# Patient Record
Sex: Female | Born: 1940
Health system: Southern US, Community
[De-identification: ages and names within clinical notes are randomized; demographics above are authoritative.]

## PROBLEM LIST (undated history)

## (undated) DIAGNOSIS — E785 Hyperlipidemia, unspecified: Secondary | ICD-10-CM

## (undated) DIAGNOSIS — G009 Bacterial meningitis, unspecified: Secondary | ICD-10-CM

## (undated) DIAGNOSIS — R0683 Snoring: Secondary | ICD-10-CM

## (undated) DIAGNOSIS — R0602 Shortness of breath: Secondary | ICD-10-CM

## (undated) DIAGNOSIS — I10 Essential (primary) hypertension: Secondary | ICD-10-CM

## (undated) DIAGNOSIS — M810 Age-related osteoporosis without current pathological fracture: Secondary | ICD-10-CM

## (undated) DIAGNOSIS — I4719 Other supraventricular tachycardia: Secondary | ICD-10-CM

## (undated) DIAGNOSIS — R200 Anesthesia of skin: Secondary | ICD-10-CM

## (undated) DIAGNOSIS — Z87898 Personal history of other specified conditions: Secondary | ICD-10-CM

## (undated) DIAGNOSIS — Z8719 Personal history of other diseases of the digestive system: Secondary | ICD-10-CM

## (undated) DIAGNOSIS — IMO0002 Reserved for concepts with insufficient information to code with codable children: Secondary | ICD-10-CM

## (undated) DIAGNOSIS — I6523 Occlusion and stenosis of bilateral carotid arteries: Secondary | ICD-10-CM

## (undated) DIAGNOSIS — I480 Paroxysmal atrial fibrillation: Secondary | ICD-10-CM

## (undated) DIAGNOSIS — N200 Calculus of kidney: Secondary | ICD-10-CM

## (undated) DIAGNOSIS — B360 Pityriasis versicolor: Secondary | ICD-10-CM

## (undated) DIAGNOSIS — D126 Benign neoplasm of colon, unspecified: Secondary | ICD-10-CM

## (undated) DIAGNOSIS — G43109 Migraine with aura, not intractable, without status migrainosus: Secondary | ICD-10-CM

## (undated) DIAGNOSIS — Z87448 Personal history of other diseases of urinary system: Secondary | ICD-10-CM

## (undated) DIAGNOSIS — M2550 Pain in unspecified joint: Secondary | ICD-10-CM

## (undated) DIAGNOSIS — G4733 Obstructive sleep apnea (adult) (pediatric): Secondary | ICD-10-CM

## (undated) DIAGNOSIS — R569 Unspecified convulsions: Secondary | ICD-10-CM

## (undated) DIAGNOSIS — I471 Supraventricular tachycardia: Secondary | ICD-10-CM

## (undated) HISTORY — PX: CHOLECYSTECTOMY: SHX55

## (undated) HISTORY — DX: Occlusion and stenosis of bilateral carotid arteries: I65.23

## (undated) HISTORY — DX: Calculus of kidney: N20.0

## (undated) HISTORY — DX: Reserved for concepts with insufficient information to code with codable children: IMO0002

## (undated) HISTORY — DX: Hyperlipidemia, unspecified: E78.5

## (undated) HISTORY — DX: Pityriasis versicolor: B36.0

## (undated) HISTORY — DX: Paroxysmal atrial fibrillation: I48.0

## (undated) HISTORY — DX: Supraventricular tachycardia: I47.1

## (undated) HISTORY — DX: Bacterial meningitis, unspecified: G00.9

## (undated) HISTORY — DX: Migraine with aura, not intractable, without status migrainosus: G43.109

## (undated) HISTORY — DX: Snoring: R06.83

## (undated) HISTORY — DX: Personal history of other diseases of urinary system: Z87.448

## (undated) HISTORY — DX: Pain in unspecified joint: M25.50

## (undated) HISTORY — DX: Personal history of other diseases of the digestive system: Z87.19

## (undated) HISTORY — DX: Unspecified convulsions: R56.9

## (undated) HISTORY — DX: Obstructive sleep apnea (adult) (pediatric): G47.33

## (undated) HISTORY — DX: Age-related osteoporosis without current pathological fracture: M81.0

## (undated) HISTORY — DX: Other supraventricular tachycardia: I47.19

## (undated) HISTORY — DX: Anesthesia of skin: R20.0

## (undated) HISTORY — PX: ABLATION: SHX5711

## (undated) HISTORY — DX: Shortness of breath: R06.02

## (undated) HISTORY — DX: Essential (primary) hypertension: I10

## (undated) HISTORY — PX: OTHER SURGICAL HISTORY: SHX169

## (undated) HISTORY — DX: Personal history of other specified conditions: Z87.898

## (undated) HISTORY — DX: Benign neoplasm of colon, unspecified: D12.6

---

## 1978-07-18 DIAGNOSIS — R569 Unspecified convulsions: Secondary | ICD-10-CM

## 1978-07-18 HISTORY — DX: Unspecified convulsions: R56.9

## 1998-05-14 ENCOUNTER — Ambulatory Visit (HOSPITAL_COMMUNITY): Admission: RE | Admit: 1998-05-14 | Discharge: 1998-05-14 | Payer: Self-pay | Admitting: Urology

## 1998-05-14 ENCOUNTER — Encounter: Payer: Self-pay | Admitting: Urology

## 2009-11-16 ENCOUNTER — Inpatient Hospital Stay (HOSPITAL_BASED_OUTPATIENT_CLINIC_OR_DEPARTMENT_OTHER): Admission: RE | Admit: 2009-11-16 | Discharge: 2009-11-16 | Payer: Self-pay | Admitting: Cardiology

## 2010-11-16 DIAGNOSIS — R200 Anesthesia of skin: Secondary | ICD-10-CM

## 2010-11-16 HISTORY — DX: Anesthesia of skin: R20.0

## 2010-12-10 ENCOUNTER — Ambulatory Visit (HOSPITAL_COMMUNITY): Payer: MEDICARE | Attending: Internal Medicine

## 2010-12-10 DIAGNOSIS — R209 Unspecified disturbances of skin sensation: Secondary | ICD-10-CM | POA: Insufficient documentation

## 2010-12-15 ENCOUNTER — Ambulatory Visit (HOSPITAL_BASED_OUTPATIENT_CLINIC_OR_DEPARTMENT_OTHER)
Admission: RE | Admit: 2010-12-15 | Discharge: 2010-12-15 | Disposition: A | Discharge status: Home / Self Care | Payer: MEDICARE | Source: Ambulatory Visit | Attending: Urology | Admitting: Urology

## 2010-12-15 ENCOUNTER — Ambulatory Visit (HOSPITAL_COMMUNITY): Payer: MEDICARE

## 2010-12-15 DIAGNOSIS — I4891 Unspecified atrial fibrillation: Secondary | ICD-10-CM | POA: Insufficient documentation

## 2010-12-15 DIAGNOSIS — I10 Essential (primary) hypertension: Secondary | ICD-10-CM | POA: Insufficient documentation

## 2010-12-15 DIAGNOSIS — E785 Hyperlipidemia, unspecified: Secondary | ICD-10-CM | POA: Insufficient documentation

## 2010-12-15 DIAGNOSIS — I771 Stricture of artery: Secondary | ICD-10-CM | POA: Insufficient documentation

## 2010-12-15 DIAGNOSIS — N201 Calculus of ureter: Secondary | ICD-10-CM | POA: Insufficient documentation

## 2010-12-15 DIAGNOSIS — R209 Unspecified disturbances of skin sensation: Secondary | ICD-10-CM | POA: Insufficient documentation

## 2010-12-15 DIAGNOSIS — Z7982 Long term (current) use of aspirin: Secondary | ICD-10-CM | POA: Insufficient documentation

## 2010-12-15 DIAGNOSIS — Z79899 Other long term (current) drug therapy: Secondary | ICD-10-CM | POA: Insufficient documentation

## 2010-12-15 LAB — POCT I-STAT 4, (NA,K, GLUC, HGB,HCT)
Glucose, Bld: 87 mg/dL (ref 70–99)
HCT: 43 % (ref 36.0–46.0)
Hemoglobin: 14.6 g/dL (ref 12.0–15.0)
Potassium: 4.5 mEq/L (ref 3.5–5.1)
Sodium: 142 mEq/L (ref 135–145)

## 2010-12-17 NOTE — Op Note (Signed)
  NAMEBEATRIS, Rhonda [name]               ACCOUNT NO.:  [id number]  MEDICAL RECORD NO.:  [id number]            PATIENT TYPE:  LOCATION:                                 FACILITY:  PHYSICIAN:  Brinden Kincheloe I. Patsi Sears, M.D.DATE OF BIRTH:  1941-07-03  DATE OF PROCEDURE:  12/15/2010 DATE OF DISCHARGE:                              OPERATIVE REPORT   PREOPERATIVE DIAGNOSIS:  Left ureteropelvic junction stone.  POSTOPERATIVE DIAGNOSIS:  Left mid-caliceal stone.  OPERATION:  Cystourethroscopy, left retrograde pyelogram with interpretation, left ureteral dilation and flexible ureteroscopy and basket extraction of mid-caliceal stone and left double-J stent (6- French x 24 cm).  HISTORY:  [name] is a 70 year old female, with microhematuria, and left UPJ stone which has not passed.  She is now for extraction.  PROCEDURE:  Cystourethroscopy was accomplished and left retrograde pyelogram was accomplished, which showed the stone at the left UPJ.  The stone was manipulated back into the mid pole calix.  Ureteral dilation was accomplished, and through the dilator and access sheath, the flexible digital scope was passed, and stone was identified in the mid calix and extracted.  Evaluation of all of the calices was accomplished, no stone was identified.  There was a lower pole calix with bleeding, but no stone was identified.  A double-J stent was then placed over guidewire, measuring 6-French x 24 cm.  The patient tolerated procedure well.  She received a B and O suppository at the beginning of procedure, and IV Toradol at the end of the procedure.  She was awakened, taken to recovery room in excellent condition.     [name] I. Patsi Sears, M.D.     SIT/MEDQ  D:  12/15/2010  T:  12/16/2010  Job:  [id number]  Electronically Signed by Jethro Bolus M.D. on 12/17/2010 01:24:06 PM

## 2013-01-16 ENCOUNTER — Other Ambulatory Visit: Payer: Self-pay | Admitting: Internal Medicine

## 2013-01-16 DIAGNOSIS — H532 Diplopia: Secondary | ICD-10-CM

## 2013-01-22 ENCOUNTER — Ambulatory Visit
Admission: RE | Admit: 2013-01-22 | Discharge: 2013-01-22 | Disposition: A | Discharge status: Home / Self Care | Payer: Self-pay | Source: Ambulatory Visit | Attending: Internal Medicine | Admitting: Internal Medicine

## 2013-01-22 DIAGNOSIS — H532 Diplopia: Secondary | ICD-10-CM

## 2014-01-01 ENCOUNTER — Encounter: Payer: Self-pay | Admitting: General Surgery

## 2014-01-01 ENCOUNTER — Ambulatory Visit: Payer: MEDICARE | Admitting: Cardiology

## 2014-01-01 DIAGNOSIS — I10 Essential (primary) hypertension: Secondary | ICD-10-CM | POA: Insufficient documentation

## 2014-01-01 DIAGNOSIS — I119 Hypertensive heart disease without heart failure: Secondary | ICD-10-CM

## 2014-01-01 DIAGNOSIS — R0602 Shortness of breath: Secondary | ICD-10-CM

## 2014-01-01 DIAGNOSIS — I4891 Unspecified atrial fibrillation: Secondary | ICD-10-CM

## 2014-01-01 DIAGNOSIS — I4819 Other persistent atrial fibrillation: Secondary | ICD-10-CM | POA: Insufficient documentation

## 2014-01-15 ENCOUNTER — Ambulatory Visit (INDEPENDENT_AMBULATORY_CARE_PROVIDER_SITE_OTHER): Payer: Medicare Other | Admitting: Cardiology

## 2014-01-15 ENCOUNTER — Encounter: Payer: Self-pay | Admitting: Cardiology

## 2014-01-15 VITALS — BP 130/82 | HR 62 | Ht 66.0 in | Wt 186.0 lb

## 2014-01-15 DIAGNOSIS — I10 Essential (primary) hypertension: Secondary | ICD-10-CM

## 2014-01-15 DIAGNOSIS — I480 Paroxysmal atrial fibrillation: Secondary | ICD-10-CM

## 2014-01-15 DIAGNOSIS — I471 Supraventricular tachycardia: Secondary | ICD-10-CM | POA: Insufficient documentation

## 2014-01-15 DIAGNOSIS — R0602 Shortness of breath: Secondary | ICD-10-CM

## 2014-01-15 DIAGNOSIS — R0789 Other chest pain: Secondary | ICD-10-CM

## 2014-01-15 DIAGNOSIS — I4891 Unspecified atrial fibrillation: Secondary | ICD-10-CM

## 2014-01-15 NOTE — Patient Instructions (Signed)
Your physician has recommended you make the following change in your medication: 1. D/C Lisinopril  Your physician has requested that you have an exercise tolerance test. For further information please visit [email or web address]. Please also follow instruction sheet, as given.  Your physician recommends that you schedule a follow-up appointment in: 6 Weeks with Dr Radford Pax

## 2014-01-15 NOTE — Progress Notes (Signed)
[place], [place], [place]  [address] Phone: [phone] Fax:  [phone]  Date:  01/15/2014   ID:  MORAYO LEVEN, DOB 1941-07-08, MRN [id number]  PCP:  Irven Shelling, MD  Cardiologist:  Fransico Him, MD     History of Present Illness: Rhonda [name] is a 73 y.o. female with a history of PAF s/p afib ablation, HTN, PVC's, PAC's and nonsustained atrial tachycardia who presents toady for followup.  She is doing well.  She denies any LE edema, palpitations or syncope.  She says that her BP has been up and down over the past few weeks.  She is concerned that she has been having some intermittent left arm pain.  It typically occurs at rest but is not exacerbated by movement.  She has had several episodes of SOB that are usually exacerbated when her BP gets too low.  She has had some problems with lightheadedness recently.  She brought in BP readings today which range from 92-150/60-7mmHg.     Wt Readings from Last 3 Encounters:  01/15/14 186 lb (84.369 kg)     Past Medical History  Diagnosis Date  . Hypertension   . PAF (paroxysmal atrial fibrillation)     radiofrequency ablation of afib bowman gray  . Hyperlipidemia   . Basilar migraine   . Atrial tachycardia     non sustatined with PVCs and PACs  . Osteoporosis     actonel since 2006  . Hx of chest pain     Atypical  . SOB (shortness of breath)     Chronic mild, negative cardiolite  . Seizures 1980    7 years on Dilantin  . History of hemorrhoids   . Snoring   . Tinea versicolor   . Nephrolithiasis   . Hx of hematuria     IVP 1990 left kidney stone, cystoscopy negative  . Cystocele     MIld  . Bacterial meningitis     as a child  . Adenomatous colon polyp     cecum, last colonoscopy 2006  . Right facial numbness 5/12    question TIA versus migraine,cardiac monitoring neg for AFIB    Current Outpatient Prescriptions  Medication Sig Dispense Refill  . acetaminophen (TYLENOL) 325 MG tablet  Take by mouth.      . ALPRAZolam (XANAX) 0.5 MG tablet Take 0.5 mg by mouth at bedtime as needed for anxiety.      Marland Kitchen aspirin 81 MG tablet Take 81 mg by mouth daily.      . calcium citrate-vitamin D (CITRACAL+D) 315-200 MG-UNIT per tablet Take by mouth.      . cycloSPORINE (RESTASIS) 0.05 % ophthalmic emulsion 1 drop Two (2) times a day.      . lisinopril (PRINIVIL,ZESTRIL) 2.5 MG tablet Take 2.5 mg by mouth daily.      Marland Kitchen omeprazole (PRILOSEC OTC) 20 MG tablet Take 20 mg by mouth as needed.      . ranitidine (ZANTAC) 150 MG tablet Take 150 mg by mouth 2 (two) times daily.       No current facility-administered medications for this visit.    Allergies:    Allergies  Allergen Reactions  . Erythromycin     Yeast infection    Social History:  The patient  reports that she has never smoked. She does not have any smokeless tobacco history on file. She reports that she drinks alcohol. She reports that she does not use illicit drugs.  Family History:  The patient's family history is not on file.   ROS:  Please see the history of present illness.      All other systems reviewed and negative.   PHYSICAL EXAM: VS:  BP 130/82  Pulse 62  Ht 5\' 6"  (1.676 m)  Wt 186 lb (84.369 kg)  BMI 30.04 kg/m2 Well nourished, well developed, in no acute distress HEENT: normal Neck: no JVD Cardiac:  normal S1, S2; RRR; no murmur Lungs:  clear to auscultation bilaterally, no wheezing, rhonchi or rales Abd: soft, nontender, no hepatomegaly Ext: no edema Skin: warm and dry Neuro:  CNs 2-12 intact, no focal abnormalities noted  EKG:     NSR  ASSESSMENT AND PLAN:  1. PAF s/p afib ablation with no reoccurence - continue ASA 2. PVC's 3. PAC's 4. Nonsustained atrial tacycardia 5. HTN well controlled and now with some low BP readings with dizziness - I will stop her Lisinopril 6.  Atypical symptoms of chest pain and SOB that only occurs at rest - unlikely to be related to CAD but since she is having  left arm pain intermittently I will get an ETT.  She had a nuclear stress test a year ago that was normal and her EKG is normal.     Followup with me in 6 weeks  Signed, Fransico Him, MD 01/15/2014 11:49 AM

## 2014-01-22 ENCOUNTER — Encounter: Payer: Self-pay | Admitting: Cardiology

## 2014-01-23 ENCOUNTER — Other Ambulatory Visit: Payer: Self-pay | Admitting: Internal Medicine

## 2014-01-23 DIAGNOSIS — H532 Diplopia: Secondary | ICD-10-CM

## 2014-01-23 DIAGNOSIS — I6350 Cerebral infarction due to unspecified occlusion or stenosis of unspecified cerebral artery: Secondary | ICD-10-CM

## 2014-01-30 ENCOUNTER — Ambulatory Visit
Admission: RE | Admit: 2014-01-30 | Discharge: 2014-01-30 | Disposition: A | Discharge status: Home / Self Care | Payer: Medicare Other | Source: Ambulatory Visit | Attending: Internal Medicine | Admitting: Internal Medicine

## 2014-01-30 DIAGNOSIS — I6350 Cerebral infarction due to unspecified occlusion or stenosis of unspecified cerebral artery: Secondary | ICD-10-CM

## 2014-01-30 DIAGNOSIS — H532 Diplopia: Secondary | ICD-10-CM

## 2014-02-19 ENCOUNTER — Ambulatory Visit (INDEPENDENT_AMBULATORY_CARE_PROVIDER_SITE_OTHER): Payer: Medicare Other | Admitting: Nurse Practitioner

## 2014-02-19 VITALS — BP 125/72 | HR 74

## 2014-02-19 DIAGNOSIS — R0789 Other chest pain: Secondary | ICD-10-CM

## 2014-02-19 NOTE — Progress Notes (Signed)
Exercise Treadmill Test  Pre-Exercise Testing Evaluation Rhythm: normal sinus  Rate: 67 bpm     Test  Exercise Tolerance Test Ordering MD: Fransico Him, MD  Interpreting MD: Truitt Merle, NP  Unique Test No: 1  Treadmill:  1  Indication for ETT: chest pain - rule out ischemia  Contraindication to ETT: No   Stress Modality: exercise - treadmill  Cardiac Imaging Performed: non   Protocol: standard Bruce - maximal  Max BP:  162/93  Max MPHR (bpm):  149 85% MPR (bpm):  127  MPHR obtained (bpm):  100 % MPHR obtained:  78%  Reached 85% MPHR (min:sec):  NA Total Exercise Time (min-sec):  4:51  Workload in METS:  6.5 Borg Scale: 15  Reason ETT Terminated:  patient's desire to stop    ST Segment Analysis At Rest: normal ST segments - no evidence of significant ST depression With Exercise: no evidence of significant ST depression  Other Information Arrhythmia:  No Angina during ETT:  absent (0) Quality of ETT:  non-diagnostic  ETT Interpretation:  Non-diagnostic.  Comments: Patient presents today for routine GXT. Has had some atypical chest/left arm pain. Has HTN with labile readings, HLD and positive FH. Negative Myoview in June 2014. Does not exercise routinely but has tried to start walking more recently without any symptoms. Currently with URI.  Today the patient exercised on a modified Bruce protocol for a total of 5:41 minutes.  Poor exercise tolerance.  Adequate blood pressure response.  Clinically negative for chest pain. Test was stopped due to fatigue.  EKG negative for ischemia but the study is non-diagnostic due to target HR not being achieved. No significant arrhythmia noted.   Recommendations: Walking at home daily.  See back later this month as planned.  Will hold on Myoview until seen back by Dr. Radford Pax or if she has return of symptoms.   Patient is agreeable to this plan and will call if any problems develop in the interim.   Burtis Junes, RN, [facility] [address] [place], [place]  [address] [phone]

## 2014-03-13 ENCOUNTER — Encounter: Payer: Self-pay | Admitting: Cardiology

## 2014-03-13 ENCOUNTER — Ambulatory Visit (INDEPENDENT_AMBULATORY_CARE_PROVIDER_SITE_OTHER): Payer: Medicare Other | Admitting: Cardiology

## 2014-03-13 VITALS — BP 114/76 | HR 73 | Ht 66.0 in | Wt 176.8 lb

## 2014-03-13 DIAGNOSIS — R0789 Other chest pain: Secondary | ICD-10-CM

## 2014-03-13 DIAGNOSIS — I10 Essential (primary) hypertension: Secondary | ICD-10-CM

## 2014-03-13 DIAGNOSIS — I4891 Unspecified atrial fibrillation: Secondary | ICD-10-CM

## 2014-03-13 DIAGNOSIS — I480 Paroxysmal atrial fibrillation: Secondary | ICD-10-CM

## 2014-03-13 NOTE — Patient Instructions (Signed)
Your physician recommends that you continue on your current medications as directed. Please refer to the Current Medication list given to you today.  Your physician wants you to follow-up in: 6 months with Dr Turner You will receive a reminder letter in the mail two months in advance. If you don't receive a letter, please call our office to schedule the follow-up appointment.  

## 2014-03-13 NOTE — Progress Notes (Signed)
[place], [place], [place]  [address] Phone: [phone] Fax:  [phone]  Date:  03/13/2014   ID:  Rhonda [name], DOB 1940-10-31, MRN [id number]  PCP:  Irven Shelling, MD  Cardiologist:  Fransico Him, MD     History of Present Illness:  Rhonda [name] is a 73 y.o. female with a history of PAF s/p afib ablation, HTN, PVC's, PAC's and nonsustained atrial tachycardia who presents toady for followup. She is doing well. She denies any LE edema, palpitations or syncope. Since I saw her last she has lost 10 pounds.  She denies any chest pain, SOB or DOE.  She walks on the treadmill for 10 minutes and bike for 10 minutes due to knee pain.   Wt Readings from Last 3 Encounters:  03/13/14 176 lb 12.8 oz (80.196 kg)  01/15/14 186 lb (84.369 kg)     Past Medical History  Diagnosis Date  . Hypertension   . PAF (paroxysmal atrial fibrillation)     radiofrequency ablation of afib bowman gray  . Hyperlipidemia   . Basilar migraine   . Atrial tachycardia     non sustatined with PVCs and PACs  . Osteoporosis     actonel since 2006  . Hx of chest pain     Atypical  . SOB (shortness of breath)     Chronic mild, negative cardiolite  . Seizures 1980    7 years on Dilantin  . History of hemorrhoids   . Snoring   . Tinea versicolor   . Nephrolithiasis   . Hx of hematuria     IVP 1990 left kidney stone, cystoscopy negative  . Cystocele     MIld  . Bacterial meningitis     as a child  . Adenomatous colon polyp     cecum, last colonoscopy 2006  . Right facial numbness 5/12    question TIA versus migraine,cardiac monitoring neg for AFIB    Current Outpatient Prescriptions  Medication Sig Dispense Refill  . acetaminophen (TYLENOL) 325 MG tablet Take by mouth.      . ALPRAZolam (XANAX) 0.5 MG tablet Take 0.5 mg by mouth at bedtime as needed for anxiety. USES FOR SLEEP      . aspirin 81 MG tablet Take 81 mg by mouth daily.      . calcium citrate-vitamin D  (CITRACAL+D) 315-200 MG-UNIT per tablet Take by mouth.      . cycloSPORINE (RESTASIS) 0.05 % ophthalmic emulsion 1 drop Two (2) times a day.      . DiphenhydrAMINE HCl (BENADRYL PO) Take by mouth. FOR SLEEP      . Multiple Vitamin (MULTIVITAMIN) tablet Take 1 tablet by mouth daily. With vitamin D      . omeprazole (PRILOSEC OTC) 20 MG tablet Take 20 mg by mouth as needed.      . pravastatin (PRAVACHOL) 40 MG tablet Take 40 mg by mouth daily.      . Pseudoephedrine HCl (SUDAFED PO) Take by mouth as needed. For Allergies      . ranitidine (ZANTAC) 150 MG tablet Take 150 mg by mouth 2 (two) times daily.       No current facility-administered medications for this visit.    Allergies:    Allergies  Allergen Reactions  . Erythromycin     Yeast infection    Social History:  The patient  reports that she has never smoked. She does not have any smokeless tobacco history on file. She  reports that she drinks alcohol. She reports that she does not use illicit drugs.   Family History:  The patient's family history is not on file.   ROS:  Please see the history of present illness.      All other systems reviewed and negative.   PHYSICAL EXAM: VS:  BP 114/76  Pulse 73  Ht 5\' 6"  (1.676 m)  Wt 176 lb 12.8 oz (80.196 kg)  BMI 28.55 kg/m2  SpO2 98% Well nourished, well developed, in no acute distress HEENT: normal Neck: no JVD Cardiac:  normal S1, S2; RRR; no murmur Lungs:  clear to auscultation bilaterally, no wheezing, rhonchi or rales Abd: soft, nontender, no hepatomegaly Ext: no edema Skin: warm and dry Neuro:  CNs 2-12 intact, no focal abnormalities noted  ASSESSMENT AND PLAN:  1. PAF s/p afib ablation with no reoccurence - continue ASA  2. PVC's 3. PAC's 4. Nonsustained atrial tachycardia  5. HTN well controlled off Lisinopril       6.   Atypical symptoms of chest pain and SOB with no ischemia on  ETT but patient did not achieve her maximum 85% pred HR target.  Her symptoms of  chest pain and SOB have resolved with weight loss.  No further w/u at this time unless she starts to have CP or SOB.  Followup with me in 6 months       Signed, Fransico Him, MD 03/13/2014 2:55 PM

## 2014-03-14 ENCOUNTER — Ambulatory Visit: Payer: Medicare Other | Admitting: Cardiology

## 2014-07-08 ENCOUNTER — Encounter: Payer: Self-pay | Admitting: Cardiology

## 2014-09-09 ENCOUNTER — Ambulatory Visit (INDEPENDENT_AMBULATORY_CARE_PROVIDER_SITE_OTHER): Payer: Medicare Other | Admitting: Cardiology

## 2014-09-09 ENCOUNTER — Encounter: Payer: Self-pay | Admitting: Cardiology

## 2014-09-09 VITALS — BP 110/68 | HR 74 | Ht 66.0 in | Wt 175.4 lb

## 2014-09-09 DIAGNOSIS — I10 Essential (primary) hypertension: Secondary | ICD-10-CM

## 2014-09-09 DIAGNOSIS — I471 Supraventricular tachycardia: Secondary | ICD-10-CM

## 2014-09-09 DIAGNOSIS — I480 Paroxysmal atrial fibrillation: Secondary | ICD-10-CM

## 2014-09-09 NOTE — Patient Instructions (Signed)
Your physician recommends that you continue on your current medications as directed. Please refer to the Current Medication list given to you today.  Your physician wants you to follow-up in: 6 months with Dr Turner You will receive a reminder letter in the mail two months in advance. If you don't receive a letter, please call our office to schedule the follow-up appointment.  

## 2014-09-09 NOTE — Progress Notes (Signed)
Cardiology Office Note   Date:  09/09/2014   ID:  Rhonda [name], DOB [birth year], MRN [id number]  PCP:  Irven Shelling, MD  Cardiologist:   Sueanne Margarita, MD   No chief complaint on file.     History of Present Illness: Rhonda [name] is a 74 y.o. female with a history of PAF s/p afib ablation, HTN, PVC's, PAC's and nonsustained atrial tachycardia who presents toady for followup. She is doing well. She denies any LE edema, dizziness or syncope.  She denies any chest pain, SOB or DOE. She occasionally has a skipped heart beat.  She used to walks on the treadmill for 10 minutes and bike for 10 minutes but got busy over the holidays and stopped and is planning on getting back into exercise.  Past Medical History  Diagnosis Date  . Hypertension   . PAF (paroxysmal atrial fibrillation)     radiofrequency ablation of afib bowman gray  . Hyperlipidemia   . Basilar migraine   . Atrial tachycardia     non sustatined with PVCs and PACs  . Osteoporosis     actonel since 2006  . Hx of chest pain     Atypical  . SOB (shortness of breath)     Chronic mild, negative cardiolite  . Seizures 1980    7 years on Dilantin  . History of hemorrhoids   . Snoring   . Tinea versicolor   . Nephrolithiasis   . Hx of hematuria     IVP 1990 left kidney stone, cystoscopy negative  . Cystocele     MIld  . Bacterial meningitis     as a child  . Adenomatous colon polyp     cecum, last colonoscopy 2006  . Right facial numbness 5/12    question TIA versus migraine,cardiac monitoring neg for AFIB    Past Surgical History  Procedure Laterality Date  . Multiple ankle surgeries       Current Outpatient Prescriptions  Medication Sig Dispense Refill  . acetaminophen (TYLENOL) 325 MG tablet Take by mouth.    . ALPRAZolam (XANAX) 0.5 MG tablet Take 0.5 mg by mouth at bedtime as needed for anxiety. USES FOR SLEEP    . aspirin 81 MG tablet Take 81 mg by mouth daily.    . calcium  citrate-vitamin D (CITRACAL+D) 315-200 MG-UNIT per tablet Take by mouth.    . cycloSPORINE (RESTASIS) 0.05 % ophthalmic emulsion 1 drop Two (2) times a day.    . DiphenhydrAMINE HCl (BENADRYL PO) Take by mouth. FOR SLEEP    . Multiple Vitamin (MULTIVITAMIN) tablet Take 1 tablet by mouth daily. With vitamin D    . omeprazole (PRILOSEC OTC) 20 MG tablet Take 20 mg by mouth as needed.    . pravastatin (PRAVACHOL) 40 MG tablet Take 40 mg by mouth daily.    . Pseudoephedrine HCl (SUDAFED PO) Take by mouth as needed. For Allergies    . ranitidine (ZANTAC) 150 MG tablet Take 150 mg by mouth 2 (two) times daily.     No current facility-administered medications for this visit.    Allergies:   Erythromycin    Social History:  The patient  reports that she has never smoked. She does not have any smokeless tobacco history on file. She reports that she drinks alcohol. She reports that she does not use illicit drugs.   Family History:  The patient's family history includes Heart attack in her father; Parkinson's disease in her mother.  ROS:  Please see the history of present illness.   Otherwise, review of systems are positive for none.   All other systems are reviewed and negative.    PHYSICAL EXAM: VS:  BP 110/68 mmHg  Pulse 74  Ht 5\' 6"  (1.676 m)  Wt 175 lb 6.4 oz (79.561 kg)  BMI 28.32 kg/m2  SpO2 99% , BMI Body mass index is 28.32 kg/(m^2). GEN: Well nourished, well developed, in no acute distress HEENT: normal Neck: no JVD, carotid bruits, or masses Cardiac: RRR; no murmurs, rubs, or gallops,no edema  Respiratory:  clear to auscultation bilaterally, normal work of breathing GI: soft, nontender, nondistended, + BS MS: no deformity or atrophy Skin: warm and dry, no rash Neuro:  Strength and sensation are intact Psych: euthymic mood, full affect   EKG:  EKG is not ordered today.    Recent Labs: No results found for requested labs within last 365 days.    Lipid Panel No  results found for: CHOL, TRIG, HDL, CHOLHDL, VLDL, LDLCALC, LDLDIRECT    Wt Readings from Last 3 Encounters:  09/09/14 175 lb 6.4 oz (79.561 kg)  03/13/14 176 lb 12.8 oz (80.196 kg)  01/15/14 186 lb (84.369 kg)    ASSESSMENT AND PLAN:  1. PAF s/p afib ablation with no reoccurence - continue ASA  2. PVC's 3. PAC's 4. Nonsustained atrial tachycardia 5. HTN well controlled    Current medicines are reviewed at length with the patient today.  The patient does not have concerns regarding medicines.  The following changes have been made:  no change  Labs/ tests ordered today include: None  No orders of the defined types were placed in this encounter.     Disposition:   FU with me in 6 months   Signed, Sueanne Margarita, MD  09/09/2014 11:48 AM    [facility] [place], [place], [place]  [address] Phone: [phone]; Fax: [phone]

## 2014-12-18 ENCOUNTER — Encounter: Payer: Self-pay | Admitting: Cardiology

## 2015-03-23 NOTE — Progress Notes (Signed)
Cardiology Office Note   Date:  03/24/2015   ID:  Rhonda [name], DOB 1941-03-20, MRN [id number]  PCP:  Irven Shelling, MD    Chief Complaint  Patient presents with  . PAF      History of Present Illness: Rhonda [name] is a 74 y.o. female with a history of PAF s/p afib ablation, HTN, PVC's, PAC's and nonsustained atrial tachycardia who presents today for followup. She is doing well. She denies any LE edema, or syncope. She denies any SOB or DOE except when she is having sinus problems.   She occasionally has a skipped heart beat. About 4 months ago she awakened with some hard chest pressure that lasted about 15 minutes and resolved and she developed afib that lasted for about 12 hours. She had some anxiety associated with it and thinks she got sweaty.  She has not had any since then.  She gets dizzy when she stands up some and thinks it has gotten worse since then. SHe has had this for years but thinks it has gotten worse.  She says that this is not vertigo.    Past Medical History  Diagnosis Date  . Hypertension   . PAF (paroxysmal atrial fibrillation)     radiofrequency ablation of afib bowman gray  . Hyperlipidemia   . Basilar migraine   . Atrial tachycardia     non sustatined with PVCs and PACs  . Osteoporosis     actonel since 2006  . Hx of chest pain     Atypical  . SOB (shortness of breath)     Chronic mild, negative cardiolite  . Seizures 1980    7 years on Dilantin  . History of hemorrhoids   . Snoring   . Tinea versicolor   . Nephrolithiasis   . Hx of hematuria     IVP 1990 left kidney stone, cystoscopy negative  . Cystocele     MIld  . Bacterial meningitis     as a child  . Adenomatous colon polyp     cecum, last colonoscopy 2006  . Right facial numbness 5/12    question TIA versus migraine,cardiac monitoring neg for AFIB    Past Surgical History  Procedure Laterality Date  . Multiple ankle surgeries       Current  Outpatient Prescriptions  Medication Sig Dispense Refill  . acetaminophen (TYLENOL) 325 MG tablet Take 650 mg by mouth every 6 (six) hours as needed for mild pain.    Marland Kitchen ALPRAZolam (XANAX) 0.5 MG tablet Take 0.5 mg by mouth at bedtime as needed for anxiety. USES FOR SLEEP    . aspirin 81 MG tablet Take 81 mg by mouth daily.    . calcium citrate-vitamin D (CITRACAL+D) 315-200 MG-UNIT per tablet Take 1 tablet by mouth daily.    . cycloSPORINE (RESTASIS) 0.05 % ophthalmic emulsion Place 1 drop into both eyes 2 (two) times daily.    . DiphenhydrAMINE HCl (BENADRYL PO) Take by mouth. FOR SLEEP    . doxycycline (VIBRAMYCIN) 100 MG capsule Take 100 mg by mouth 2 (two) times daily.    Marland Kitchen ibuprofen (ADVIL,MOTRIN) 200 MG tablet Take 400 mg by mouth at bedtime.    . Multiple Vitamin (MULTIVITAMIN) tablet Take 1 tablet by mouth daily. With vitamin D    . omeprazole (PRILOSEC OTC) 20 MG tablet Take 20 mg by mouth as needed.    Marland Kitchen  pravastatin (PRAVACHOL) 40 MG tablet Take 40 mg by mouth daily.    . Pseudoephedrine HCl (SUDAFED PO) Take by mouth as needed. For Allergies    . ranitidine (ZANTAC) 150 MG tablet Take 150 mg by mouth 2 (two) times daily.     No current facility-administered medications for this visit.    Allergies:   Erythromycin    Social History:  The patient  reports that she has never smoked. She does not have any smokeless tobacco history on file. She reports that she drinks alcohol. She reports that she does not use illicit drugs.   Family History:  The patient's family history includes Heart attack in her father; Parkinson's disease in her mother.    ROS:  Please see the history of present illness.   Otherwise, review of systems are positive for none.   All other systems are reviewed and negative.    PHYSICAL EXAM: VS:  BP 110/72 mmHg  Pulse 67  Ht 5\' 6"  (1.676 m)  Wt 177 lb 6.4 oz (80.468 kg)  BMI 28.65 kg/m2 , BMI Body mass index is 28.65 kg/(m^2). GEN: Well nourished, well  developed, in no acute distress HEENT: normal Neck: no JVD, carotid bruits, or masses Cardiac: RRR; no murmurs, rubs, or gallops,no edema  Respiratory:  clear to auscultation bilaterally, normal work of breathing GI: soft, nontender, nondistended, + BS MS: no deformity or atrophy Skin: warm and dry, no rash Neuro:  Strength and sensation are intact Psych: euthymic mood, full affect   EKG:  EKG was ordered today and showed NSR with no ST changes    Recent Labs: No results found for requested labs within last 365 days.    Lipid Panel No results found for: CHOL, TRIG, HDL, CHOLHDL, VLDL, LDLCALC, LDLDIRECT    Wt Readings from Last 3 Encounters:  03/24/15 177 lb 6.4 oz (80.468 kg)  09/09/14 175 lb 6.4 oz (79.561 kg)  03/13/14 176 lb 12.8 oz (80.196 kg)     ASSESSMENT AND PLAN:  1. PAF s/p afib ablation with only one episode of palpitations since I saw her last.  I have asked her to let me know if she has palpitations that last longer than a few minutes so we can get an EKG to see if she is having PAF reoccurrence. She does not want to wear a heart monitor at this time since they are very infrequent..  - continue ASA  2. PVC's 3. PAC's 4. Nonsustained atrial tachycardia 5. HTN well controlled    Current medicines are reviewed at length with the patient today.  The patient does not have concerns regarding medicines.  The following changes have been made:  no change  Labs/ tests ordered today: See above Assessment and Plan No orders of the defined types were placed in this encounter.     Disposition:   FU with me in 6 months  Signed, Sueanne Margarita, MD  03/24/2015 10:20 AM    [facility] [place], [place], [place]  [address] Phone: [phone]; Fax: [phone]

## 2015-03-24 ENCOUNTER — Ambulatory Visit (INDEPENDENT_AMBULATORY_CARE_PROVIDER_SITE_OTHER): Payer: Medicare Other | Admitting: Cardiology

## 2015-03-24 ENCOUNTER — Encounter: Payer: Self-pay | Admitting: Cardiology

## 2015-03-24 VITALS — BP 110/72 | HR 67 | Ht 66.0 in | Wt 177.4 lb

## 2015-03-24 DIAGNOSIS — R079 Chest pain, unspecified: Secondary | ICD-10-CM

## 2015-03-24 DIAGNOSIS — I10 Essential (primary) hypertension: Secondary | ICD-10-CM | POA: Diagnosis not present

## 2015-03-24 DIAGNOSIS — I471 Supraventricular tachycardia: Secondary | ICD-10-CM | POA: Diagnosis not present

## 2015-03-24 DIAGNOSIS — I4719 Other supraventricular tachycardia: Secondary | ICD-10-CM

## 2015-03-24 DIAGNOSIS — I480 Paroxysmal atrial fibrillation: Secondary | ICD-10-CM | POA: Diagnosis not present

## 2015-03-24 NOTE — Patient Instructions (Signed)
Medication Instructions:  Your physician recommends that you continue on your current medications as directed. Please refer to the Current Medication list given to you today.   Labwork: None  Testing/Procedures: Your physician has requested that you have a lexiscan myoview. For further information please visit [email or web address]. Please follow instruction sheet, as given.  Follow-Up: Your physician wants you to follow-up in: 6 months with Dr. Turner. You will receive a reminder letter in the mail two months in advance. If you don't receive a letter, please call our office to schedule the follow-up appointment.   Any Other Special Instructions Will Be Listed Below (If Applicable).   

## 2015-04-02 ENCOUNTER — Encounter (HOSPITAL_COMMUNITY): Payer: Medicare Other

## 2015-04-02 ENCOUNTER — Telehealth (HOSPITAL_COMMUNITY): Payer: Self-pay

## 2015-04-02 NOTE — Telephone Encounter (Signed)
Unable to Leave a message on voicemail due to no recorder in reference to upcoming appointment scheduled for 04-07-2015. Phone number given for a call back so details instructions can be given. Oletta Lamas, Anny Sayler A

## 2015-04-06 ENCOUNTER — Telehealth (HOSPITAL_COMMUNITY): Payer: Self-pay | Admitting: *Deleted

## 2015-04-06 NOTE — Telephone Encounter (Signed)
Patient given detailed instructions per Myocardial Perfusion Study Information Sheet for test on 04/07/15 at 0900. Patient notified to arrive 15 minutes early and that it is imperative to arrive on time for appointment to keep from having the test rescheduled.  If you need to cancel or reschedule your appointment, please call the office within 24 hours of your appointment. Failure to do so may result in a cancellation of your appointment, and a $50 no show fee. Patient verbalized understanding. Hasspacher, Ranae Palms

## 2015-04-07 ENCOUNTER — Ambulatory Visit (HOSPITAL_COMMUNITY): Payer: Medicare Other | Attending: Cardiology

## 2015-04-07 DIAGNOSIS — I10 Essential (primary) hypertension: Secondary | ICD-10-CM | POA: Diagnosis not present

## 2015-04-07 DIAGNOSIS — R9439 Abnormal result of other cardiovascular function study: Secondary | ICD-10-CM | POA: Insufficient documentation

## 2015-04-07 DIAGNOSIS — R079 Chest pain, unspecified: Secondary | ICD-10-CM | POA: Diagnosis not present

## 2015-04-07 DIAGNOSIS — R002 Palpitations: Secondary | ICD-10-CM | POA: Diagnosis not present

## 2015-04-07 LAB — MYOCARDIAL PERFUSION IMAGING
CHL CUP NUCLEAR SDS: 2
CHL CUP NUCLEAR SRS: 0
CHL CUP RESTING HR STRESS: 67 {beats}/min
LV dias vol: 90 mL
LV sys vol: 28 mL
NUC STRESS TID: 0.99
Peak HR: 83 {beats}/min
RATE: 0.32
SSS: 2

## 2015-04-07 MED ORDER — TECHNETIUM TC 99M SESTAMIBI GENERIC - CARDIOLITE
32.5000 | Freq: Once | INTRAVENOUS | Status: AC | PRN
  Administered 2015-04-07: 33 via INTRAVENOUS

## 2015-04-07 MED ORDER — REGADENOSON 0.4 MG/5ML IV SOLN
0.4000 mg | Freq: Once | INTRAVENOUS | Status: AC
  Administered 2015-04-07: 0.4 mg via INTRAVENOUS

## 2015-04-07 MED ORDER — TECHNETIUM TC 99M SESTAMIBI GENERIC - CARDIOLITE
10.6000 | Freq: Once | INTRAVENOUS | Status: AC | PRN
  Administered 2015-04-07: 11 via INTRAVENOUS

## 2015-04-09 ENCOUNTER — Encounter: Payer: Self-pay | Admitting: Cardiology

## 2015-04-09 ENCOUNTER — Telehealth: Payer: Self-pay | Admitting: Cardiology

## 2015-04-09 NOTE — Telephone Encounter (Signed)
Informed patient of results and verbal understanding expressed.  

## 2015-04-09 NOTE — Telephone Encounter (Signed)
Pt rtn call re results-pls call [phone]

## 2015-04-09 NOTE — Telephone Encounter (Signed)
-----   Message from Rhonda Margarita, MD sent at 04/07/2015 10:23 PM EDT ----- Please let patient know that stress test was fine

## 2015-04-09 NOTE — Telephone Encounter (Signed)
This encounter was created in error - please disregard.

## 2015-04-09 NOTE — Telephone Encounter (Signed)
New problem    Pt is returning your call from yesterday.

## 2015-10-13 NOTE — Progress Notes (Signed)
Cardiology Office Note   Date:  10/14/2015   ID:  Rhonda [name], DOB 1940/11/01, MRN [id number]  PCP:  Irven Shelling, MD    Chief Complaint  Patient presents with  . Atrial Fibrillation  . Hypertension      History of Present Illness: Rhonda [name] is a 75 y.o. female with a history of PAF s/p afib ablation, HTN, PVC's, PAC's and nonsustained atrial tachycardia who presents today for followup. She is doing well. She denies any LE edema, or syncope. She denies any SOB or DOE. She occasionally has a skipped heart beat or a fluttering. Occasionally she will have some dizziness.    Past Medical History  Diagnosis Date  . Hypertension   . PAF (paroxysmal atrial fibrillation) ([place])     radiofrequency ablation of afib bowman gray  . Hyperlipidemia   . Basilar migraine   . Atrial tachycardia ([place])     non sustatined with PVCs and PACs  . Osteoporosis     actonel since 2006  . Hx of chest pain     Atypical  . SOB (shortness of breath)     Chronic mild, negative cardiolite  . Seizures ([place]) 1980    7 years on Dilantin  . History of hemorrhoids   . Snoring   . Tinea versicolor   . Nephrolithiasis   . Hx of hematuria     IVP 1990 left kidney stone, cystoscopy negative  . Cystocele     MIld  . Bacterial meningitis     as a child  . Adenomatous colon polyp     cecum, last colonoscopy 2006  . Right facial numbness 5/12    question TIA versus migraine,cardiac monitoring neg for AFIB    Past Surgical History  Procedure Laterality Date  . Multiple ankle surgeries       Current Outpatient Prescriptions  Medication Sig Dispense Refill  . acetaminophen (TYLENOL) 325 MG tablet Take 650 mg by mouth every 6 (six) hours as needed for mild pain.    Marland Kitchen ALPRAZolam (XANAX) 0.5 MG tablet Take 0.5 mg by mouth at bedtime as needed for anxiety. USES FOR SLEEP    . aspirin 81 MG tablet Take 81 mg by mouth daily.    . calcium citrate-vitamin D  (CITRACAL+D) 315-200 MG-UNIT per tablet Take 1 tablet by mouth daily.    . cycloSPORINE (RESTASIS) 0.05 % ophthalmic emulsion Place 1 drop into both eyes 2 (two) times daily.    . DiphenhydrAMINE HCl (BENADRYL PO) Take by mouth. FOR SLEEP    . ibuprofen (ADVIL,MOTRIN) 200 MG tablet Take 400 mg by mouth at bedtime.    . Multiple Vitamin (MULTIVITAMIN) tablet Take 1 tablet by mouth daily. With vitamin D    . Naproxen Sodium (ALEVE) 220 MG CAPS Take 220 mg by mouth as needed.    Marland Kitchen omeprazole (PRILOSEC OTC) 20 MG tablet Take 20 mg by mouth as needed.    . pravastatin (PRAVACHOL) 40 MG tablet Take 40 mg by mouth daily.    . Pseudoephedrine HCl (SUDAFED PO) Take by mouth as needed. For Allergies    . ranitidine (ZANTAC) 150 MG tablet Take 150 mg by mouth 2 (two) times daily.     No current facility-administered medications for this visit.    Allergies:   Erythromycin    Social History:  The patient  reports that she has never smoked. She  does not have any smokeless tobacco history on file. She reports that she drinks alcohol. She reports that she does not use illicit drugs.   Family History:  The patient's family history includes Heart attack in her father; Parkinson's disease in her mother.    ROS:  Please see the history of present illness.   Otherwise, review of systems are positive for none.   All other systems are reviewed and negative.    PHYSICAL EXAM: VS:  BP 124/76 mmHg  Pulse 68  Ht 5\' 6"  (1.676 m)  Wt 184 lb (83.462 kg)  BMI 29.71 kg/m2 , BMI Body mass index is 29.71 kg/(m^2). GEN: Well nourished, well developed, in no acute distress HEENT: normal Neck: no JVD, carotid bruits, or masses Cardiac: RRR; no murmurs, rubs, or gallops,no edema  Respiratory:  clear to auscultation bilaterally, normal work of breathing GI: soft, nontender, nondistended, + BS MS: no deformity or atrophy Skin: warm and dry, no rash Neuro:  Strength and sensation are intact Psych: euthymic mood,  full affect   EKG:  EKG is not ordered today.    Recent Labs: No results found for requested labs within last 365 days.    Lipid Panel No results found for: CHOL, TRIG, HDL, CHOLHDL, VLDL, LDLCALC, LDLDIRECT    Wt Readings from Last 3 Encounters:  10/14/15 184 lb (83.462 kg)  03/24/15 177 lb 6.4 oz (80.468 kg)  09/09/14 175 lb 6.4 oz (79.561 kg)      ASSESSMENT AND PLAN:        1.   PAF s/p afib ablation maintaining NSR but having some fluttering in her chest.  She says that there have been a few times where her BP cuff would not register the reading of her HR.  I have recommended that we get a 30 day heart monitor to rule out PAF.   - continue ASA  2. PVC's 3. PAC's 4. Nonsustained atrial tachycardia 5. HTN well controlled   Current medicines are reviewed at length with the patient today.  The patient does not have concerns regarding medicines.  The following changes have been made:  no change  Labs/ tests ordered today: See above Assessment and Plan No orders of the defined types were placed in this encounter.     Disposition:   FU with me in 1 year  Signed, Sueanne Margarita, MD  10/14/2015 11:10 AM    [facility] [place], [place], [place]  [address] Phone: [phone]; Fax: [phone]

## 2015-10-14 ENCOUNTER — Encounter: Payer: Self-pay | Admitting: Cardiology

## 2015-10-14 ENCOUNTER — Ambulatory Visit (INDEPENDENT_AMBULATORY_CARE_PROVIDER_SITE_OTHER): Payer: Medicare Other | Admitting: Cardiology

## 2015-10-14 ENCOUNTER — Encounter (INDEPENDENT_AMBULATORY_CARE_PROVIDER_SITE_OTHER): Payer: Self-pay

## 2015-10-14 VITALS — BP 124/76 | HR 68 | Ht 66.0 in | Wt 184.0 lb

## 2015-10-14 DIAGNOSIS — I471 Supraventricular tachycardia: Secondary | ICD-10-CM | POA: Diagnosis not present

## 2015-10-14 DIAGNOSIS — R002 Palpitations: Secondary | ICD-10-CM | POA: Diagnosis not present

## 2015-10-14 DIAGNOSIS — I4719 Other supraventricular tachycardia: Secondary | ICD-10-CM

## 2015-10-14 DIAGNOSIS — I10 Essential (primary) hypertension: Secondary | ICD-10-CM | POA: Diagnosis not present

## 2015-10-14 DIAGNOSIS — I480 Paroxysmal atrial fibrillation: Secondary | ICD-10-CM | POA: Diagnosis not present

## 2015-10-14 NOTE — Patient Instructions (Signed)
Medication Instructions:  Your physician recommends that you continue on your current medications as directed. Please refer to the Current Medication list given to you today.   Labwork: None  Testing/Procedures: Your physician has recommended that you wear an event monitor. Event monitors are medical devices that record the heart's electrical activity. Doctors most often us these monitors to diagnose arrhythmias. Arrhythmias are problems with the speed or rhythm of the heartbeat. The monitor is a small, portable device. You can wear one while you do your normal daily activities. This is usually used to diagnose what is causing palpitations/syncope (passing out).  Follow-Up: Your physician wants you to follow-up in: 1 year with Dr. Turner. You will receive a reminder letter in the mail two months in advance. If you don't receive a letter, please call our office to schedule the follow-up appointment.   Any Other Special Instructions Will Be Listed Below (If Applicable).     If you need a refill on your cardiac medications before your next appointment, please call your pharmacy.   

## 2015-10-15 ENCOUNTER — Ambulatory Visit (INDEPENDENT_AMBULATORY_CARE_PROVIDER_SITE_OTHER): Payer: Medicare Other

## 2015-10-15 DIAGNOSIS — R002 Palpitations: Secondary | ICD-10-CM | POA: Diagnosis not present

## 2015-11-24 ENCOUNTER — Telehealth: Payer: Self-pay

## 2015-11-24 DIAGNOSIS — I471 Supraventricular tachycardia: Secondary | ICD-10-CM

## 2015-11-24 MED ORDER — METOPROLOL SUCCINATE ER 25 MG PO TB24: 25.0000 mg | ORAL_TABLET | Freq: Every day | ORAL | Status: DC

## 2015-11-24 NOTE — Telephone Encounter (Signed)
Informed patient of results and verbal understanding expressed.  Instructed patient to START TOPROL 25 mg daily and to avoid pseudoephedrine. ECHO ordered for scheduling. Patient agrees with treatment plan.

## 2015-11-24 NOTE — Telephone Encounter (Signed)
-----   Message from Sueanne Margarita, MD sent at 11/22/2015  4:08 PM EDT ----- Heart monitor showed normal rhythm with PACs, PVC's and wide complex tachycardia that I suspect is atrial tachycardia with aberration but cannot rule out VT.  Stress test showed no ischemia 04/2015.  Please get echo to assess for LVF.  Add Toprol XL 25mg  daily.  Tell her to avoid pseudoephedrine for her allergies.

## 2015-12-29 ENCOUNTER — Other Ambulatory Visit: Payer: Self-pay

## 2015-12-29 ENCOUNTER — Ambulatory Visit (HOSPITAL_COMMUNITY): Payer: Medicare Other | Attending: Internal Medicine

## 2015-12-29 DIAGNOSIS — I5189 Other ill-defined heart diseases: Secondary | ICD-10-CM | POA: Diagnosis not present

## 2015-12-29 DIAGNOSIS — I351 Nonrheumatic aortic (valve) insufficiency: Secondary | ICD-10-CM | POA: Insufficient documentation

## 2015-12-29 DIAGNOSIS — R9431 Abnormal electrocardiogram [ECG] [EKG]: Secondary | ICD-10-CM | POA: Diagnosis present

## 2015-12-29 DIAGNOSIS — I071 Rheumatic tricuspid insufficiency: Secondary | ICD-10-CM | POA: Insufficient documentation

## 2015-12-29 DIAGNOSIS — I471 Supraventricular tachycardia: Secondary | ICD-10-CM | POA: Diagnosis not present

## 2015-12-29 DIAGNOSIS — I340 Nonrheumatic mitral (valve) insufficiency: Secondary | ICD-10-CM | POA: Diagnosis not present

## 2015-12-29 DIAGNOSIS — I358 Other nonrheumatic aortic valve disorders: Secondary | ICD-10-CM | POA: Insufficient documentation

## 2015-12-29 LAB — ECHOCARDIOGRAM COMPLETE
AVLVOTPG: 5 mmHg
AVPHT: 645 ms
CHL CUP DOP CALC LVOT VTI: 29.1 cm
CHL CUP TV REG PEAK VELOCITY: 262 cm/s
E decel time: 225 msec
EERAT: 8.03
FS: 29 % (ref 28–44)
IV/PV OW: 1.3
LA diam end sys: 43 mm
LA diam index: 2.23 cm/m2
LA vol index: 32.6 mL/m2
LA vol: 63 mL
LASIZE: 43 mm
LAVOLA4C: 59 mL
LDCA: 3.46 cm2
LV E/e' medial: 8.03
LV PW d: 7.62 mm — AB (ref 0.6–1.1)
LVEEAVG: 8.03
LVELAT: 9.1 cm/s
LVOT SV: 101 mL
LVOT diameter: 21 mm
LVOT peak vel: 117 cm/s
MV Dec: 225
MVPG: 2 mmHg
MVPKAVEL: 63.2 m/s
MVPKEVEL: 73.1 m/s
TDI e' lateral: 9.1
TDI e' medial: 6.47
TRMAXVEL: 262 cm/s

## 2016-09-20 ENCOUNTER — Other Ambulatory Visit: Payer: Self-pay | Admitting: Cardiology

## 2016-10-04 ENCOUNTER — Encounter: Payer: Self-pay | Admitting: Cardiology

## 2016-10-13 ENCOUNTER — Ambulatory Visit (INDEPENDENT_AMBULATORY_CARE_PROVIDER_SITE_OTHER): Payer: Medicare Other | Admitting: Cardiology

## 2016-10-13 ENCOUNTER — Encounter (INDEPENDENT_AMBULATORY_CARE_PROVIDER_SITE_OTHER): Payer: Self-pay

## 2016-10-13 ENCOUNTER — Encounter: Payer: Self-pay | Admitting: Cardiology

## 2016-10-13 VITALS — BP 138/90 | HR 57 | Ht 66.0 in | Wt 189.0 lb

## 2016-10-13 DIAGNOSIS — I4819 Other persistent atrial fibrillation: Secondary | ICD-10-CM

## 2016-10-13 DIAGNOSIS — I10 Essential (primary) hypertension: Secondary | ICD-10-CM | POA: Diagnosis not present

## 2016-10-13 DIAGNOSIS — I471 Supraventricular tachycardia: Secondary | ICD-10-CM | POA: Diagnosis not present

## 2016-10-13 DIAGNOSIS — I6523 Occlusion and stenosis of bilateral carotid arteries: Secondary | ICD-10-CM

## 2016-10-13 DIAGNOSIS — I481 Persistent atrial fibrillation: Secondary | ICD-10-CM

## 2016-10-13 HISTORY — DX: Occlusion and stenosis of bilateral carotid arteries: I65.23

## 2016-10-13 MED ORDER — APIXABAN 5 MG PO TABS: 5.0000 mg | ORAL_TABLET | Freq: Two times a day (BID) | ORAL | 0 refills | Status: DC

## 2016-10-13 MED ORDER — METOPROLOL SUCCINATE ER 25 MG PO TB24: 25.0000 mg | ORAL_TABLET | Freq: Every day | ORAL | 3 refills | Status: DC

## 2016-10-13 NOTE — Patient Instructions (Signed)
Medication Instructions:  1) STOP ASPIRIN  2) You have been given a prescription to START ELIQUIS 5 mg TWICE DAILY. You will start this AFTER your colonoscopy when GI says it is OK.  Labwork: TODAY: BMET, CBC  Testing/Procedures: Your physician has requested that you have a carotid duplex. This test is an ultrasound of the carotid arteries in your neck. It looks at blood flow through these arteries that supply the brain with blood. Allow one hour for this exam. There are no restrictions or special instructions.  Follow-Up: Your physician wants you to follow-up in: 6 months with Dr. Radford Pax. You will receive a reminder letter in the mail two months in advance. If you don't receive a letter, please call our office to schedule the follow-up appointment.   Any Other Special Instructions Will Be Listed Below (If Applicable).     If you need a refill on your cardiac medications before your next appointment, please call your pharmacy.

## 2016-10-13 NOTE — Progress Notes (Signed)
Cardiology Office Note    Date:  10/13/2016   ID:  Rhonda [name], DOB 01/27/1941, MRN [id number]  PCP:  Irven Shelling, MD  Cardiologist:  Fransico Him, MD   Chief Complaint  Patient presents with  . Atrial Fibrillation  . Hypertension    History of Present Illness:  Rhonda [name] is a 76 y.o. female with a history of PAF s/p afib ablation, HTN, bilateral carotid stenosis < 50%, PVC's, PAC's and nonsustained atrial tachycardia who presents today for followup. She is doing well. She denies any LE edema, PND, orthopnea or syncope. She has some mild DOE when working in the yard that has increased some but she attributes it to being sedentary. She complains that occasionally she will have CP in her midsternal area.  This is similar to her CP back in 2016 which was normal and felt to be due to GERD.  It occurs at night and but none during the day when she exerts herself.  This has not changed at all.   She occasionally has a skipped heart beat or a fluttering.    Past Medical History:  Diagnosis Date  . Adenomatous colon polyp    cecum, last colonoscopy 2006  . Atrial tachycardia ([place])    non sustatined with PVCs and PACs  . Bacterial meningitis    as a child  . Basilar migraine   . Carotid stenosis, bilateral 10/13/2016   < 50% by dopplers 2015  . Cystocele    MIld  . History of hemorrhoids   . Hx of chest pain    Atypical  . Hx of hematuria    IVP 1990 left kidney stone, cystoscopy negative  . Hyperlipidemia   . Hypertension   . Nephrolithiasis   . Osteoporosis    actonel since 2006  . PAF (paroxysmal atrial fibrillation) ([place])    radiofrequency ablation of afib bowman gray  . Right facial numbness 5/12   question TIA versus migraine,cardiac monitoring neg for AFIB  . Seizures ([place]) 1980   7 years on Dilantin  . Snoring   . SOB (shortness of breath)    Chronic mild, negative cardiolite  . Tinea versicolor     Past Surgical History:  Procedure  Laterality Date  . multiple ankle surgeries      Current Medications: Current Meds  Medication Sig  . acetaminophen (TYLENOL) 325 MG tablet Take 650 mg by mouth every 6 (six) hours as needed for mild pain.  Marland Kitchen ALPRAZolam (XANAX) 0.5 MG tablet Take 0.5 mg by mouth at bedtime as needed for anxiety. USES FOR SLEEP  . aspirin 81 MG tablet Take 81 mg by mouth daily.  . cycloSPORINE (RESTASIS) 0.05 % ophthalmic emulsion Place 1 drop into both eyes 2 (two) times daily.  . DiphenhydrAMINE HCl (BENADRYL PO) Take by mouth. FOR SLEEP  . ibuprofen (ADVIL,MOTRIN) 200 MG tablet Take 400 mg by mouth at bedtime.  . metoprolol succinate (TOPROL-XL) 25 MG 24 hr tablet TAKE 1 TABLET(25 MG) BY MOUTH DAILY  . Multiple Vitamin (MULTIVITAMIN) tablet Take 1 tablet by mouth daily. With vitamin D  . Naproxen Sodium (ALEVE) 220 MG CAPS Take 220 mg by mouth as needed.  Marland Kitchen omeprazole (PRILOSEC OTC) 20 MG tablet Take 20 mg by mouth as needed.  . pravastatin (PRAVACHOL) 40 MG tablet Take 40 mg by mouth daily.  . Pseudoephedrine HCl (SUDAFED PO) Take by mouth as needed. For Allergies  . ranitidine (ZANTAC) 150 MG tablet Take 150 mg by  mouth 2 (two) times daily.    Allergies:   Erythromycin   Social History   Social History  . Marital status: Married    Spouse name: N/A  . Number of children: N/A  . Years of education: N/A   Social History Main Topics  . Smoking status: Never Smoker  . Smokeless tobacco: Never Used  . Alcohol use Yes     Comment: 1-2 glasses of wine weekly  . Drug use: No  . Sexual activity: Not Asked   Other Topics Concern  . None   Social History Narrative  . None     Family History:  The patient's family history includes Heart attack in her father; Parkinson's disease in her mother.   ROS:   Please see the history of present illness.    ROS All other systems reviewed and are negative.  No flowsheet data found.     PHYSICAL EXAM:   VS:  BP 138/90   Pulse (!) 57   Ht 5\' 6"   (1.676 m)   Wt 189 lb (85.7 kg)   SpO2 97%   BMI 30.51 kg/m    GEN: Well nourished, well developed, in no acute distress  HEENT: normal  Neck: no JVD, carotid bruits, or masses Cardiac: RRR; no murmurs, rubs, or gallops,no edema.  Intact distal pulses bilaterally.  Respiratory:  clear to auscultation bilaterally, normal work of breathing GI: soft, nontender, nondistended, + BS MS: no deformity or atrophy  Skin: warm and dry, no rash Neuro:  Alert and Oriented x 3, Strength and sensation are intact Psych: euthymic mood, full affect  Wt Readings from Last 3 Encounters:  10/13/16 189 lb (85.7 kg)  10/14/15 184 lb (83.5 kg)  03/24/15 177 lb 6.4 oz (80.5 kg)      Studies/Labs Reviewed:   EKG:  EKG is ordered today.  The ekg ordered today demonstrates sinus bradycardia at 55bpm with no ST changes  Recent Labs: No results found for requested labs within last 8760 hours.   Lipid Panel No results found for: CHOL, TRIG, HDL, CHOLHDL, VLDL, LDLCALC, LDLDIRECT  Additional studies/ records that were reviewed today include:  none    ASSESSMENT:    1. Persistent atrial fibrillation ([place])   2. Atrial tachycardia, paroxysmal ([place])   3. Benign essential HTN   4. Carotid stenosis, bilateral      PLAN:  In order of problems listed above:  1. Persistent atrial fibrillation s/p ablation.  She is maintaining NSR. Her CHADS2VASC score is 4 and will be 5 when she turns 76 in the fall and is currently  not on anticoagulation.  She will continue on Metoprolol.  I have recommended that we start her on Eliquis at 5mg  BID and stop ASA.  She has no history of bleeding problems. She is having a colonoscopy in a few weeks and will wait until she has that done to start her Eliquis.  2. Paroxysmal atrial tachycardia - suppressed on BB.   3. HTN - BP appears controlled today on exam.  She will continue on BB. 4. Bilateral carotid artery stenosis < 50% by dopplers 2015.  I will repeat dopplers to  make sure this has not progressed.  She will continue on statin therapy which is followed by her PCP.    Medication Adjustments/Labs and Tests Ordered: Current medicines are reviewed at length with the patient today.  Concerns regarding medicines are outlined above.  Medication changes, Labs and Tests ordered today are listed in the  Patient Instructions below.  There are no Patient Instructions on file for this visit.   Signed, Fransico Him, MD  10/13/2016 1:40 PM    [facility] [place], [place], [place]  [address] Phone: [phone]; Fax: [phone]

## 2016-10-14 LAB — BASIC METABOLIC PANEL
BUN / CREAT RATIO: 20 (ref 12–28)
BUN: 15 mg/dL (ref 8–27)
CALCIUM: 9.4 mg/dL (ref 8.7–10.3)
CHLORIDE: 102 mmol/L (ref 96–106)
CO2: 21 mmol/L (ref 18–29)
Creatinine, Ser: 0.75 mg/dL (ref 0.57–1.00)
GFR calc non Af Amer: 79 mL/min/{1.73_m2} (ref >59)
GFR, EST AFRICAN AMERICAN: 91 mL/min/{1.73_m2} (ref >59)
GLUCOSE: 84 mg/dL (ref 65–99)
POTASSIUM: 4.7 mmol/L (ref 3.5–5.2)
Sodium: 142 mmol/L (ref 134–144)

## 2016-10-14 LAB — CBC WITH DIFFERENTIAL/PLATELET
BASOS ABS: 0 10*3/uL (ref 0.0–0.2)
Basos: 0 %
EOS (ABSOLUTE): 0.1 10*3/uL (ref 0.0–0.4)
Eos: 2 %
Hematocrit: 40.1 % (ref 34.0–46.6)
Hemoglobin: 13 g/dL (ref 11.1–15.9)
Immature Grans (Abs): 0 10*3/uL (ref 0.0–0.1)
Immature Granulocytes: 0 %
LYMPHS ABS: 3.3 10*3/uL — AB (ref 0.7–3.1)
Lymphs: 49 %
MCH: 29.9 pg (ref 26.6–33.0)
MCHC: 32.4 g/dL (ref 31.5–35.7)
MCV: 92 fL (ref 79–97)
Monocytes Absolute: 0.4 10*3/uL (ref 0.1–0.9)
Monocytes: 6 %
NEUTROS ABS: 3 10*3/uL (ref 1.4–7.0)
Neutrophils: 43 %
PLATELETS: 223 10*3/uL (ref 150–379)
RBC: 4.35 x10E6/uL (ref 3.77–5.28)
RDW: 13.8 % (ref 12.3–15.4)
WBC: 6.8 10*3/uL (ref 3.4–10.8)

## 2016-10-27 ENCOUNTER — Telehealth: Payer: Self-pay

## 2016-10-27 ENCOUNTER — Encounter: Payer: Self-pay | Admitting: Cardiology

## 2016-10-27 ENCOUNTER — Ambulatory Visit (HOSPITAL_COMMUNITY)
Admission: RE | Admit: 2016-10-27 | Discharge: 2016-10-27 | Disposition: A | Discharge status: Home / Self Care | Payer: Medicare Other | Source: Ambulatory Visit | Attending: Cardiovascular Disease | Admitting: Cardiovascular Disease

## 2016-10-27 ENCOUNTER — Other Ambulatory Visit: Payer: Self-pay | Admitting: Cardiology

## 2016-10-27 DIAGNOSIS — I6523 Occlusion and stenosis of bilateral carotid arteries: Secondary | ICD-10-CM

## 2016-10-27 DIAGNOSIS — I4819 Other persistent atrial fibrillation: Secondary | ICD-10-CM

## 2016-10-27 DIAGNOSIS — I481 Persistent atrial fibrillation: Secondary | ICD-10-CM | POA: Diagnosis not present

## 2016-10-27 NOTE — Telephone Encounter (Signed)
Informed patient of results and verbal understanding expressed.  Repeat study ordered to be scheduled in 2 years. Patient agrees with treatment plan.

## 2016-10-27 NOTE — Telephone Encounter (Signed)
-----   Message from Sueanne Margarita, MD sent at 10/27/2016  2:15 PM EDT ----- 1-39% bilateral stenosis - repeat scan in 2 years

## 2016-11-09 ENCOUNTER — Telehealth: Payer: Self-pay

## 2016-11-09 MED ORDER — APIXABAN 5 MG PO TABS: 5.0000 mg | ORAL_TABLET | Freq: Two times a day (BID) | ORAL | 3 refills | Status: DC

## 2016-11-09 NOTE — Telephone Encounter (Signed)
Received letter from [facility] stating: "Rhonda [name] was seen for a colonoscopy on 11/07/2016. She may start her Eliquis at any time. If you have any questions or concerns, please don't hesitate to contact us. Sincerely, Hewitt Blade, MD"  Contacted patient. Confirmed with her she still have free 30 day card for Eliquis given to her at [place] in March. Instructed her to start medication as soon as she gets it. Refills sent to [facility] per patient request. Patient agrees with treatment plan.

## 2017-02-10 ENCOUNTER — Telehealth: Payer: Self-pay | Admitting: Cardiology

## 2017-02-10 ENCOUNTER — Encounter (HOSPITAL_COMMUNITY): Payer: Self-pay | Admitting: Nurse Practitioner

## 2017-02-10 ENCOUNTER — Ambulatory Visit (HOSPITAL_COMMUNITY)
Admission: RE | Admit: 2017-02-10 | Discharge: 2017-02-10 | Disposition: A | Discharge status: Home / Self Care | Payer: Medicare Other | Source: Ambulatory Visit | Attending: Nurse Practitioner | Admitting: Nurse Practitioner

## 2017-02-10 VITALS — BP 118/76 | HR 62 | Ht 66.0 in | Wt 188.8 lb

## 2017-02-10 DIAGNOSIS — Z9889 Other specified postprocedural states: Secondary | ICD-10-CM | POA: Insufficient documentation

## 2017-02-10 DIAGNOSIS — I480 Paroxysmal atrial fibrillation: Secondary | ICD-10-CM | POA: Insufficient documentation

## 2017-02-10 DIAGNOSIS — Z79899 Other long term (current) drug therapy: Secondary | ICD-10-CM | POA: Diagnosis not present

## 2017-02-10 DIAGNOSIS — I10 Essential (primary) hypertension: Secondary | ICD-10-CM | POA: Insufficient documentation

## 2017-02-10 DIAGNOSIS — Z7901 Long term (current) use of anticoagulants: Secondary | ICD-10-CM | POA: Diagnosis not present

## 2017-02-10 DIAGNOSIS — E785 Hyperlipidemia, unspecified: Secondary | ICD-10-CM | POA: Diagnosis not present

## 2017-02-10 DIAGNOSIS — I4891 Unspecified atrial fibrillation: Secondary | ICD-10-CM | POA: Diagnosis present

## 2017-02-10 MED ORDER — DILTIAZEM HCL 30 MG PO TABS: ORAL_TABLET | ORAL | 1 refills | Status: DC

## 2017-02-10 NOTE — Telephone Encounter (Signed)
Pt c/o of Chest Pain: STAT if CP now or developed within 24 hours  1. Are you having CP right now? Some slight pain, pressure and burning  2. Are you experiencing any other symptoms (ex. SOB, nausea, vomiting, sweating)? No   3. How long have you been experiencing CP? Since before 6 am this morning   4. Is your CP continuous or coming and going? continuous  5. Have you taken Nitroglycerin? No   Patient also states that she had an AFIB episode. ?

## 2017-02-10 NOTE — Progress Notes (Signed)
Primary Care Physician: Lavone Orn, MD Referring Physician: [facility] triage office Cardiologist: Dr. Tilda Franco Rhonda [name] is a 76 y.o. female with a h/o afib and remote afib ablation 12 years ago with Dr. Ola Spurr that is in the afib clinic for evaluation. She called [facility] office this am for afib that was present upon pt awakening around 6 am, for advice how to manage and was referred here. In the clinic now, she is in [place]. She spontaneously converted around 9:30 this am. She takes her BB at hs.  She has had very little afib since ablation. She feels that she may snore, with waking up to go to the bathroom every 2 hours, known snoring and easy to fall asleep during the day when quiet. No significant caffeine, alcohol. No tobacco.No known trigger.  Today, she denies symptoms of palpitations, chest pain, shortness of breath, orthopnea, PND, lower extremity edema, dizziness, presyncope, syncope, or neurologic sequela. The patient is tolerating medications without difficulties and is otherwise without complaint today.   Past Medical History:  Diagnosis Date  . Adenomatous colon polyp    cecum, last colonoscopy 2006  . Atrial tachycardia ([place])    non sustatined with PVCs and PACs  . Bacterial meningitis    as a child  . Basilar migraine   . Carotid stenosis, bilateral 10/13/2016   1-39% bilateral by dopplers 10/2016  . Cystocele    MIld  . History of hemorrhoids   . Hx of chest pain    Atypical  . Hx of hematuria    IVP 1990 left kidney stone, cystoscopy negative  . Hyperlipidemia   . Hypertension   . Nephrolithiasis   . Osteoporosis    actonel since 2006  . PAF (paroxysmal atrial fibrillation) ([place])    radiofrequency ablation of afib bowman gray  . Right facial numbness 5/12   question TIA versus migraine,cardiac monitoring neg for AFIB  . Seizures ([place]) 1980   7 years on Dilantin  . Snoring   . SOB (shortness of breath)    Chronic mild, negative  cardiolite  . Tinea versicolor    Past Surgical History:  Procedure Laterality Date  . multiple ankle surgeries      Current Outpatient Prescriptions  Medication Sig Dispense Refill  . acetaminophen (TYLENOL) 325 MG tablet Take 650 mg by mouth every 6 (six) hours as needed for mild pain.    Marland Kitchen ALPRAZolam (XANAX) 0.5 MG tablet Take 0.5 mg by mouth at bedtime as needed for anxiety. USES FOR SLEEP    . apixaban (ELIQUIS) 5 MG TABS tablet Take 1 tablet (5 mg total) by mouth 2 (two) times daily. 180 tablet 3  . calcium citrate-vitamin D (CITRACAL+D) 315-200 MG-UNIT per tablet Take 1 tablet by mouth daily.    . cycloSPORINE (RESTASIS) 0.05 % ophthalmic emulsion Place 1 drop into both eyes 2 (two) times daily.    . metoprolol succinate (TOPROL-XL) 25 MG 24 hr tablet Take 1 tablet (25 mg total) by mouth daily. 90 tablet 3  . Multiple Vitamin (MULTIVITAMIN) tablet Take 1 tablet by mouth daily. With vitamin D    . Naproxen Sodium (ALEVE) 220 MG CAPS Take 220 mg by mouth as needed.    . pravastatin (PRAVACHOL) 40 MG tablet Take 40 mg by mouth daily.    Marland Kitchen diltiazem (CARDIZEM) 30 MG tablet Take 1 tablet every 4 hours AS NEEDED for AFIB heart rate >100 45 tablet 1  . DiphenhydrAMINE HCl (BENADRYL PO) Take by  mouth. FOR SLEEP    . omeprazole (PRILOSEC OTC) 20 MG tablet Take 20 mg by mouth as needed.    . ranitidine (ZANTAC) 150 MG tablet Take 150 mg by mouth 2 (two) times daily.     No current facility-administered medications for this encounter.     Allergies  Allergen Reactions  . Erythromycin     Yeast infection    Social History   Social History  . Marital status: Married    Spouse name: N/A  . Number of children: N/A  . Years of education: N/A   Occupational History  . Not on file.   Social History Main Topics  . Smoking status: Never Smoker  . Smokeless tobacco: Never Used  . Alcohol use Yes     Comment: 1-2 glasses of wine weekly  . Drug use: No  . Sexual activity: Not on  file   Other Topics Concern  . Not on file   Social History Narrative  . No narrative on file    Family History  Problem Relation Age of Onset  . Parkinson's disease Mother   . Heart attack Father     ROS- All systems are reviewed and negative except as per the HPI above  Physical Exam: Vitals:   02/10/17 1038  BP: 118/76  Pulse: 62  Weight: 188 lb 12.8 oz (85.6 kg)  Height: 5\' 6"  (1.676 m)   Wt Readings from Last 3 Encounters:  02/10/17 188 lb 12.8 oz (85.6 kg)  10/13/16 189 lb (85.7 kg)  10/14/15 184 lb (83.5 kg)    Labs: Lab Results  Component Value Date   NA 142 10/13/2016   K 4.7 10/13/2016   CL 102 10/13/2016   CO2 21 10/13/2016   GLUCOSE 84 10/13/2016   BUN 15 10/13/2016   CREATININE 0.75 10/13/2016   CALCIUM 9.4 10/13/2016   No results found for: INR No results found for: CHOL, HDL, LDLCALC, TRIG   GEN- The patient is well appearing, alert and oriented x 3 today.   Head- normocephalic, atraumatic Eyes-  Sclera clear, conjunctiva pink Ears- hearing intact Oropharynx- clear Neck- supple, no JVP Lymph- no cervical lymphadenopathy Lungs- Clear to ausculation bilaterally, normal work of breathing Heart- Regular rate and rhythm, no murmurs, rubs or gallops, PMI not laterally displaced GI- soft, NT, ND, + BS Extremities- no clubbing, cyanosis, or edema MS- no significant deformity or atrophy Skin- no rash or lesion Psych- euthymic mood, full affect Neuro- strength and sensation are intact  EKG-NSR at 62 bpm, Pr int 156 ms, qrs int 80 ms, qtc 416 ms Epic records reviewed    Assessment and Plan: 1. Paroxysmal afib Episode from this am now resolved Triggers discussed She snores, may possibly have sleep apnea, she will discuss with Dr. Radford Pax on f/u in September Continue metoprolol Will add Cardizem 30 mg every 4 hours if needed for break through afib episodes Continue eliquis for chadsvasc score of at least 4  F/u with Dr. Radford Pax 9/25 afib  clinic as needed  [place]. Zairah Arista, [facility] [address] [place], [place] [address] [phone]

## 2017-02-10 NOTE — Telephone Encounter (Signed)
Patient calling complaining of burning in her chest, and stated she feels like her heart is pounding. Patient stated all this started around 6:00 am and has been going on for about 3 hours. Patient thinks she is back in A. FIB. Patient stated her heart was pounding harder earlier, but has eased off some. Patient stated she usually only has short episodes of A. FIB, but this has lasted longer than usual. A. FIB clinic has an opening this morning, scheduled patient for appointment to be evaluated. Given directions to A. FIB clinic, code for garage, and the number to their office. Patient verbalized understanding and will call with any other questions or concerns.

## 2017-02-10 NOTE — Patient Instructions (Signed)
Cardizem 30mg  -- take 1 tablet every 4 hours AS NEEDED for AFIB heart rate >100 check your blood pressure prior to taking to make sure top number of blood pressure is above 100.

## 2017-03-31 ENCOUNTER — Encounter: Payer: Self-pay | Admitting: Cardiology

## 2017-04-11 ENCOUNTER — Encounter: Payer: Self-pay | Admitting: Cardiology

## 2017-04-11 ENCOUNTER — Ambulatory Visit (INDEPENDENT_AMBULATORY_CARE_PROVIDER_SITE_OTHER): Payer: Medicare Other | Admitting: Cardiology

## 2017-04-11 VITALS — BP 118/76 | HR 59 | Ht 66.0 in | Wt 192.2 lb

## 2017-04-11 DIAGNOSIS — I10 Essential (primary) hypertension: Secondary | ICD-10-CM

## 2017-04-11 DIAGNOSIS — R079 Chest pain, unspecified: Secondary | ICD-10-CM

## 2017-04-11 DIAGNOSIS — I4719 Other supraventricular tachycardia: Secondary | ICD-10-CM

## 2017-04-11 DIAGNOSIS — I471 Supraventricular tachycardia: Secondary | ICD-10-CM | POA: Diagnosis not present

## 2017-04-11 DIAGNOSIS — I481 Persistent atrial fibrillation: Secondary | ICD-10-CM

## 2017-04-11 DIAGNOSIS — I4819 Other persistent atrial fibrillation: Secondary | ICD-10-CM

## 2017-04-11 DIAGNOSIS — I6523 Occlusion and stenosis of bilateral carotid arteries: Secondary | ICD-10-CM | POA: Diagnosis not present

## 2017-04-11 LAB — CBC WITH DIFFERENTIAL/PLATELET
BASOS ABS: 0 10*3/uL (ref 0.0–0.2)
Basos: 1 %
EOS (ABSOLUTE): 0.1 10*3/uL (ref 0.0–0.4)
Eos: 1 %
Hematocrit: 37.5 % (ref 34.0–46.6)
Hemoglobin: 12.7 g/dL (ref 11.1–15.9)
Immature Grans (Abs): 0 10*3/uL (ref 0.0–0.1)
Immature Granulocytes: 0 %
LYMPHS ABS: 2.4 10*3/uL (ref 0.7–3.1)
Lymphs: 42 %
MCH: 30 pg (ref 26.6–33.0)
MCHC: 33.9 g/dL (ref 31.5–35.7)
MCV: 89 fL (ref 79–97)
Monocytes Absolute: 0.5 10*3/uL (ref 0.1–0.9)
Monocytes: 8 %
NEUTROS ABS: 2.8 10*3/uL (ref 1.4–7.0)
Neutrophils: 48 %
Platelets: 227 10*3/uL (ref 150–379)
RBC: 4.23 x10E6/uL (ref 3.77–5.28)
RDW: 13.9 % (ref 12.3–15.4)
WBC: 5.7 10*3/uL (ref 3.4–10.8)

## 2017-04-11 LAB — BASIC METABOLIC PANEL
BUN / CREAT RATIO: 16 (ref 12–28)
BUN: 11 mg/dL (ref 8–27)
CHLORIDE: 104 mmol/L (ref 96–106)
CO2: 22 mmol/L (ref 20–29)
Calcium: 9.3 mg/dL (ref 8.7–10.3)
Creatinine, Ser: 0.7 mg/dL (ref 0.57–1.00)
GFR calc non Af Amer: 85 mL/min/{1.73_m2} (ref >59)
GFR, EST AFRICAN AMERICAN: 98 mL/min/{1.73_m2} (ref >59)
Glucose: 82 mg/dL (ref 65–99)
POTASSIUM: 4.1 mmol/L (ref 3.5–5.2)
Sodium: 141 mmol/L (ref 134–144)

## 2017-04-11 NOTE — Patient Instructions (Signed)
Medication Instructions:  1) STOP TOPROL  Labwork: TODAY: BMET, CBC  Testing/Procedures: Your physician has recommended that you wear an event monitor. Event monitors are medical devices that record the heart's electrical activity. Doctors most often [place] these monitors to diagnose arrhythmias. Arrhythmias are problems with the speed or rhythm of the heartbeat. The monitor is a small, portable device. You can wear one while you do your normal daily activities. This is usually used to diagnose what is causing palpitations/syncope (passing out).   Your provider has requested that you have a lexiscan myoview. For further information please visit [email or web address]. Please follow instruction sheet, as given.  Dr. Radford Pax recommends you have a HOME SLEEP TEST.  Follow-Up: Your provider wants you to follow-up in: 6 months with Dr. Radford Pax. You will receive a reminder letter in the mail two months in advance. If you don't receive a letter, please call our office to schedule the follow-up appointment.    Any Other Special Instructions Will Be Listed Below (If Applicable).     If you need a refill on your cardiac medications before your next appointment, please call your pharmacy.

## 2017-04-11 NOTE — Progress Notes (Signed)
Cardiology Office Note:    Date:  04/11/2017   ID:  Rhonda [name], DOB 02-11-1941, MRN [id number]  PCP:  Lavone Orn, MD  Cardiologist:  Fransico Him, MD   Referring MD: Lavone Orn, MD   Chief Complaint  Patient presents with  . Atrial Fibrillation  . Hypertension    History of Present Illness:    Rhonda [name] is a 76 y.o. female with a hx of persistent AF s/p afib ablation,  bilateral carotid stenosis < 50%, PVC's, PAC's and nonsustained atrial tachycardia.  She is here today for followup and is doing well.  She denies any SOB, DOE, PND, orthopnea, LE edema, dizziness or syncope.  She recently had some problems with palpitations that awakened her early in the am. The palpitations lasted about 4 hours and was associated with chest tightness.  The CP resolved as soon as she went out of afib.  She was seen in afib clinic and a sleep study was recommended but she wanted to hold off. She had another episode the next day for about 1.5 hours and resovled on its own. She also is having episodes were her BP is dropping down to 94/29mmHg but also had some SBPs at low as 34mmHg.  She has not had any more episodes of CP since that first episode.   Past Medical History:  Diagnosis Date  . Adenomatous colon polyp    cecum, last colonoscopy 2006  . Atrial tachycardia ([place])    non sustatined with PVCs and PACs  . Bacterial meningitis    as a child  . Basilar migraine   . Carotid stenosis, bilateral 10/13/2016   1-39% bilateral by dopplers 10/2016  . Cystocele    MIld  . History of hemorrhoids   . Hx of chest pain    Atypical  . Hx of hematuria    IVP 1990 left kidney stone, cystoscopy negative  . Hyperlipidemia   . Hypertension   . Nephrolithiasis   . Osteoporosis    actonel since 2006  . PAF (paroxysmal atrial fibrillation) ([place])    radiofrequency ablation of afib [facility]  . Right facial numbness 5/12   question TIA versus migraine,cardiac monitoring neg for AFIB  .  Seizures ([place]) 1980   7 years on Dilantin  . Snoring   . SOB (shortness of breath)    Chronic mild, negative cardiolite  . Tinea versicolor     Past Surgical History:  Procedure Laterality Date  . multiple ankle surgeries      Current Medications: Current Meds  Medication Sig  . acetaminophen (TYLENOL) 325 MG tablet Take 650 mg by mouth every 6 (six) hours as needed for mild pain.  Marland Kitchen ALPRAZolam (XANAX) 0.5 MG tablet Take 0.5 mg by mouth at bedtime as needed for anxiety. USES FOR SLEEP  . apixaban (ELIQUIS) 5 MG TABS tablet Take 1 tablet (5 mg total) by mouth 2 (two) times daily.  . calcium citrate-vitamin D (CITRACAL+D) 315-200 MG-UNIT per tablet Take 1 tablet by mouth daily.  . cycloSPORINE (RESTASIS) 0.05 % ophthalmic emulsion Place 1 drop into both eyes 2 (two) times daily.  Marland Kitchen diltiazem (CARDIZEM) 30 MG tablet Take 1 tablet every 4 hours AS NEEDED for AFIB heart rate >100  . DiphenhydrAMINE HCl (BENADRYL PO) Take by mouth. FOR SLEEP  . metoprolol succinate (TOPROL-XL) 25 MG 24 hr tablet Take 1 tablet (25 mg total) by mouth daily.  . Multiple Vitamin (MULTIVITAMIN) tablet Take 1 tablet by mouth daily. With  vitamin D  . Naproxen Sodium (ALEVE) 220 MG CAPS Take 220 mg by mouth as needed.  Marland Kitchen omeprazole (PRILOSEC OTC) 20 MG tablet Take 20 mg by mouth as needed.  . pravastatin (PRAVACHOL) 40 MG tablet Take 40 mg by mouth daily.  . ranitidine (ZANTAC) 150 MG tablet Take 150 mg by mouth 2 (two) times daily.     Allergies:   Erythromycin   Social History   Social History  . Marital status: Married    Spouse name: N/A  . Number of children: N/A  . Years of education: N/A   Social History Main Topics  . Smoking status: Never Smoker  . Smokeless tobacco: Never Used  . Alcohol use Yes     Comment: 1-2 glasses of wine weekly  . Drug use: No  . Sexual activity: Not Asked   Other Topics Concern  . None   Social History Narrative  . None     Family History: The patient's  family history includes Heart attack in her father; Parkinson's disease in her mother.  ROS:   Please see the history of present illness.    ROS  All other systems reviewed and negative.   EKGs/Labs/Other Studies Reviewed:    The following studies were reviewed today: none  EKG:  EKG is  ordered today.    Recent Labs: 10/13/2016: BUN 15; Creatinine, Ser 0.75; Hemoglobin 13.0; Platelets 223; Potassium 4.7; Sodium 142   Recent Lipid Panel No results found for: CHOL, TRIG, HDL, CHOLHDL, VLDL, LDLCALC, LDLDIRECT  Physical Exam:    VS:  BP 118/76   Pulse (!) 59   Ht 5\' 6"  (1.676 m)   Wt 192 lb 3.2 oz (87.2 kg)   SpO2 99%   BMI 31.02 kg/m     Wt Readings from Last 3 Encounters:  04/11/17 192 lb 3.2 oz (87.2 kg)  02/10/17 188 lb 12.8 oz (85.6 kg)  10/13/16 189 lb (85.7 kg)     GEN:  Well nourished, well developed in no acute distress HEENT: Normal NECK: No JVD; No carotid bruits LYMPHATICS: No lymphadenopathy CARDIAC: RRR, no murmurs, rubs, gallops RESPIRATORY:  Clear to auscultation without rales, wheezing or rhonchi  ABDOMEN: Soft, non-tender, non-distended MUSCULOSKELETAL:  No edema; No deformity  SKIN: Warm and dry NEUROLOGIC:  Alert and oriented x 3 PSYCHIATRIC:  Normal affect   ASSESSMENT:    1. Persistent atrial fibrillation ([place])   2. Atrial tachycardia, paroxysmal ([place])   3. Benign essential HTN   4. Carotid stenosis, bilateral   5. Chest pain, unspecified type    PLAN:    In order of problems listed above:  1.  Persistent atrial fibrillation - she is s/p afib ablation and has not had any further afib. She will continue on Apixaban 5mg  BID.  She is having a lot of palpitations recently and now we have to stop BB due to hypotension.  I will check a CBC and BMET today since she is on a DOAC.  I am going to get a 30 day event monitor to assess what her palpitations are.  I have also recommended a home sleep study to rule out OSA since some of her palpitations  have occurred at night.   2.  Paroxysmal atrial tachycardia - stable but having more palpitations - event monitor pending.   3.  HTN - her BP is well controlled on exam today but she is having problems with SBP dropping into the mid 16'X systolic. I will stop her  Toprol XL and she will let me know if he continues to have low BP. She will check her BP daily and call in a week with her readings.  I have also encouraged her to wear her compression hose when she is standing for long periods of time or out working I her garden.   4.  Bilateral carotid artery stenosis (1-39% by dopplers 10/2016) - continue statin.  No ASA due to DOAC.  5.  Chest pain - this occurred during prolonged palpitations but has had several other episodes. I will get a lexiscan myoview to rule out ischemia.    Medication Adjustments/Labs and Tests Ordered: Current medicines are reviewed at length with the patient today.  Concerns regarding medicines are outlined above.  No orders of the defined types were placed in this encounter.  No orders of the defined types were placed in this encounter.   Signed, Fransico Him, MD  04/11/2017 10:56 AM    [place]

## 2017-04-14 ENCOUNTER — Telehealth: Payer: Self-pay | Admitting: *Deleted

## 2017-04-14 NOTE — Telephone Encounter (Signed)
HST ordered today 04/14/17. Informed patient of upcoming home sleep study and patient understanding was verbalized. Patient understands her sleep study will be done at [place]. Patient understands she will receive a call in a week or so. Patient understands to call if she does not receive the call in a timely manner. Patient agrees with treatment and thanked me for call.  [facility] has been faxed all paperwork.

## 2017-04-14 NOTE — Telephone Encounter (Signed)
-----   Message from Theodoro Parma, RN sent at 04/11/2017  3:10 PM EDT ----- Regarding: HST Please order/arrange HST per Dr. Radford Pax  Thanks!

## 2017-04-18 ENCOUNTER — Telehealth (HOSPITAL_COMMUNITY): Payer: Self-pay | Admitting: *Deleted

## 2017-04-18 NOTE — Telephone Encounter (Signed)
Patient given detailed instructions per Myocardial Perfusion Study Information Sheet for the test on 04/21/17. Patient notified to arrive 15 minutes early and that it is imperative to arrive on time for appointment to keep from having the test rescheduled.  If you need to cancel or reschedule your appointment, please call the office within 24 hours of your appointment. . Patient verbalized understanding. Rhonda [name]

## 2017-04-21 ENCOUNTER — Telehealth: Payer: Self-pay

## 2017-04-21 ENCOUNTER — Ambulatory Visit (INDEPENDENT_AMBULATORY_CARE_PROVIDER_SITE_OTHER): Payer: Medicare Other

## 2017-04-21 ENCOUNTER — Ambulatory Visit (HOSPITAL_COMMUNITY): Payer: Medicare Other | Attending: Cardiovascular Disease

## 2017-04-21 DIAGNOSIS — I471 Supraventricular tachycardia: Secondary | ICD-10-CM

## 2017-04-21 DIAGNOSIS — R079 Chest pain, unspecified: Secondary | ICD-10-CM | POA: Insufficient documentation

## 2017-04-21 DIAGNOSIS — I4819 Other persistent atrial fibrillation: Secondary | ICD-10-CM

## 2017-04-21 DIAGNOSIS — I481 Persistent atrial fibrillation: Secondary | ICD-10-CM | POA: Diagnosis not present

## 2017-04-21 LAB — MYOCARDIAL PERFUSION IMAGING
CHL CUP NUCLEAR SSS: 4
LV sys vol: 24 mL
LVDIAVOL: 73 mL (ref 46–106)
Peak HR: 85 {beats}/min
RATE: 0.33
Rest HR: 68 {beats}/min
SDS: 3
SRS: 1
TID: 0.88

## 2017-04-21 MED ORDER — ISOSORBIDE MONONITRATE ER 30 MG PO TB24: 30.0000 mg | ORAL_TABLET | Freq: Every day | ORAL | 11 refills | Status: DC

## 2017-04-21 MED ORDER — REGADENOSON 0.4 MG/5ML IV SOLN
0.4000 mg | Freq: Once | INTRAVENOUS | Status: AC
  Administered 2017-04-21: 0.4 mg via INTRAVENOUS

## 2017-04-21 MED ORDER — TECHNETIUM TC 99M TETROFOSMIN IV KIT
31.8000 | PACK | Freq: Once | INTRAVENOUS | Status: AC | PRN
  Administered 2017-04-21: 31.8 via INTRAVENOUS
  Filled 2017-04-21: qty 32

## 2017-04-21 MED ORDER — TECHNETIUM TC 99M TETROFOSMIN IV KIT
10.5000 | PACK | Freq: Once | INTRAVENOUS | Status: AC | PRN
  Administered 2017-04-21: 10.5 via INTRAVENOUS
  Filled 2017-04-21: qty 11

## 2017-04-21 NOTE — Telephone Encounter (Signed)
Patient aware of stress test results. Per Dr. Radford Pax, ? Mild defect in the inferolateral wall that is likely  Diaphragmatic attenuation but cannot rule out a very small area of ischemia.  Please add Imdur 30mg  daily and have her followup in 2 weeks with PA. Patient verbalized understanding.  Patient has an OV on 10/23, and she will start Imdur 30 mg daily.  Patient also wanted to report her BPs after stopping her [place] 132/79 68 112/81 72 112/74 69 133/86 66 128/87 71 137/82 69 128/76 66 108/68 72 during A. FIB she went back up to 118/80 63 after A. FIB episode.   Will forward to Dr. Radford Pax, so she is aware.

## 2017-04-25 ENCOUNTER — Telehealth: Payer: Self-pay | Admitting: Cardiology

## 2017-04-25 NOTE — Telephone Encounter (Signed)
Rhonda [name] is calling because she is to have a Sleep Study and she is wearing a monitor and wants to know can she take the leads off during the sleep study and place them back on the next morning? . Please Call .Marland Kitchen Thanks

## 2017-04-25 NOTE — Telephone Encounter (Signed)
BP is stable.

## 2017-04-25 NOTE — Telephone Encounter (Signed)
Spoke with pt and told her that we would prefer she leave it on if possible.  Pt doing home sleep study and states that there is a strap that goes across one of the electrodes and she was concerned about wearing them both at the same time.  Advised if she needed to remove monitor to put it back on ASAP.  Pt also mentioned that she is having HA with the Imdur.  Pt has only been taking x 3 days.  Advised this is not unusual and should subside after about a week.  Advised if HAs become unbearable to please contact the office.  Pt verbalized understanding and was appreciative for all assistance.

## 2017-04-26 ENCOUNTER — Telehealth: Payer: Self-pay

## 2017-04-26 ENCOUNTER — Telehealth: Payer: Self-pay | Admitting: *Deleted

## 2017-04-26 NOTE — Telephone Encounter (Signed)
error 

## 2017-04-26 NOTE — Telephone Encounter (Signed)
Informed patient of upcoming home sleep study and patient understanding was verbalized. Patient understands her sleep study will be done at [place]. Patient understands she will receive a sleep CALL in a week or so. Patient understands to call if she does not receive the sleep CALL in a timely manner from [facility]. Patient agrees with treatment and thanked me for call.

## 2017-05-01 ENCOUNTER — Telehealth: Payer: Self-pay | Admitting: Nurse Practitioner

## 2017-05-01 NOTE — Telephone Encounter (Signed)
-----   Message from Theodoro Parma, RN sent at 05/01/2017  8:08 AM EDT -----   ----- Message ----- From: Sueanne Margarita, MD Sent: 04/29/2017   7:20 PM To: Theodoro Parma, RN, [facility] Triage  Please get patient into afib clinic this week  Fransico Him ----- Message ----- From: Means, Lolita Cram, MD Sent: 04/29/2017   6:10 PM To: Sueanne Margarita, MD  FYI [facility] called and your patient is having some paroxysms of AF in the low 100s and palpitations- saw your note-- no mention of [place]  Thanks, [employer]

## 2017-05-01 NOTE — Telephone Encounter (Signed)
Called patient about her monitor results and the need for an appointment in A Fib clinic. Patient complains of "bad episodes" over the last 4 days in part due to lack of electricity in her home. She states she has not pressed the button every time that she has felt the worsening palpitations but they have been frequent and are worse with activity. She states she feels that her symptoms have worsened since having the monitor put on. I scheduled her to see Roderic Palau, NP in the afib clinic tomorrow 10/16. She is aware of the parking code and how to get to the office. She thanked me for the call. I am faxing the monitor results to the A Fib clinic in preparation for her appointment tomorrow.

## 2017-05-02 ENCOUNTER — Ambulatory Visit (HOSPITAL_COMMUNITY)
Admission: RE | Admit: 2017-05-02 | Discharge: 2017-05-02 | Disposition: A | Discharge status: Home / Self Care | Payer: Medicare Other | Source: Ambulatory Visit | Attending: Nurse Practitioner | Admitting: Nurse Practitioner

## 2017-05-02 ENCOUNTER — Encounter (HOSPITAL_COMMUNITY): Payer: Self-pay | Admitting: Nurse Practitioner

## 2017-05-02 VITALS — BP 118/74 | HR 69 | Ht 66.0 in | Wt 191.6 lb

## 2017-05-02 DIAGNOSIS — I10 Essential (primary) hypertension: Secondary | ICD-10-CM | POA: Insufficient documentation

## 2017-05-02 DIAGNOSIS — I4891 Unspecified atrial fibrillation: Secondary | ICD-10-CM | POA: Diagnosis present

## 2017-05-02 DIAGNOSIS — I480 Paroxysmal atrial fibrillation: Secondary | ICD-10-CM

## 2017-05-02 DIAGNOSIS — Z87442 Personal history of urinary calculi: Secondary | ICD-10-CM | POA: Insufficient documentation

## 2017-05-02 DIAGNOSIS — Z79899 Other long term (current) drug therapy: Secondary | ICD-10-CM | POA: Insufficient documentation

## 2017-05-02 DIAGNOSIS — E785 Hyperlipidemia, unspecified: Secondary | ICD-10-CM | POA: Insufficient documentation

## 2017-05-02 DIAGNOSIS — Z9889 Other specified postprocedural states: Secondary | ICD-10-CM | POA: Diagnosis not present

## 2017-05-02 DIAGNOSIS — Z7901 Long term (current) use of anticoagulants: Secondary | ICD-10-CM | POA: Diagnosis not present

## 2017-05-02 MED ORDER — METOPROLOL SUCCINATE ER 25 MG PO TB24: 12.5000 mg | ORAL_TABLET | Freq: Every day | ORAL | 11 refills | Status: DC

## 2017-05-02 NOTE — Patient Instructions (Signed)
Your physician has recommended you make the following change in your medication:  1)Start Metoprolol to 12.5mg  once a day (1/2 tablet of the 25mg  tab)

## 2017-05-03 NOTE — Progress Notes (Signed)
Primary Care Physician: Rhonda Orn, MD Referring Physician: [facility] triage office Cardiologist: Rhonda [name] [name] is a 76 y.o. female with a h/o afib and remote afib ablation 12 years ago with Rhonda [name] that is in the afib clinic for evaluation. She was referred from Dr. Theodosia [name] office after monitor showed episodes of paroxysmal afib. Toprol XL 25 mg  was stopped in September with pt reporting some low BP readings. She has noted increase of afib since BB was stopped. She also had a stress test which showed a possible small area of ischemia and pt was started on Imdur 30 mg daily.Bp's have been OK on Imdur. She is in SR today. She is on Eliquis 5 mg bid for a chadsvasc score of at least 4.    Today, she denies symptoms of palpitations, chest pain, shortness of breath, orthopnea, PND, lower extremity edema, dizziness, presyncope, syncope, or neurologic sequela. The patient is tolerating medications without difficulties and is otherwise without complaint today.   Past Medical History:  Diagnosis Date  . Adenomatous colon polyp    cecum, last colonoscopy 2006  . Atrial tachycardia ([place])    non sustatined with PVCs and PACs  . Bacterial meningitis    as a child  . Basilar migraine   . Carotid stenosis, bilateral 10/13/2016   1-39% bilateral by dopplers 10/2016  . Cystocele    MIld  . History of hemorrhoids   . Hx of chest pain    Atypical  . Hx of hematuria    IVP 1990 left kidney stone, cystoscopy negative  . Hyperlipidemia   . Hypertension   . Nephrolithiasis   . Osteoporosis    actonel since 2006  . PAF (paroxysmal atrial fibrillation) ([place])    radiofrequency ablation of afib [facility]  . Right facial numbness 5/12   question TIA versus migraine,cardiac monitoring neg for AFIB  . Seizures ([place]) 1980   7 years on Dilantin  . Snoring   . SOB (shortness of breath)    Chronic mild, negative cardiolite  . Tinea versicolor    Past Surgical History:    Procedure Laterality Date  . multiple ankle surgeries      Current Outpatient Prescriptions  Medication Sig Dispense Refill  . acetaminophen (TYLENOL) 325 MG tablet Take 650 mg by mouth every 6 (six) hours as needed for mild pain.    Marland Kitchen ALPRAZolam (XANAX) 0.5 MG tablet Take 0.5 mg by mouth at bedtime as needed for anxiety. USES FOR SLEEP    . apixaban (ELIQUIS) 5 MG TABS tablet Take 1 tablet (5 mg total) by mouth 2 (two) times daily. 180 tablet 3  . calcium citrate-vitamin D (CITRACAL+D) 315-200 MG-UNIT per tablet Take 1 tablet by mouth daily.    . cycloSPORINE (RESTASIS) 0.05 % ophthalmic emulsion Place 1 drop into both eyes 2 (two) times daily.    . isosorbide mononitrate (IMDUR) 30 MG 24 hr tablet Take 1 tablet (30 mg total) by mouth daily. 30 tablet 11  . Multiple Vitamin (MULTIVITAMIN) tablet Take 1 tablet by mouth daily. With vitamin D    . omeprazole (PRILOSEC OTC) 20 MG tablet Take 20 mg by mouth as needed.    . ranitidine (ZANTAC) 150 MG tablet Take 150 mg by mouth 2 (two) times daily.    Marland Kitchen diltiazem (CARDIZEM) 30 MG tablet Take 1 tablet every 4 hours AS NEEDED for AFIB heart rate >100 (Patient not taking: Reported on 05/02/2017) 45 tablet 1  .  metoprolol succinate (TOPROL XL) 25 MG 24 hr tablet Take 0.5 tablets (12.5 mg total) by mouth daily. 30 tablet 11   No current facility-administered medications for this encounter.     Allergies  Allergen Reactions  . Erythromycin     Yeast infection    Social History   Social History  . Marital status: Married    Spouse name: N/A  . Number of children: N/A  . Years of education: N/A   Occupational History  . Not on file.   Social History Main Topics  . Smoking status: Never Smoker  . Smokeless tobacco: Never Used  . Alcohol use Yes     Comment: 1-2 glasses of wine weekly  . Drug use: No  . Sexual activity: Not on file   Other Topics Concern  . Not on file   Social History Narrative  . No narrative on file     Family History  Problem Relation Age of Onset  . Parkinson's disease Mother   . Heart attack Father     ROS- All systems are reviewed and negative except as per the HPI above  Physical Exam: Vitals:   05/02/17 1422  BP: 118/74  Pulse: 69  Weight: 191 lb 9.6 oz (86.9 kg)  Height: 5\' 6"  (1.676 m)   Wt Readings from Last 3 Encounters:  05/02/17 191 lb 9.6 oz (86.9 kg)  04/11/17 192 lb 3.2 oz (87.2 kg)  02/10/17 188 lb 12.8 oz (85.6 kg)    Labs: Lab Results  Component Value Date   NA 141 04/11/2017   K 4.1 04/11/2017   CL 104 04/11/2017   CO2 22 04/11/2017   GLUCOSE 82 04/11/2017   BUN 11 04/11/2017   CREATININE 0.70 04/11/2017   CALCIUM 9.3 04/11/2017   No results found for: INR No results found for: CHOL, HDL, LDLCALC, TRIG   GEN- The patient is well appearing, alert and oriented x 3 today.   Head- normocephalic, atraumatic Eyes-  Sclera clear, conjunctiva pink Ears- hearing intact Oropharynx- clear Neck- supple, no JVP Lymph- no cervical lymphadenopathy Lungs- Clear to ausculation bilaterally, normal work of breathing Heart- Regular rate and rhythm, no murmurs, rubs or gallops, PMI not laterally displaced GI- soft, NT, ND, + BS Extremities- no clubbing, cyanosis, or edema MS- no significant deformity or atrophy Skin- no rash or lesion Psych- euthymic mood, full affect Neuro- strength and sensation are intact  EKG-NSR at 62 bpm, Pr int 156 ms, qrs int 80 ms, qtc 416 ms Epic records reviewed Strips reviewed from event monitor     Assessment and Plan: 1. Paroxysmal afib More prevalent by event monitor since BB was stopped in September Continue eliquis for chadsvasc score of at least 4 Discussed options- repeat ablation- Multaq, thinks price may be prohibitive, price of drug($80) and may be more since pt is in the donut hole,-1c agents are poor options with possibility of CAD and pt does not want hospitalization for Tikosyn or sotalol. For now she  wants to try low dose BB to see if afib burden can be reduced and BP stay stable Continue event monitor  F/u in one week  Rhonda [name], [facility] [address] [place], [place] [address] [phone]

## 2017-05-08 NOTE — Progress Notes (Deleted)
Cardiology Office Note    Date:  05/08/2017   ID:  Rhonda [name], DOB 22-Sep-1940, MRN [id number]  PCP:  Rhonda Orn, MD  Cardiologist:  ***  Electrophysiologist: ***  Chief Complaint: Hospital follow up s/p    /  Months follow up  History of Present Illness:   Rhonda [name] is a 76 y.o. female ***   Past Medical History:  Diagnosis Date  . Adenomatous colon polyp    cecum, last colonoscopy 2006  . Atrial tachycardia ([place])    non sustatined with PVCs and PACs  . Bacterial meningitis    as a child  . Basilar migraine   . Carotid stenosis, bilateral 10/13/2016   1-39% bilateral by dopplers 10/2016  . Cystocele    MIld  . History of hemorrhoids   . Hx of chest pain    Atypical  . Hx of hematuria    IVP 1990 left kidney stone, cystoscopy negative  . Hyperlipidemia   . Hypertension   . Nephrolithiasis   . Osteoporosis    actonel since 2006  . PAF (paroxysmal atrial fibrillation) ([place])    radiofrequency ablation of afib bowman gray  . Right facial numbness 5/12   question TIA versus migraine,cardiac monitoring neg for AFIB  . Seizures ([place]) 1980   7 years on Dilantin  . Snoring   . SOB (shortness of breath)    Chronic mild, negative cardiolite  . Tinea versicolor     Past Surgical History:  Procedure Laterality Date  . multiple ankle surgeries      Current Medications: Prior to Admission medications   Medication Sig Start Date End Date Taking? Authorizing Provider  acetaminophen (TYLENOL) 325 MG tablet Take 650 mg by mouth every 6 (six) hours as needed for mild pain.    [provider]  ALPRAZolam Duanne Moron) 0.5 MG tablet Take 0.5 mg by mouth at bedtime as needed for anxiety. USES FOR SLEEP    [provider]  apixaban (ELIQUIS) 5 MG TABS tablet Take 1 tablet (5 mg total) by mouth 2 (two) times daily. 11/09/16   Rhonda Margarita, MD  calcium citrate-vitamin D (CITRACAL+D) 315-200 MG-UNIT per tablet Take 1 tablet by mouth daily.    [provider]  cycloSPORINE (RESTASIS) 0.05 % ophthalmic emulsion Place 1 drop into both eyes 2 (two) times daily.    [provider]  diltiazem (CARDIZEM) 30 MG tablet Take 1 tablet every 4 hours AS NEEDED for AFIB heart rate >100 Patient not taking: Reported on 05/02/2017 02/10/17   Rhonda Needs, NP  isosorbide mononitrate (IMDUR) 30 MG 24 hr tablet Take 1 tablet (30 mg total) by mouth daily. 04/21/17 04/19/18  Rhonda Margarita, MD  metoprolol succinate (TOPROL XL) 25 MG 24 hr tablet Take 0.5 tablets (12.5 mg total) by mouth daily. 05/02/17 05/02/18  Rhonda Needs, NP  Multiple Vitamin (MULTIVITAMIN) tablet Take 1 tablet by mouth daily. With vitamin D    [provider]  omeprazole (PRILOSEC OTC) 20 MG tablet Take 20 mg by mouth as needed.    [provider]  ranitidine (ZANTAC) 150 MG tablet Take 150 mg by mouth 2 (two) times daily.    [provider]    Allergies:   Erythromycin   Social History   Social History  . Marital status: Married    Spouse name: N/A  . Number of children: N/A  . Years of education: N/A   Social History Main Topics  .  Smoking status: Never Smoker  . Smokeless tobacco: Never Used  . Alcohol use Yes     Comment: 1-2 glasses of wine weekly  . Drug use: No  . Sexual activity: Not on file   Other Topics Concern  . Not on file   Social History Narrative  . No narrative on file     Family History:  The patient's family history includes Heart attack in her father; Parkinson's disease in her mother. ***  ROS:   Please see the history of present illness.    ROS All other systems reviewed and are negative.   PHYSICAL EXAM:   VS:  There were no vitals taken for this visit.   GEN: Well nourished, well developed, in no acute distress HEENT: normal Neck: no JVD, carotid bruits, or masses Cardiac: ***RRR; no murmurs, rubs, or gallops,no edema  Respiratory:  clear to auscultation bilaterally, normal work of  breathing GI: soft, nontender, nondistended, + BS MS: no deformity or atrophy Skin: warm and dry, no rash Neuro:  Alert and Oriented x 3, Strength and sensation are intact Psych: euthymic mood, full affect  Wt Readings from Last 3 Encounters:  05/02/17 191 lb 9.6 oz (86.9 kg)  04/11/17 192 lb 3.2 oz (87.2 kg)  02/10/17 188 lb 12.8 oz (85.6 kg)      Studies/Labs Reviewed:   EKG:  EKG is ordered today.  The ekg ordered today demonstrates ***  Recent Labs: 04/11/2017: BUN 11; Creatinine, Ser 0.70; Hemoglobin 12.7; Platelets 227; Potassium 4.1; Sodium 141   Lipid Panel No results found for: CHOL, TRIG, HDL, CHOLHDL, VLDL, LDLCALC, LDLDIRECT  Additional studies/ records that were reviewed today include:   Echocardiogram:  Cardiac Catheterization:     ASSESSMENT & PLAN:    1. ***    Medication Adjustments/Labs and Tests Ordered: Current medicines are reviewed at length with the patient today.  Concerns regarding medicines are outlined above.  Medication changes, Labs and Tests ordered today are listed in the Patient Instructions below. There are no Patient Instructions on file for this visit.   Rhonda [name], [place]  05/08/2017 1:41 PM    [facility] [address], [place], [place]  [address] Phone: [phone]; Fax: [phone]

## 2017-05-09 ENCOUNTER — Ambulatory Visit: Payer: Medicare Other | Admitting: Physician Assistant

## 2017-05-12 ENCOUNTER — Ambulatory Visit (HOSPITAL_COMMUNITY)
Admission: RE | Admit: 2017-05-12 | Discharge: 2017-05-12 | Disposition: A | Discharge status: Home / Self Care | Payer: Medicare Other | Source: Ambulatory Visit | Attending: Nurse Practitioner | Admitting: Nurse Practitioner

## 2017-05-12 ENCOUNTER — Encounter (HOSPITAL_COMMUNITY): Payer: Self-pay | Admitting: Nurse Practitioner

## 2017-05-12 VITALS — BP 132/80 | HR 64 | Ht 66.0 in | Wt 192.2 lb

## 2017-05-12 DIAGNOSIS — I10 Essential (primary) hypertension: Secondary | ICD-10-CM | POA: Diagnosis not present

## 2017-05-12 DIAGNOSIS — I480 Paroxysmal atrial fibrillation: Secondary | ICD-10-CM | POA: Diagnosis not present

## 2017-05-12 DIAGNOSIS — E785 Hyperlipidemia, unspecified: Secondary | ICD-10-CM | POA: Diagnosis not present

## 2017-05-12 DIAGNOSIS — Z79899 Other long term (current) drug therapy: Secondary | ICD-10-CM | POA: Diagnosis not present

## 2017-05-12 DIAGNOSIS — I4891 Unspecified atrial fibrillation: Secondary | ICD-10-CM | POA: Diagnosis present

## 2017-05-12 DIAGNOSIS — Z7901 Long term (current) use of anticoagulants: Secondary | ICD-10-CM | POA: Diagnosis not present

## 2017-05-12 DIAGNOSIS — Z9889 Other specified postprocedural states: Secondary | ICD-10-CM | POA: Diagnosis not present

## 2017-05-12 DIAGNOSIS — Z87442 Personal history of urinary calculi: Secondary | ICD-10-CM | POA: Insufficient documentation

## 2017-05-12 NOTE — Progress Notes (Signed)
Primary Care Physician: Lavone Orn, MD Referring Physician: [facility] triage office Cardiologist: Dr. Tilda Franco Rhonda is a 76 y.o. female with a h/o afib and remote afib ablation 12 years ago with Dr. Ola Spurr that is in the afib clinic for evaluation. She was referred from Dr. Theodosia Blender office after monitor showed episodes of paroxysmal afib. Toprol XL 25 mg  was stopped in September with pt reporting some low BP readings. She has noted increase of afib since BB was stopped. She also had a stress test which showed a possible small area of ischemia and pt was started on Imdur 30 mg daily.Bp's have been OK on Imdur. She is in SR today. She is on Eliquis 5 mg bid for a chadsvasc score of at least 4.   F/u in Afib clinic 10/26. She was started back on low dose BB, and was instructed to watch BP at home. On return today, she states that the palpations have resolved on the BB. Her BP has remained stable with BB and Imdur. The H/a's have finally stopped after getting used to the Imdur.    Today, she denies symptoms of palpitations, chest pain, shortness of breath, orthopnea, PND, lower extremity edema, dizziness, presyncope, syncope, or neurologic sequela. The patient is tolerating medications without difficulties and is otherwise without complaint today.   Past Medical History:  Diagnosis Date  . Adenomatous colon polyp    cecum, last colonoscopy 2006  . Atrial tachycardia ([place])    non sustatined with PVCs and PACs  . Bacterial meningitis    as a child  . Basilar migraine   . Carotid stenosis, bilateral 10/13/2016   1-39% bilateral by dopplers 10/2016  . Cystocele    MIld  . History of hemorrhoids   . Hx of chest pain    Atypical  . Hx of hematuria    IVP 1990 left kidney stone, cystoscopy negative  . Hyperlipidemia   . Hypertension   . Nephrolithiasis   . Osteoporosis    actonel since 2006  . PAF (paroxysmal atrial fibrillation) ([place])    radiofrequency ablation of  afib bowman gray  . Right facial numbness 5/12   question TIA versus migraine,cardiac monitoring neg for AFIB  . Seizures ([place]) 1980   7 years on Dilantin  . Snoring   . SOB (shortness of breath)    Chronic mild, negative cardiolite  . Tinea versicolor    Past Surgical History:  Procedure Laterality Date  . multiple ankle surgeries      Current Outpatient Prescriptions  Medication Sig Dispense Refill  . acetaminophen (TYLENOL) 325 MG tablet Take 650 mg by mouth every 6 (six) hours as needed for mild pain.    Marland Kitchen ALPRAZolam (XANAX) 0.5 MG tablet Take 0.5 mg by mouth at bedtime as needed for anxiety. USES FOR SLEEP    . apixaban (ELIQUIS) 5 MG TABS tablet Take 1 tablet (5 mg total) by mouth 2 (two) times daily. 180 tablet 3  . calcium citrate-vitamin D (CITRACAL+D) 315-200 MG-UNIT per tablet Take 1 tablet by mouth daily.    . cycloSPORINE (RESTASIS) 0.05 % ophthalmic emulsion Place 1 drop into both eyes 2 (two) times daily.    Marland Kitchen diltiazem (CARDIZEM) 30 MG tablet Take 1 tablet every 4 hours AS NEEDED for AFIB heart rate >100 45 tablet 1  . isosorbide mononitrate (IMDUR) 30 MG 24 hr tablet Take 1 tablet (30 mg total) by mouth daily. 30 tablet 11  . metoprolol succinate (TOPROL  XL) 25 MG 24 hr tablet Take 0.5 tablets (12.5 mg total) by mouth daily. 30 tablet 11  . Multiple Vitamin (MULTIVITAMIN) tablet Take 1 tablet by mouth daily. With vitamin D    . omeprazole (PRILOSEC OTC) 20 MG tablet Take 20 mg by mouth as needed.    . ranitidine (ZANTAC) 150 MG tablet Take 150 mg by mouth 2 (two) times daily.     No current facility-administered medications for this encounter.     Allergies  Allergen Reactions  . Erythromycin     Yeast infection    Social History   Social History  . Marital status: Married    Spouse name: N/A  . Number of children: N/A  . Years of education: N/A   Occupational History  . Not on file.   Social History Main Topics  . Smoking status: Never Smoker  .  Smokeless tobacco: Never Used  . Alcohol use Yes     Comment: 1-2 glasses of wine weekly  . Drug use: No  . Sexual activity: Not on file   Other Topics Concern  . Not on file   Social History Narrative  . No narrative on file    Family History  Problem Relation Age of Onset  . Parkinson's disease Mother   . Heart attack Father     ROS- All systems are reviewed and negative except as per the HPI above  Physical Exam: Vitals:   05/12/17 1108  BP: 132/80  Pulse: 64  Weight: 192 lb 3.2 oz (87.2 kg)  Height: 5\' 6"  (1.676 m)   Wt Readings from Last 3 Encounters:  05/12/17 192 lb 3.2 oz (87.2 kg)  05/02/17 191 lb 9.6 oz (86.9 kg)  04/11/17 192 lb 3.2 oz (87.2 kg)    Labs: Lab Results  Component Value Date   NA 141 04/11/2017   K 4.1 04/11/2017   CL 104 04/11/2017   CO2 22 04/11/2017   GLUCOSE 82 04/11/2017   BUN 11 04/11/2017   CREATININE 0.70 04/11/2017   CALCIUM 9.3 04/11/2017   No results found for: INR No results found for: CHOL, HDL, LDLCALC, TRIG   GEN- The patient is well appearing, alert and oriented x 3 today.   Head- normocephalic, atraumatic Eyes-  Sclera clear, conjunctiva pink Ears- hearing intact Oropharynx- clear Neck- supple, no JVP Lymph- no cervical lymphadenopathy Lungs- Clear to ausculation bilaterally, normal work of breathing Heart- Regular rate and rhythm, no murmurs, rubs or gallops, PMI not laterally displaced GI- soft, NT, ND, + BS Extremities- no clubbing, cyanosis, or edema MS- no significant deformity or atrophy Skin- no rash or lesion Psych- euthymic mood, full affect Neuro- strength and sensation are intact  EKG-NSR at 64 bpm, Pr int 154 ms, qrs int 84 ms, qtc 414 ms Epic records reviewed Strips reviewed from event monitor     Assessment and Plan: 1. Paroxysmal afib More prevalent by event monitor since BB was stopped in September Restart of low dose BB has resolved the afib episodes, BP stable on restart Continue  eliquis for chadsvasc score of at least 4   F/u with Dr. Radford Pax as scheduled afib clinic as needed  Butch Penny C. Clydean Posas, [facility] [address] [place], [place] [address] [phone]

## 2017-05-16 ENCOUNTER — Telehealth: Payer: Self-pay | Admitting: *Deleted

## 2017-05-16 DIAGNOSIS — G4733 Obstructive sleep apnea (adult) (pediatric): Secondary | ICD-10-CM

## 2017-05-16 DIAGNOSIS — I10 Essential (primary) hypertension: Secondary | ICD-10-CM
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
## 2017-05-16 NOTE — Telephone Encounter (Signed)
-----   Message from Sueanne Margarita, MD sent at 05/10/2017  4:24 PM EDT ----- Severe OSA with AHI 38/hr with oxygen desaturations to 86%.  Please set up for in lab CPAP titration

## 2017-05-17 ENCOUNTER — Encounter: Payer: Self-pay | Admitting: *Deleted

## 2017-05-17 NOTE — Addendum Note (Signed)
Addended by: Freada Bergeron on: 05/17/2017 04:38 PM   Modules accepted: Orders

## 2017-05-24 NOTE — Telephone Encounter (Signed)
Patient wants to wait on doing the CPAP Titration and she will speak with you at her next appointment.

## 2017-05-24 NOTE — Telephone Encounter (Signed)
This encounter was created in error - please disregard.

## 2017-05-30 ENCOUNTER — Telehealth: Payer: Self-pay | Admitting: *Deleted

## 2017-05-30 NOTE — Telephone Encounter (Signed)
-----   Message from Sueanne Margarita, MD sent at 05/24/2017 12:29 PM EST ----- I would rather she not wait has she has very bad sleep apnea

## 2017-05-30 NOTE — Telephone Encounter (Signed)
Patient says she has an appointment with the Afib doctors coming up and she wants to have that visit first so she does not get overwhelmed with so other many appointments. Patient thanked me for calling.

## 2017-05-30 NOTE — Addendum Note (Signed)
Encounter addended by: Sherran Needs, NP on: 05/30/2017 8:47 AM  Actions taken: LOS modified

## 2017-06-07 ENCOUNTER — Ambulatory Visit: Payer: Medicare Other | Admitting: Internal Medicine

## 2017-06-07 ENCOUNTER — Encounter: Payer: Self-pay | Admitting: Internal Medicine

## 2017-06-07 VITALS — BP 120/78 | HR 68 | Ht 66.0 in | Wt 192.6 lb

## 2017-06-07 DIAGNOSIS — I480 Paroxysmal atrial fibrillation: Secondary | ICD-10-CM | POA: Diagnosis not present

## 2017-06-07 DIAGNOSIS — G4733 Obstructive sleep apnea (adult) (pediatric): Secondary | ICD-10-CM

## 2017-06-07 NOTE — Progress Notes (Signed)
Electrophysiology Office Note   Date:  06/07/2017   ID:  Rhonda [name], DOB [birth year], MRN [id number]  PCP:  Lavone Orn, MD  Cardiologist:  Dr Radford Pax Primary Electrophysiologist: Thompson Grayer, MD    Chief Complaint  Patient presents with  . Atrial Fibrillation     History of Present Illness: Rhonda [name] is a 76 y.o. female who presents today for electrophysiology evaluation.   She is referred by Dr Radford Pax and Roderic Palau NP for EP consultation regarding her atrial fibrillation.  She has had afib "for years".  She underwent afib ablation by Dr Ola Spurr about 12 years ago.  She continues to have intermittent episodes of atrial fibrillation.  She reports palpitations and fatigue.  She subsequently saw Butch Penny in the AF clinic and metoprolol was improved.  She has done well since.  Currently, she feels that her afib is controlled.  Today, she denies symptoms of chest pain, shortness of breath, orthopnea, PND, lower extremity edema, claudication, dizziness, presyncope, syncope, bleeding, or neurologic sequela. The patient is tolerating medications without difficulties and is otherwise without complaint today.    Past Medical History:  Diagnosis Date  . Adenomatous colon polyp    cecum, last colonoscopy 2006  . Atrial tachycardia ([place])    non sustatined with PVCs and PACs  . Bacterial meningitis    as a child  . Basilar migraine   . Carotid stenosis, bilateral 10/13/2016   1-39% bilateral by dopplers 10/2016  . Cystocele    MIld  . History of hemorrhoids   . Hx of chest pain    Atypical  . Hx of hematuria    IVP 1990 left kidney stone, cystoscopy negative  . Hyperlipidemia   . Hypertension   . Nephrolithiasis   . Osteoporosis    actonel since 2006  . PAF (paroxysmal atrial fibrillation) ([place])    radiofrequency ablation of afib bowman gray  . Right facial numbness 5/12   question TIA versus migraine,cardiac monitoring neg for AFIB  . Seizures ([place]) 1980   7  years on Dilantin  . Snoring   . SOB (shortness of breath)    Chronic mild, negative cardiolite  . Tinea versicolor    Past Surgical History:  Procedure Laterality Date  . multiple ankle surgeries       Current Outpatient Medications  Medication Sig Dispense Refill  . acetaminophen (TYLENOL) 325 MG tablet Take 650 mg by mouth every 6 (six) hours as needed for mild pain.    Marland Kitchen ALPRAZolam (XANAX) 0.5 MG tablet Take 0.5 mg by mouth at bedtime as needed for anxiety. USES FOR SLEEP    . apixaban (ELIQUIS) 5 MG TABS tablet Take 1 tablet (5 mg total) by mouth 2 (two) times daily. 180 tablet 3  . calcium citrate-vitamin D (CITRACAL+D) 315-200 MG-UNIT per tablet Take 1 tablet by mouth daily.    . cycloSPORINE (RESTASIS) 0.05 % ophthalmic emulsion Place 1 drop into both eyes daily.     Marland Kitchen diltiazem (CARDIZEM) 30 MG tablet Take 1 tablet every 4 hours AS NEEDED for AFIB heart rate >100 45 tablet 1  . isosorbide mononitrate (IMDUR) 30 MG 24 hr tablet Take 1 tablet (30 mg total) by mouth daily. 30 tablet 11  . metoprolol succinate (TOPROL XL) 25 MG 24 hr tablet Take 0.5 tablets (12.5 mg total) by mouth daily. 30 tablet 11  . omeprazole (PRILOSEC OTC) 20 MG tablet Take 20 mg by mouth as needed.    . ranitidine (ZANTAC)  150 MG tablet Take 150 mg by mouth 2 (two) times daily as needed.      No current facility-administered medications for this visit.     Allergies:   Erythromycin   Social History:  The patient  reports that  has never smoked. she has never used smokeless tobacco. She reports that she drinks alcohol. She reports that she does not use drugs.   Family History:  The patient's  family history includes Heart attack in her father; Parkinson's disease in her mother.    ROS:  Please see the history of present illness.   All other systems are personally reviewed and negative.    PHYSICAL EXAM: VS:  BP 120/78   Pulse 68   Ht 5\' 6"  (1.676 m)   Wt 192 lb 9.6 oz (87.4 kg)   SpO2 98%   BMI  31.09 kg/m  , BMI Body mass index is 31.09 kg/m. GEN: Well nourished, well developed, in no acute distress  HEENT: normal  Neck: no JVD, carotid bruits, or masses Cardiac: RRR; no murmurs, rubs, or gallops,no edema  Respiratory:  clear to auscultation bilaterally, normal work of breathing GI: soft, nontender, nondistended, + BS MS: no deformity or atrophy  Skin: warm and dry  Neuro:  Strength and sensation are intact Psych: euthymic mood, full affect  EKG:  EKG is ordered today. The ekg ordered today is personally reviewed and shows sinus rhythm 68 bpm, normal ekg   Recent Labs: 04/11/2017: BUN 11; Creatinine, Ser 0.70; Hemoglobin 12.7; Platelets 227; Potassium 4.1; Sodium 141  personally reviewed   Lipid Panel  No results found for: CHOL, TRIG, HDL, CHOLHDL, VLDL, LDLCALC, LDLDIRECT personally reviewed   Wt Readings from Last 3 Encounters:  06/07/17 192 lb 9.6 oz (87.4 kg)  05/12/17 192 lb 3.2 oz (87.2 kg)  05/02/17 191 lb 9.6 oz (86.9 kg)      Other studies personally reviewed: Additional studies/ records that were reviewed today include: AF clinic notes, prior echo, Dr Landis Gandy notes  Review of the above records today demonstrates: as above   ASSESSMENT AND PLAN:  1.  Paroxysmal atrial fibrillation The patient has symptomatic recurrent atrial fibrillation.  She has failed medical therapy and is s/p afib ablation by Dr Ola Spurr 12 years ago.  With recent change to metoprolol, she is currently feeling well.  I did discuss ablation and also AAD options today.  She is very clear that she does not wish to make any changes or consider ablation at this time. Continue eliquis for chads2vasc score of 4  2. OSA Following with Dr Radford Pax  Follow-up with Dr Radford Pax as scheduled I will see as needed going forward  Current medicines are reviewed at length with the patient today.   The patient does not have concerns regarding her medicines.  The following changes were made today:   None   Signed, Thompson Grayer, MD  06/07/2017 10:43 AM     [facility] [address] [place] [address] [phone] (office) [phone] (fax)

## 2017-06-07 NOTE — Patient Instructions (Addendum)
Medication Instructions:  Your physician recommends that you continue on your current medications as directed. Please refer to the Current Medication list given to you today.  -- If you need a refill on your cardiac medications before your next appointment, please call your pharmacy. --  Labwork: None ordered  Testing/Procedures: None ordered  Follow-Up: Your physician wants you to follow-up as needed with Dr. Allred.     You will receive a reminder letter in the mail two months in advance. If you don't receive a letter, please call our office to schedule the follow-up appointment.  Thank you for choosing [facility]!!   Romulus Hanrahan, RN [phone]  Any Other Special Instructions Will Be Listed Below (If Applicable).         

## 2017-06-13 ENCOUNTER — Encounter: Payer: Self-pay | Admitting: *Deleted

## 2017-06-13 NOTE — Telephone Encounter (Signed)
Patient called back today to say she wants to reschedule her  CPAP Titration from 06/29/2017 to the middle of January. Patients Titration has been rescheduled for Wednesday August 02 2017.  Patient has been notified and agrees with treatment.

## 2017-06-29 ENCOUNTER — Encounter (HOSPITAL_BASED_OUTPATIENT_CLINIC_OR_DEPARTMENT_OTHER): Payer: Medicare Other

## 2017-08-02 ENCOUNTER — Ambulatory Visit (HOSPITAL_BASED_OUTPATIENT_CLINIC_OR_DEPARTMENT_OTHER): Payer: Medicare Other | Attending: Cardiology | Admitting: Cardiology

## 2017-08-02 VITALS — Ht 66.0 in | Wt 195.0 lb

## 2017-08-02 DIAGNOSIS — G4733 Obstructive sleep apnea (adult) (pediatric): Secondary | ICD-10-CM | POA: Insufficient documentation

## 2017-08-02 DIAGNOSIS — R0902 Hypoxemia: Secondary | ICD-10-CM | POA: Diagnosis not present

## 2017-08-02 DIAGNOSIS — G4731 Primary central sleep apnea: Secondary | ICD-10-CM | POA: Diagnosis not present

## 2017-08-03 NOTE — Procedures (Signed)
   NAME: Rhonda [name] DATE OF BIRTH:  11-28-1940 MEDICAL RECORD NUMBER [id number]  LOCATION: [facility]  PHYSICIAN: Cutter Passey  DATE OF STUDY: 08/02/2017  SLEEP STUDY TYPE: Positive Airway Pressure Titration               REFERRING PHYSICIAN: Sueanne Margarita, MD   CLINICAL INFORMATION The patient is referred for a BiPAP titration to treat sleep apnea.???????  SLEEP STUDY TECHNIQUE As per the AASM Manual for the Scoring of Sleep and Associated Events v2.3 (April 2016) with a hypopnea requiring 4% desaturations.  The channels recorded and monitored were frontal, central and occipital EEG, electrooculogram (EOG), submentalis EMG (chin), nasal and oral airflow, thoracic and abdominal wall motion, anterior tibialis EMG, snore microphone, electrocardiogram, and pulse oximetry. Bilevel positive airway pressure (BPAP) was initiated at the beginning of the study and titrated to treat sleep-disordered breathing.  MEDICATIONS Medications self-administered by patient taken the night of the study : TOPROL XL, ELIQUIS, TYLENOL  RESPIRATORY PARAMETERS Optimal IPAP Pressure (cm): N/A  AHI at Optimal Pressure (/hr) N/A Optimal EPAP Pressure (cm):N/A  Overall Minimal O2 (%):N/A  Minimal O2 at Optimal Pressure (%): N/A  SLEEP ARCHITECTURE Start Time:10:07:59 PM  Stop Time:5:49:04 AM  Total Time (min):461.1  Total Sleep Time (min):243.0 Sleep Latency (min):77.5  Sleep Efficiency (%):52.7  REM Latency (min):156.0  WASO (min):140.6 Stage N1 (%):12.96  Stage N2 (%): 64.40  Stage N3 (%): 0.00  Stage R (%):22.63 Supine (%):63.37  Arousal Index (/hr):34.3   CARDIAC DATA The 2 lead EKG demonstrated sinus rhythm. The mean heart rate was 51.64 beats per minute. Other EKG findings include: None.  LEG MOVEMENT DATA The total Periodic Limb Movements of Sleep (PLMS) were 0. The PLMS index was 0.00. A PLMS index of <15 is considered normal in adults.  IMPRESSIONS - An  optimal PAP pressure could not be selected for this patient due to ongoing respiratory events.  - Mild Central Sleep Apnea was noted during this titration (CAI = 10.4/h). - Moderete oxygen desaturations were observed during this titration (min O2 = 86.00%). - No snoring was audible during this study. - No cardiac abnormalities were observed during this study. - Clinically significant periodic limb movements were not noted during this study. Arousals associated with PLMs were rare.  DIAGNOSIS - Obstructive Sleep Apnea (327.23 [G47.33 ICD-10]) - Nocturnal Hypoxemia  RECOMMENDATIONS - BiPAP titration in lab. May require S/T. - Avoid alcohol, sedatives and other CNS depressants that may worsen sleep apnea and disrupt normal sleep architecture. - Sleep hygiene should be reviewed to assess factors that may improve sleep quality. - Weight management and regular exercise should be initiated or continued.  [place], American Board of Sleep Medicine  ELECTRONICALLY SIGNED ON:  08/03/2017, 9:09 PM [place] PH: [phone]   FX: [phone]

## 2017-08-21 ENCOUNTER — Telehealth: Payer: Self-pay | Admitting: *Deleted

## 2017-08-21 DIAGNOSIS — G4733 Obstructive sleep apnea (adult) (pediatric): Secondary | ICD-10-CM

## 2017-08-21 NOTE — Telephone Encounter (Signed)
-----   Message from Sueanne Margarita, MD sent at 08/03/2017  9:23 PM EST ----- Please let patient know that they had an unsuccessful PAP titration and setup BiPAP titration with S/T if needed.

## 2017-08-21 NOTE — Telephone Encounter (Signed)
Informed patient of sleep study results and patient understanding was verbalized. Patient understands her titration was unsuccessful and Dr Radford Pax recommends a BiPAP titration with S/T if needed. Patient has refused to retest anymore. Patient says she does not believe that she has sleep apnea. Patient is very adamant that she does not want to wear a CPAP at all.

## 2017-09-04 ENCOUNTER — Other Ambulatory Visit: Payer: Self-pay | Admitting: Cardiology

## 2017-09-05 NOTE — Telephone Encounter (Signed)
Eliquis 5mg  refill request received, pt is 77 yrs old, wt-87.4kg, Last seen by Dr. Rayann Heman on 06/07/17 & dr. Radford Pax on 04/11/17, Crea-0.70 on 04/11/17; will send in refill to requested pharmacy.

## 2017-10-26 ENCOUNTER — Encounter: Payer: Self-pay | Admitting: Cardiology

## 2017-10-26 ENCOUNTER — Other Ambulatory Visit: Payer: Self-pay | Admitting: Cardiology

## 2017-10-26 ENCOUNTER — Ambulatory Visit: Payer: Medicare Other | Admitting: Cardiology

## 2017-10-26 VITALS — BP 124/76 | HR 61 | Wt 192.2 lb

## 2017-10-26 DIAGNOSIS — G4733 Obstructive sleep apnea (adult) (pediatric): Secondary | ICD-10-CM | POA: Diagnosis not present

## 2017-10-26 DIAGNOSIS — E785 Hyperlipidemia, unspecified: Secondary | ICD-10-CM | POA: Diagnosis not present

## 2017-10-26 DIAGNOSIS — I481 Persistent atrial fibrillation: Secondary | ICD-10-CM | POA: Diagnosis not present

## 2017-10-26 DIAGNOSIS — I10 Essential (primary) hypertension: Secondary | ICD-10-CM

## 2017-10-26 DIAGNOSIS — I4819 Other persistent atrial fibrillation: Secondary | ICD-10-CM

## 2017-10-26 DIAGNOSIS — I6523 Occlusion and stenosis of bilateral carotid arteries: Secondary | ICD-10-CM

## 2017-10-26 HISTORY — DX: Hyperlipidemia, unspecified: E78.5

## 2017-10-26 HISTORY — DX: Obstructive sleep apnea (adult) (pediatric): G47.33

## 2017-10-26 MED ORDER — APIXABAN 5 MG PO TABS: 5.0000 mg | ORAL_TABLET | Freq: Two times a day (BID) | ORAL | 3 refills | Status: DC

## 2017-10-26 MED ORDER — METOPROLOL SUCCINATE ER 25 MG PO TB24: 12.5000 mg | ORAL_TABLET | Freq: Every day | ORAL | 3 refills | Status: DC

## 2017-10-26 MED ORDER — ISOSORBIDE MONONITRATE ER 30 MG PO TB24: 30.0000 mg | ORAL_TABLET | Freq: Every day | ORAL | 11 refills | Status: DC

## 2017-10-26 NOTE — Progress Notes (Signed)
Cardiology Office Note:    Date:  10/26/2017   ID:  Rhonda [name], DOB [birth year], MRN [id number]  PCP:  Lavone Orn, MD  Cardiologist:  No primary care provider on file.    Referring MD: Lavone Orn, MD   Chief Complaint  Patient presents with  . Atrial Fibrillation    History of Present Illness:    Rhonda [name] is a 77 y.o. female with a hx of persistent AF s/p afib ablation 12 years ago,  bilateral carotid stenosis <50%, PVC's, PAC's and nonsustained atrial tachycardia.  She continues to have intermittent episodes of atrial fibrillation and was recently referred to A. fib clinic as well as Dr. Rayann Heman in EP clinic.  Repeat A. fib ablation versus AAD therapy were discussed with the patient and she was adamant she did not want to consider ablation or any other drugs.  She has a CHADS2VASC score of 4 and is on Eliquis for anticoagulation.  Was referred for sleep study by A. fib clinic and underwent home sleep study showing severe sleep apnea with an AHI of 38.1/h.  She underwent CPAP titration but cannot be adequately titrated due to ongoing respiratory events.  A BiPAP titration was recommended but patient refused any further studies and stated that she was not going to use a Pap device.    She is here today for followup and is doing well.  She denies any chest pain or pressure,  PND, orthopnea, LE edema. She says that recently her palpitations have settled down and she has not really had any problems with them.  She has chronic DOE that she thinks is stable and does not interfere with her daily activities.  She is fairly sedentary as well and notices it more when she goes out in the yard to work.  She occasionally has some problems with orthostatic hypotension and notices it mainly at church if she has been standing too long.  She has not had any syncope.  She is compliant with her meds and is tolerating meds with no SE.    Past Medical History:  Diagnosis Date  . Adenomatous colon  polyp    cecum, last colonoscopy 2006  . Atrial tachycardia ([place])    non sustatined with PVCs and PACs  . Bacterial meningitis    as a child  . Basilar migraine   . Carotid stenosis, bilateral 10/13/2016   1-39% bilateral by dopplers 10/2016  . Cystocele    MIld  . History of hemorrhoids   . Hx of chest pain    Atypical  . Hx of hematuria    IVP 1990 left kidney stone, cystoscopy negative  . Hyperlipidemia   . Hyperlipidemia LDL goal <70 10/26/2017  . Hypertension   . Nephrolithiasis   . OSA (obstructive sleep apnea) 10/26/2017   Severe with AHI 38/hr.  Patient refuses PAP therapy  . Osteoporosis    actonel since 2006  . PAF (paroxysmal atrial fibrillation) ([place])    radiofrequency ablation of afib [facility]  . Right facial numbness 5/12   question TIA versus migraine,cardiac monitoring neg for AFIB  . Seizures ([place]) 1980   7 years on Dilantin  . Snoring   . SOB (shortness of breath)    Chronic mild, negative cardiolite  . Tinea versicolor     Past Surgical History:  Procedure Laterality Date  . multiple ankle surgeries      Current Medications: Current Meds  Medication Sig  . acetaminophen (TYLENOL) 325 MG tablet  Take 650 mg by mouth every 6 (six) hours as needed for mild pain.  Marland Kitchen ALPRAZolam (XANAX) 0.5 MG tablet Take 0.5 mg by mouth at bedtime as needed for anxiety. USES FOR SLEEP  . calcium citrate-vitamin D (CITRACAL+D) 315-200 MG-UNIT per tablet Take 1 tablet by mouth daily.  . cycloSPORINE (RESTASIS) 0.05 % ophthalmic emulsion Place 1 drop into both eyes daily.   Marland Kitchen ELIQUIS 5 MG TABS tablet TAKE 1 TABLET BY MOUTH TWO  TIMES DAILY  . isosorbide mononitrate (IMDUR) 30 MG 24 hr tablet Take 1 tablet (30 mg total) by mouth daily.  . metoprolol succinate (TOPROL XL) 25 MG 24 hr tablet Take 0.5 tablets (12.5 mg total) by mouth daily.  Marland Kitchen omeprazole (PRILOSEC OTC) 20 MG tablet Take 20 mg by mouth as needed.  . ranitidine (ZANTAC) 150 MG tablet Take 150 mg by mouth 2  (two) times daily as needed.      Allergies:   Erythromycin   Social History   Socioeconomic History  . Marital status: Married    Spouse name: Not on file  . Number of children: Not on file  . Years of education: Not on file  . Highest education level: Not on file  Occupational History  . Not on file  Social Needs  . Financial resource strain: Not on file  . Food insecurity:    Worry: Not on file    Inability: Not on file  . Transportation needs:    Medical: Not on file    Non-medical: Not on file  Tobacco Use  . Smoking status: Never Smoker  . Smokeless tobacco: Never Used  Substance and Sexual Activity  . Alcohol use: Yes    Comment: 1-2 glasses of wine weekly  . Drug use: No  . Sexual activity: Not on file  Lifestyle  . Physical activity:    Days per week: Not on file    Minutes per session: Not on file  . Stress: Not on file  Relationships  . Social connections:    Talks on phone: Not on file    Gets together: Not on file    Attends religious service: Not on file    Active member of club or organization: Not on file    Attends meetings of clubs or organizations: Not on file    Relationship status: Not on file  Other Topics Concern  . Not on file  Social History Narrative  . Not on file     Family History: The patient's family history includes Heart attack in her father; Parkinson's disease in her mother.  ROS:   Please see the history of present illness.    ROS  All other systems reviewed and negative.   EKGs/Labs/Other Studies Reviewed:    The following studies were reviewed today: Home sleep study and CPAP titration  EKG:  EKG is  ordered today and showed NSR at 61bpm with no ST changes  Recent Labs: 04/11/2017: BUN 11; Creatinine, Ser 0.70; Hemoglobin 12.7; Platelets 227; Potassium 4.1; Sodium 141   Recent Lipid Panel No results found for: CHOL, TRIG, HDL, CHOLHDL, VLDL, LDLCALC, LDLDIRECT  Physical Exam:    VS:  BP 124/76   Pulse 61    Wt 192 lb 3.2 oz (87.2 kg)   BMI 31.02 kg/m     Wt Readings from Last 3 Encounters:  10/26/17 192 lb 3.2 oz (87.2 kg)  08/02/17 195 lb (88.5 kg)  06/07/17 192 lb 9.6 oz (87.4 kg)  GEN:  Well nourished, well developed in no acute distress HEENT: Normal NECK: No JVD; No carotid bruits LYMPHATICS: No lymphadenopathy CARDIAC: RRR, no murmurs, rubs, gallops RESPIRATORY:  Clear to auscultation without rales, wheezing or rhonchi  ABDOMEN: Soft, non-tender, non-distended MUSCULOSKELETAL:  No edema; No deformity  SKIN: Warm and dry NEUROLOGIC:  Alert and oriented x 3 PSYCHIATRIC:  Normal affect   ASSESSMENT:    1. Persistent atrial fibrillation ([place])   2. Benign essential HTN   3. Carotid stenosis, bilateral   4. OSA (obstructive sleep apnea)   5. Hyperlipidemia LDL goal <70    PLAN:    In order of problems listed above:  1.  Persistent atrial fibrillation - status post remote A. fib ablation by Dr. Ola Spurr years ago.  She continues to have symptomatic breakthrough of atrial fibrillation.  She is in NSR today.  She was recently evaluated in A. fib clinic as well as by Dr. Rayann Heman and refused further recommendations for A. fib ablation versus initiation of antiarrhythmic drug therapy.  She will continue Cardizem 30 mg as needed for breakthrough as well as Toprol-XL 12.5 mg daily.  She will continue on Eliquis 5 mg twice daily.  I will check a bmet and hemoglobin today.  2.  Hypertension -blood pressure is well controlled on exam today.  She will continue on Toprol XL 12.5 mg daily.  3.  Bilateral carotid artery stenosis -Dopplers 10/27/2016 showed 1-39% carotid stenosis bilaterally.  We will repeat these in April 2020.  She is not on aspirin due to Eliquis.  4.  Sleep apnea - home sleep study showed severe obstructive sleep apnea with an AHI of 38.  She had a failed CPAP titration due to ongoing respiratory events and BiPAP was recommended but patient refuses to have any further  testing and states she is not going to use a Pap device.  I did explain to her the association between A. fib and untreated sleep apnea but she is adamant she is not going to use a Pap device.  5.  Hyperlipidemia with LDL goal < 70.  Her last LDL was 126 on 08/16/2017. She stopped her her statin due to leg weakness and feels better since being off of it.    Medication Adjustments/Labs and Tests Ordered: Current medicines are reviewed at length with the patient today.  Concerns regarding medicines are outlined above.  No orders of the defined types were placed in this encounter.  No orders of the defined types were placed in this encounter.   Signed, Fransico Him, MD  10/26/2017 11:50 AM    [place]

## 2017-10-26 NOTE — Addendum Note (Signed)
Addended by: Teressa Senter on: 10/26/2017 01:38 PM   Modules accepted: Orders

## 2017-10-26 NOTE — Patient Instructions (Signed)
Medication Instructions:  Your physician recommends that you continue on your current medications as directed. Please refer to the Current Medication list given to you today.  If you need a refill on your cardiac medications, please contact your pharmacy first.  Labwork: Today for kidney function and complete blood count   Testing/Procedures: None ordered   Follow-Up: Your physician wants you to follow-up in: 6 months with PA. You will receive a reminder letter in the mail two months in advance. If you don't receive a letter, please call our office to schedule the follow-up appointment.  Your physician wants you to follow-up in: 1 year with Dr. Radford Pax. You will receive a reminder letter in the mail two months in advance. If you don't receive a letter, please call our office to schedule the follow-up appointment.  Any Other Special Instructions Will Be Listed Below (If Applicable).   Thank you for choosing [facility], RN  [phone]  If you need a refill on your cardiac medications before your next appointment, please call your pharmacy.

## 2017-10-27 ENCOUNTER — Other Ambulatory Visit: Payer: Self-pay

## 2017-10-27 LAB — BASIC METABOLIC PANEL
BUN/Creatinine Ratio: 19 (ref 12–28)
BUN: 13 mg/dL (ref 8–27)
CALCIUM: 9.2 mg/dL (ref 8.7–10.3)
CHLORIDE: 105 mmol/L (ref 96–106)
CO2: 23 mmol/L (ref 20–29)
Creatinine, Ser: 0.7 mg/dL (ref 0.57–1.00)
GFR, EST AFRICAN AMERICAN: 98 mL/min/{1.73_m2} (ref >59)
GFR, EST NON AFRICAN AMERICAN: 85 mL/min/{1.73_m2} (ref >59)
Glucose: 85 mg/dL (ref 65–99)
Potassium: 4.6 mmol/L (ref 3.5–5.2)
Sodium: 141 mmol/L (ref 134–144)

## 2017-10-27 LAB — CBC
HEMATOCRIT: 36.8 % (ref 34.0–46.6)
HEMOGLOBIN: 12 g/dL (ref 11.1–15.9)
MCH: 30.6 pg (ref 26.6–33.0)
MCHC: 32.6 g/dL (ref 31.5–35.7)
MCV: 94 fL (ref 79–97)
Platelets: 214 10*3/uL (ref 150–379)
RBC: 3.92 x10E6/uL (ref 3.77–5.28)
RDW: 14.5 % (ref 12.3–15.4)
WBC: 6.2 10*3/uL (ref 3.4–10.8)

## 2017-10-27 MED ORDER — ISOSORBIDE MONONITRATE ER 30 MG PO TB24: 30.0000 mg | ORAL_TABLET | Freq: Every day | ORAL | 3 refills | Status: DC

## 2017-10-27 NOTE — Addendum Note (Signed)
Addended by: De Burrs on: 10/27/2017 09:03 AM   Modules accepted: Orders

## 2018-03-15 ENCOUNTER — Telehealth: Payer: Self-pay | Admitting: Cardiology

## 2018-03-15 NOTE — Telephone Encounter (Signed)
Pt called to report that she has had her BP fluctuating up and down...  Yesterday:  At lunch 104/60                         Dinner 65/50                         Bedtime 98/60  Today  Lunch  143/87  She has changed her battery on her BP cuff. She feels dizzy and sob when her BP is under 268 systolic.  Heart rate staying in the 70's.   Takes.. Metoprolol 12.5 mg at bedtime               Imdur 30mg  at 1pm                Diltiazem 30mg  prn AFIB    She had 6 hour episode of AFIb last Monday but converted out and felt better. She took her Diltiazem but it took about 4 hours after taking it for it to relax.   She is stressed about her BP. She denies that she has been sick recently and she says she has been hydrating well. I advised her that I will forward to Dr. Radford Pax and will talk with her about Dr. Theodosia Blender recommendations.

## 2018-03-15 NOTE — Telephone Encounter (Signed)
New Message   Pt c/o BP issue:  1. What are your last 5 BP readings? 143/87, 98/60, 104/60, 65/50 2. Are you having any other symptoms (ex. Dizziness, headache, blurred vision, passed out)? Dizziness, feeling faint 3. What is your medication issue? None   Patient states that her BP has been going up and down the past few days. One moment up and then the next down. Please call to discuss.

## 2018-03-16 NOTE — Telephone Encounter (Signed)
Please have her stop Toprol and Imdur.  Recommend compression hose during the day and stay hydrated.  Please get her  back in afib clinic next week.

## 2018-03-16 NOTE — Telephone Encounter (Signed)
Spoke with patient and informed her to stop her Toprol and Imdur. The patient stated she has been wearing compression hose and staying hydrated. Informed her about a follow up appointment with afib clinic in the next week.   Sending to Rushie Goltz for scheduling.

## 2018-03-20 ENCOUNTER — Encounter (HOSPITAL_COMMUNITY): Payer: Self-pay | Admitting: *Deleted

## 2018-03-20 ENCOUNTER — Emergency Department (HOSPITAL_COMMUNITY)
Admission: EM | Admit: 2018-03-20 | Discharge: 2018-03-20 | Disposition: A | Discharge status: Home / Self Care | Payer: Medicare Other | Attending: Emergency Medicine | Admitting: Emergency Medicine

## 2018-03-20 ENCOUNTER — Emergency Department (HOSPITAL_COMMUNITY): Payer: Medicare Other

## 2018-03-20 DIAGNOSIS — I480 Paroxysmal atrial fibrillation: Secondary | ICD-10-CM | POA: Insufficient documentation

## 2018-03-20 DIAGNOSIS — Z79899 Other long term (current) drug therapy: Secondary | ICD-10-CM | POA: Insufficient documentation

## 2018-03-20 DIAGNOSIS — I10 Essential (primary) hypertension: Secondary | ICD-10-CM | POA: Diagnosis not present

## 2018-03-20 DIAGNOSIS — I4891 Unspecified atrial fibrillation: Secondary | ICD-10-CM

## 2018-03-20 DIAGNOSIS — R000 Tachycardia, unspecified: Secondary | ICD-10-CM | POA: Diagnosis present

## 2018-03-20 LAB — BASIC METABOLIC PANEL
Anion gap: 8 (ref 5–15)
BUN: 13 mg/dL (ref 8–23)
CO2: 22 mmol/L (ref 22–32)
Calcium: 9.2 mg/dL (ref 8.9–10.3)
Chloride: 109 mmol/L (ref 98–111)
Creatinine, Ser: 0.76 mg/dL (ref 0.44–1.00)
GFR calc Af Amer: >60 mL/min (ref >60)
Glucose, Bld: 99 mg/dL (ref 70–99)
Potassium: 4.8 mmol/L (ref 3.5–5.1)
SODIUM: 139 mmol/L (ref 135–145)

## 2018-03-20 LAB — CBC
HCT: 42.7 % (ref 36.0–46.0)
Hemoglobin: 13.7 g/dL (ref 12.0–15.0)
MCH: 30.4 pg (ref 26.0–34.0)
MCHC: 32.1 g/dL (ref 30.0–36.0)
MCV: 94.9 fL (ref 78.0–100.0)
PLATELETS: 174 10*3/uL (ref 150–400)
RBC: 4.5 MIL/uL (ref 3.87–5.11)
RDW: 13.2 % (ref 11.5–15.5)
WBC: 5.5 10*3/uL (ref 4.0–10.5)

## 2018-03-20 LAB — I-STAT TROPONIN, ED
Troponin i, poc: 0.02 ng/mL (ref 0.00–0.08)
Troponin i, poc: 0.03 ng/mL (ref 0.00–0.08)

## 2018-03-20 MED ORDER — DILTIAZEM HCL 25 MG/5ML IV SOLN
20.0000 mg | Freq: Once | INTRAVENOUS | Status: AC
  Administered 2018-03-20: 20 mg via INTRAVENOUS
  Filled 2018-03-20: qty 5

## 2018-03-20 MED ORDER — DILTIAZEM HCL 25 MG/5ML IV SOLN
10.0000 mg | Freq: Once | INTRAVENOUS | Status: AC
  Administered 2018-03-20: 10 mg via INTRAVENOUS
  Filled 2018-03-20: qty 5

## 2018-03-20 NOTE — ED Triage Notes (Signed)
Pt in stating she is in afib, developed palpitations at 7am, c/o chest pain earlier but that has improved at this time, denies n/v

## 2018-03-20 NOTE — ED Notes (Signed)
Notified Brie, RN that pt is tachycardic.

## 2018-03-20 NOTE — Discharge Instructions (Addendum)
Please return for any problem.  Follow-up with your regular cardiologist as instructed.

## 2018-03-20 NOTE — ED Provider Notes (Signed)
[facility] EMERGENCY DEPARTMENT Provider Note   CSN: [id number] Arrival date & time: 03/20/18  0857     History   Chief Complaint Chief Complaint  Patient presents with  . Tachycardia    HPI Rhonda [name] is a 77 y.o. female.  77 year old female with prior medical history of below presents with complaint of palpitations and chest discomfort.  Patient reports long-standing history of intermittent atrial fibrillation.  She reports that this morning she felt like her heart was racing which is consistent with her atrial fibrillation episodes.  These palpitations were accompanied by substernal chest discomfort.  She does not normally get such chest discomfort.  She describes sharp substernal chest pressure that lasted approximately 1 hour.  By the time of arrival to the ED her chest pain had resolved.  She denies associated shortness of breath.   The history is provided by the patient and medical records.  Chest Pain   This is a new problem. The current episode started 3 to 5 hours ago. The problem occurs rarely. The problem has been resolved. Associated with: palpitations, afib  The pain is present in the substernal region. The pain is mild. The quality of the pain is described as pressure-like and sharp. The pain does not radiate. Duration of episode(s) is 1 hour. Associated symptoms include palpitations. She has tried nothing for the symptoms.    Past Medical History:  Diagnosis Date  . Adenomatous colon polyp    cecum, last colonoscopy 2006  . Atrial tachycardia ([place])    non sustatined with PVCs and PACs  . Bacterial meningitis    as a child  . Basilar migraine   . Carotid stenosis, bilateral 10/13/2016   1-39% bilateral by dopplers 10/2016  . Cystocele    MIld  . History of hemorrhoids   . Hx of chest pain    Atypical  . Hx of hematuria    IVP 1990 left kidney stone, cystoscopy negative  . Hyperlipidemia   . Hyperlipidemia LDL goal <70 10/26/2017  .  Hypertension   . Nephrolithiasis   . OSA (obstructive sleep apnea) 10/26/2017   Severe with AHI 38/hr.  Patient refuses PAP therapy  . Osteoporosis    actonel since 2006  . PAF (paroxysmal atrial fibrillation) ([place])    radiofrequency ablation of afib [facility]  . Right facial numbness 5/12   question TIA versus migraine,cardiac monitoring neg for AFIB  . Seizures ([place]) 1980   7 years on Dilantin  . Snoring   . SOB (shortness of breath)    Chronic mild, negative cardiolite  . Tinea versicolor     Patient Active Problem List   Diagnosis Date Noted  . OSA (obstructive sleep apnea) 10/26/2017  . Hyperlipidemia LDL goal <70 10/26/2017  . Carotid stenosis, bilateral 10/13/2016  . Atrial tachycardia, paroxysmal ([place]) 01/15/2014  . Benign essential HTN 01/01/2014  . Persistent atrial fibrillation ([place]) 01/01/2014    Past Surgical History:  Procedure Laterality Date  . multiple ankle surgeries       OB History   None      Home Medications    Prior to Admission medications   Medication Sig Start Date End Date Taking? Authorizing Provider  acetaminophen (TYLENOL) 325 MG tablet Take 650 mg by mouth every 6 (six) hours as needed for mild pain.    [provider]  ALPRAZolam Duanne Moron) 0.5 MG tablet Take 0.5 mg by mouth at bedtime as needed for anxiety. USES FOR SLEEP  [provider]  apixaban (ELIQUIS) 5 MG TABS tablet Take 1 tablet (5 mg total) by mouth 2 (two) times daily. 10/26/17   Sueanne Margarita, MD  calcium citrate-vitamin D (CITRACAL+D) 315-200 MG-UNIT per tablet Take 1 tablet by mouth daily.    [provider]  cycloSPORINE (RESTASIS) 0.05 % ophthalmic emulsion Place 1 drop into both eyes daily.     [provider]  omeprazole (PRILOSEC OTC) 20 MG tablet Take 20 mg by mouth as needed.    [provider]  ranitidine (ZANTAC) 150 MG tablet Take 150 mg by mouth 2 (two) times daily as needed.     [provider]     Family History Family History  Problem Relation Age of Onset  . Parkinson's disease Mother   . Heart attack Father     Social History Social History   Tobacco Use  . Smoking status: Never Smoker  . Smokeless tobacco: Never Used  Substance Use Topics  . Alcohol use: Yes    Comment: 1-2 glasses of wine weekly  . Drug use: No     Allergies   Erythromycin   Review of Systems Review of Systems  Cardiovascular: Positive for chest pain and palpitations.  All other systems reviewed and are negative.    Physical Exam Updated Vital Signs BP 125/82   Pulse 64   Temp 98 F (36.7 C) (Oral)   Resp 15   SpO2 96%   Physical Exam  Constitutional: She is oriented to person, place, and time. She appears well-developed and well-nourished. No distress.  HENT:  Head: Normocephalic and atraumatic.  Mouth/Throat: Oropharynx is clear and moist.  Eyes: Pupils are equal, round, and reactive to light. Conjunctivae and EOM are normal.  Neck: Normal range of motion. Neck supple.  Cardiovascular: Normal heart sounds.  Tachycardic  Irregularly irregular rhythm   Pulmonary/Chest: Effort normal and breath sounds normal. No respiratory distress.  Abdominal: Soft. She exhibits no distension. There is no tenderness.  Musculoskeletal: Normal range of motion. She exhibits no edema or deformity.  Neurological: She is alert and oriented to person, place, and time.  Skin: Skin is warm and dry.  Psychiatric: She has a normal mood and affect.  Nursing note and vitals reviewed.    ED Treatments / Results  Labs (all labs ordered are listed, but only abnormal results are displayed) Labs Reviewed  BASIC METABOLIC PANEL  CBC  I-STAT TROPONIN, ED    EKG EKG Interpretation  Date/Time:  Tuesday March 20 2018 09:03:13 EDT Ventricular Rate:  148 PR Interval:    QRS Duration: 82 QT Interval:  270 QTC Calculation: 423 R Axis:   58 Text Interpretation:  Atrial fibrillation with rapid  ventricular response Nonspecific ST and T wave abnormality Abnormal ECG Confirmed by Dene Gentry [phone]) on 03/20/2018 9:16:00 AM   Radiology No results found.  Procedures Procedures (including critical care time)  Medications Ordered in ED Medications  diltiazem (CARDIZEM) injection 10 mg (10 mg Intravenous Given 03/20/18 0936)     Initial Impression / Assessment and Plan / ED Course  I have reviewed the triage vital signs and the nursing notes.  Pertinent labs & imaging results that were available during my care of the patient were reviewed by me and considered in my medical decision making (see chart for details).     MDM  Screen complete  Patient is presenting for evaluation of episode of atrial fibrillation.  Patient given Cardizem for rate control which resulted in  spontaneous conversion back to normal sinus rhythm.  Patient without significant abnormality on screening labs.  She is comfortable and in a normal sinus rhythm at time of discharge.    She understands need for close follow-up.  Strict return precautions are given and understood.  Final Clinical Impressions(s) / ED Diagnoses   Final diagnoses:  Atrial fibrillation with rapid ventricular response [facility])    ED Discharge Orders    None       Valarie Merino, MD 03/20/18 [phone]

## 2018-03-22 ENCOUNTER — Encounter (HOSPITAL_COMMUNITY): Payer: Self-pay | Admitting: Nurse Practitioner

## 2018-03-22 ENCOUNTER — Ambulatory Visit (HOSPITAL_COMMUNITY)
Admission: RE | Admit: 2018-03-22 | Discharge: 2018-03-22 | Disposition: A | Discharge status: Home / Self Care | Payer: Medicare Other | Source: Ambulatory Visit | Attending: Nurse Practitioner | Admitting: Nurse Practitioner

## 2018-03-22 VITALS — BP 128/82 | HR 72 | Ht 66.0 in | Wt 191.0 lb

## 2018-03-22 DIAGNOSIS — I480 Paroxysmal atrial fibrillation: Secondary | ICD-10-CM | POA: Diagnosis present

## 2018-03-22 DIAGNOSIS — Z7901 Long term (current) use of anticoagulants: Secondary | ICD-10-CM | POA: Insufficient documentation

## 2018-03-22 DIAGNOSIS — Z881 Allergy status to other antibiotic agents status: Secondary | ICD-10-CM | POA: Insufficient documentation

## 2018-03-22 DIAGNOSIS — E785 Hyperlipidemia, unspecified: Secondary | ICD-10-CM | POA: Diagnosis not present

## 2018-03-22 DIAGNOSIS — I10 Essential (primary) hypertension: Secondary | ICD-10-CM | POA: Diagnosis not present

## 2018-03-22 DIAGNOSIS — G4733 Obstructive sleep apnea (adult) (pediatric): Secondary | ICD-10-CM | POA: Insufficient documentation

## 2018-03-22 DIAGNOSIS — Z79899 Other long term (current) drug therapy: Secondary | ICD-10-CM | POA: Insufficient documentation

## 2018-03-22 NOTE — Progress Notes (Signed)
Primary Care Physician: Lavone Orn, MD Referring Physician: [facility] triage office Cardiologist: Dr. Radford Pax EP: Dr. Vicente Masson Spiker is a 77 y.o. female with a h/o afib and remote afib ablation 12 years ago with Dr. Ola Spurr. She was referred from Dr. Theodosia Blender office in 2018, after monitor showed episodes of paroxysmal afib. Toprol XL 25 mg  was stopped in September 2018, with pt reporting some low BP readings. She has noted increase of afib since BB was stopped. She also had a stress test which showed a possible small area of ischemia and pt was started on Imdur 30 mg daily  She is on Eliquis 5 mg bid for a chadsvasc score of at least 4.   F/u in Afib clinic 05/12/17. She was started back on low dose BB, and was instructed to watch BP at home. On return visit, she states that the palpations have resolved on the BB. Her BP has remained stable with BB and Imdur. The H/a's have finally stopped after getting used to the Imdur.   F/u  in afib clinic 03/22/18, for 3 recent episodes of afib. The last one was 9/3 which was associated with chest pain, which was unusual for her. She converted with IV Cardizem in the ER. Cardiac findings were normal.She states that her afib had been quiet for a long time. She notices that her afib episodes usually occur in the early am around 6-7 am when she first wakes up. She tested positive for OSA back in 2018 and BiPap was recommended but pt has refused. She also has some c/o of intermittent low BP readings and intermittent lightheadedness while on her feet, not always associated with low BP. She has also refused antiarrythmic's in the past and repeat ablation when discussed with Dr. Rayann Heman in the past. She is in SR today.  Today, she denies symptoms of palpitations, chest pain, shortness of breath, orthopnea, PND, lower extremity edema, dizziness, presyncope, syncope, or neurologic sequela. The patient is tolerating medications without difficulties and is  otherwise without complaint today.   Past Medical History:  Diagnosis Date  . Adenomatous colon polyp    cecum, last colonoscopy 2006  . Atrial tachycardia ([place])    non sustatined with PVCs and PACs  . Bacterial meningitis    as a child  . Basilar migraine   . Carotid stenosis, bilateral 10/13/2016   1-39% bilateral by dopplers 10/2016  . Cystocele    MIld  . History of hemorrhoids   . Hx of chest pain    Atypical  . Hx of hematuria    IVP 1990 left kidney stone, cystoscopy negative  . Hyperlipidemia   . Hyperlipidemia LDL goal <70 10/26/2017  . Hypertension   . Nephrolithiasis   . OSA (obstructive sleep apnea) 10/26/2017   Severe with AHI 38/hr.  Patient refuses PAP therapy  . Osteoporosis    actonel since 2006  . PAF (paroxysmal atrial fibrillation) ([place])    radiofrequency ablation of afib [facility]  . Right facial numbness 5/12   question TIA versus migraine,cardiac monitoring neg for AFIB  . Seizures ([place]) 1980   7 years on Dilantin  . Snoring   . SOB (shortness of breath)    Chronic mild, negative cardiolite  . Tinea versicolor    Past Surgical History:  Procedure Laterality Date  . multiple ankle surgeries      Current Outpatient Medications  Medication Sig Dispense Refill  . acetaminophen (TYLENOL) 325 MG tablet Take 650  mg by mouth every 6 (six) hours as needed for mild pain.    Marland Kitchen ALPRAZolam (XANAX) 0.5 MG tablet Take 0.5 mg by mouth at bedtime as needed for anxiety. USES FOR SLEEP    . apixaban (ELIQUIS) 5 MG TABS tablet Take 1 tablet (5 mg total) by mouth 2 (two) times daily. 180 tablet 3  . calcium citrate-vitamin D (CITRACAL+D) 315-200 MG-UNIT per tablet Take 1 tablet by mouth daily.    . cycloSPORINE (RESTASIS) 0.05 % ophthalmic emulsion Place 1 drop into both eyes daily.     Marland Kitchen diltiazem (CARDIZEM) 30 MG tablet Take 30 mg by mouth every 4 (four) hours as needed (afib HR >100).    Marland Kitchen omeprazole (PRILOSEC OTC) 20 MG tablet Take 20 mg by mouth as needed.      . ranitidine (ZANTAC) 150 MG tablet Take 150 mg by mouth 2 (two) times daily as needed.     . isosorbide mononitrate (IMDUR) 30 MG 24 hr tablet Take 30 mg by mouth daily.    . metoprolol succinate (TOPROL-XL) 25 MG 24 hr tablet Take 12.5 mg by mouth daily.     No current facility-administered medications for this encounter.     Allergies  Allergen Reactions  . Erythromycin Other (See Comments)    Yeast infection    Social History   Socioeconomic History  . Marital status: Married    Spouse name: Not on file  . Number of children: Not on file  . Years of education: Not on file  . Highest education level: Not on file  Occupational History  . Not on file  Social Needs  . Financial resource strain: Not on file  . Food insecurity:    Worry: Not on file    Inability: Not on file  . Transportation needs:    Medical: Not on file    Non-medical: Not on file  Tobacco Use  . Smoking status: Never Smoker  . Smokeless tobacco: Never Used  Substance and Sexual Activity  . Alcohol use: Yes    Comment: 1-2 glasses of wine weekly  . Drug use: No  . Sexual activity: Not on file  Lifestyle  . Physical activity:    Days per week: Not on file    Minutes per session: Not on file  . Stress: Not on file  Relationships  . Social connections:    Talks on phone: Not on file    Gets together: Not on file    Attends religious service: Not on file    Active member of club or organization: Not on file    Attends meetings of clubs or organizations: Not on file    Relationship status: Not on file  . Intimate partner violence:    Fear of current or ex partner: Not on file    Emotionally abused: Not on file    Physically abused: Not on file    Forced sexual activity: Not on file  Other Topics Concern  . Not on file  Social History Narrative  . Not on file    Family History  Problem Relation Age of Onset  . Parkinson's disease Mother   . Heart attack Father     ROS- All systems are  reviewed and negative except as per the HPI above  Physical Exam: Vitals:   03/22/18 1129  BP: 128/82  Pulse: 72  Weight: 86.6 kg  Height: 5\' 6"  (1.676 m)   Wt Readings from Last 3 Encounters:  03/22/18 86.6 kg  10/26/17 87.2 kg  08/02/17 88.5 kg    Labs: Lab Results  Component Value Date   NA 139 03/20/2018   K 4.8 03/20/2018   CL 109 03/20/2018   CO2 22 03/20/2018   GLUCOSE 99 03/20/2018   BUN 13 03/20/2018   CREATININE 0.76 03/20/2018   CALCIUM 9.2 03/20/2018   No results found for: INR No results found for: CHOL, HDL, LDLCALC, TRIG   GEN- The patient is well appearing, alert and oriented x 3 today.   Head- normocephalic, atraumatic Eyes-  Sclera clear, conjunctiva pink Ears- hearing intact Oropharynx- clear Neck- supple, no JVP Lymph- no cervical lymphadenopathy Lungs- Clear to ausculation bilaterally, normal work of breathing Heart- Regular rate and rhythm, no murmurs, rubs or gallops, PMI not laterally displaced GI- soft, NT, ND, + BS Extremities- no clubbing, cyanosis, or edema MS- no significant deformity or atrophy Skin- no rash or lesion Psych- euthymic mood, full affect Neuro- strength and sensation are intact  EKG-NSR, normal EKG at  72 bpm, Pr int 158 ms, qrs int 86 ms, qtc 422 ms Epic records reviewed     Assessment and Plan: 1. Paroxysmal afib  3 episodes over one month, usually over in 5-6 hours, usually present upon awakening Discussed antiarrythmic's/abaltion again and pt wants to hold off at this time We discussed that studies have shown that treatment of OSA can be as effective as AAD's or ablation She would like me to let Dr. Theodosia Blender office know that she is ready to treat sleep apnea Continue eliquis for chadsvasc score of at least 4  2. HTN States that she will have an occasional low BP  Encouraged days that BP is low drink extra water and can be more lenient with salt   F/u with cardiology 10/15  as scheduled afib clinic as  needed  [place] [place], [facility] [address] [place], [place] [address] [phone]

## 2018-03-26 ENCOUNTER — Telehealth: Payer: Self-pay | Admitting: *Deleted

## 2018-03-26 NOTE — Telephone Encounter (Signed)
-----   Message from Freada Bergeron, [place] sent at 03/23/2018  4:26 PM EDT ----- Regarding: pre cert recommends a BiPAP titration with S/T if needed.

## 2018-03-26 NOTE — Telephone Encounter (Signed)
Staff message sent to Gae Bon per [facility] web portal no PA is required. Ok to schedule BIPAP titration study.

## 2018-03-29 ENCOUNTER — Encounter: Payer: Self-pay | Admitting: *Deleted

## 2018-03-29 ENCOUNTER — Telehealth: Payer: Self-pay | Admitting: *Deleted

## 2018-03-29 NOTE — Addendum Note (Signed)
Addended by: Freada Bergeron on: 03/29/2018 02:11 PM   Modules accepted: Orders

## 2018-03-29 NOTE — Addendum Note (Signed)
Addended by: Freada Bergeron on: 03/29/2018 12:25 PM   Modules accepted: Orders

## 2018-03-29 NOTE — Telephone Encounter (Signed)
Patient is scheduled for BiPAP Titration w/ST on 04/30/18. Patient understands her titration study will be done at [facility] sleep lab. Patient understands she will receive a letter in a week or so detailing appointment, date, time, and location. Patient understands to call if he does not receive the letter  in a timely manner. Patient agrees with treatment and thanked me for call.

## 2018-03-29 NOTE — Telephone Encounter (Signed)
-----   Message from Lauralee Evener, [place] sent at 03/26/2018 10:21 AM EDT ----- Regarding: RE: pre cert Per [employer] web portal no PA is required. Ok to schedule BIPAP titration. [employer] decision ID [id number]. ----- Message ----- From: Freada Bergeron, CMA Sent: 03/23/2018   4:26 PM EDT To: [facility] Sleep Studies Subject: pre cert                                       recommends a BiPAP titration with S/T if needed.

## 2018-04-10 ENCOUNTER — Other Ambulatory Visit: Payer: Self-pay | Admitting: Cardiology

## 2018-04-30 ENCOUNTER — Encounter (HOSPITAL_BASED_OUTPATIENT_CLINIC_OR_DEPARTMENT_OTHER): Payer: Medicare Other

## 2018-04-30 ENCOUNTER — Ambulatory Visit (HOSPITAL_BASED_OUTPATIENT_CLINIC_OR_DEPARTMENT_OTHER): Payer: Medicare Other | Attending: Cardiology | Admitting: Cardiology

## 2018-04-30 VITALS — Ht 66.0 in | Wt 190.0 lb

## 2018-04-30 DIAGNOSIS — G4733 Obstructive sleep apnea (adult) (pediatric): Secondary | ICD-10-CM | POA: Diagnosis present

## 2018-04-30 NOTE — Progress Notes (Signed)
Cardiology Office Note    Date:  05/01/2018   ID:  [name], DOB [birth year], MRN [id number]  PCP:  Lavone Orn, MD  Cardiologist: Fransico Him, MD EPS:  Dr. Rayann Heman, but Dr. Ola Spurr did her first ablation and she wants referred back  Chief Complaint  Patient presents with  . Follow-up    History of Present Illness:  Rhonda [name] is a 77 y.o. female with history of persistent atrial fibrillation status post A. fib ablation 12 years ago, bilateral carotid stenosis less than 50%, PVCs, PACs and nonsustained atrial tachycardia.  She has been followed by Dr. Rayann Heman in the A. fib clinic for recurrent intermittent episodes of atrial fibrillation.  Patient did not want to have another ablation or antiarrhythmic.  CHA2DS2-VASc equals 4 on Eliquis.  She was found to have severe sleep apnea and BiPAP was recommended and titration scheduled for 04/30/2018.  Also has hypertension and HLD.  She stopped her statin because of leg weakness and last LDL was 126 07/2017.  Patient seen in the A. fib clinic 03/22/2018 because of 3 recent episodes of A. fib.  She converted with IV Cardizem in the ER.  She did not want to consider ablation or antiarrhythmics but did agree for BiPAP titration.  Patient comes in today for f/u. Had Afib over 150/m for 22 hrs on 04/04/18. Took Cardizem 30 mg twice and it corrected itself.Toprol and imdur stopped b/c of low BP. Gets dizzy when she stands up. Drinks 16 oz water/day, 2 cups of coffee daily. Trouble with Bipap titration yest at [facility]. She got tangled in the wires and ended up sleeping on her back which she normally doesn't do. Won't use bipap at current settings.  Past Medical History:  Diagnosis Date  . Adenomatous colon polyp    cecum, last colonoscopy 2006  . Atrial tachycardia ([place])    non sustatined with PVCs and PACs  . Bacterial meningitis    as a child  . Basilar migraine   . Carotid stenosis, bilateral 10/13/2016   1-39% bilateral by dopplers  10/2016  . Cystocele    MIld  . History of hemorrhoids   . Hx of chest pain    Atypical  . Hx of hematuria    IVP 1990 left kidney stone, cystoscopy negative  . Hyperlipidemia   . Hyperlipidemia LDL goal <70 10/26/2017  . Hypertension   . Nephrolithiasis   . OSA (obstructive sleep apnea) 10/26/2017   Severe with AHI 38/hr.  Patient refuses PAP therapy  . Osteoporosis    actonel since 2006  . PAF (paroxysmal atrial fibrillation) ([place])    radiofrequency ablation of afib [facility]  . Right facial numbness 5/12   question TIA versus migraine,cardiac monitoring neg for AFIB  . Seizures ([place]) 1980   7 years on Dilantin  . Snoring   . SOB (shortness of breath)    Chronic mild, negative cardiolite  . Tinea versicolor     Past Surgical History:  Procedure Laterality Date  . multiple ankle surgeries      Current Medications: Current Meds  Medication Sig  . acetaminophen (TYLENOL) 325 MG tablet Take 650 mg by mouth every 6 (six) hours as needed for mild pain.  Marland Kitchen ALPRAZolam (XANAX) 0.5 MG tablet Take 0.5 mg by mouth at bedtime as needed for anxiety. USES FOR SLEEP  . calcium citrate-vitamin D (CITRACAL+D) 315-200 MG-UNIT per tablet Take 1 tablet by mouth daily.  . cycloSPORINE (RESTASIS) 0.05 % ophthalmic emulsion  Place 1 drop into both eyes daily.   Marland Kitchen diltiazem (CARDIZEM) 30 MG tablet Take 30 mg by mouth every 4 (four) hours as needed (afib HR >100).  Marland Kitchen ELIQUIS 5 MG TABS tablet TAKE 1 TABLET BY MOUTH TWO  TIMES DAILY  . omeprazole (PRILOSEC OTC) 20 MG tablet Take 20 mg by mouth as needed.  . ranitidine (ZANTAC) 150 MG tablet Take 150 mg by mouth 2 (two) times daily as needed.      Allergies:   Erythromycin   Social History   Socioeconomic History  . Marital status: Married    Spouse name: Not on file  . Number of children: Not on file  . Years of education: Not on file  . Highest education level: Not on file  Occupational History  . Not on file  Social Needs  .  Financial resource strain: Not on file  . Food insecurity:    Worry: Not on file    Inability: Not on file  . Transportation needs:    Medical: Not on file    Non-medical: Not on file  Tobacco Use  . Smoking status: Never Smoker  . Smokeless tobacco: Never Used  Substance and Sexual Activity  . Alcohol use: Yes    Comment: 1-2 glasses of wine weekly  . Drug use: No  . Sexual activity: Not on file  Lifestyle  . Physical activity:    Days per week: Not on file    Minutes per session: Not on file  . Stress: Not on file  Relationships  . Social connections:    Talks on phone: Not on file    Gets together: Not on file    Attends religious service: Not on file    Active member of club or organization: Not on file    Attends meetings of clubs or organizations: Not on file    Relationship status: Not on file  Other Topics Concern  . Not on file  Social History Narrative  . Not on file     Family History:  The patient's family history includes Heart attack in her father; Parkinson's disease in her mother.   ROS:   Please see the history of present illness.    Review of Systems  Constitution: Negative.  HENT: Negative.   Eyes: Negative.   Cardiovascular: Positive for irregular heartbeat and palpitations.  Respiratory: Positive for sleep disturbances due to breathing and snoring.   Hematologic/Lymphatic: Negative.   Musculoskeletal: Negative.  Negative for joint pain.  Gastrointestinal: Negative.   Genitourinary: Negative.   Neurological: Positive for dizziness.   All other systems reviewed and are negative.   PHYSICAL EXAM:   VS:  BP 110/70   Pulse 82   Ht 5\' 6"  (1.676 m)   Wt 193 lb 3.2 oz (87.6 kg)   SpO2 94%   BMI 31.18 kg/m   Physical Exam  GEN: Well nourished, well developed, in no acute distress  Neck: no JVD, carotid bruits, or masses Cardiac:RRR; no murmurs, rubs, or gallops  Respiratory:  clear to auscultation bilaterally, normal work of breathing GI:  soft, nontender, nondistended, + BS Ext: without cyanosis, clubbing, or edema, Good distal pulses bilaterally Neuro:  Alert and Oriented x 3 Psych: euthymic mood, full affect  Wt Readings from Last 3 Encounters:  05/01/18 193 lb 3.2 oz (87.6 kg)  04/30/18 190 lb (86.2 kg)  03/22/18 191 lb (86.6 kg)      Studies/Labs Reviewed:   EKG:  EKG is not ordered  today.    Recent Labs: 03/20/2018: BUN 13; Creatinine, Ser 0.76; Hemoglobin 13.7; Platelets 174; Potassium 4.8; Sodium 139   Lipid Panel No results found for: CHOL, TRIG, HDL, CHOLHDL, VLDL, LDLCALC, LDLDIRECT  Additional studies/ records that were reviewed today include:  2D echo 12/2015  study Conclusions   - Left ventricle: The cavity size was normal. Wall thickness was   normal. Systolic function was normal. The estimated ejection   fraction was in the range of 55% to 60%. Wall motion was normal;   there were no regional wall motion abnormalities. Doppler   parameters are consistent with abnormal left ventricular   relaxation (grade 1 diastolic dysfunction). The E/e&' ratio is   between 8-15, suggesting indeterminate LV filling pressure. - Aortic valve: Trileaflet. Sclerosis without stenosis. There was   trivial regurgitation. - Mitral valve: Mildly thickened leaflets . There was trivial   regurgitation. - Left atrium: The atrium was normal in size. - Tricuspid valve: There was mild regurgitation. - Pulmonary arteries: PA peak pressure: 30 mm Hg (S). - Inferior vena cava: The vessel was normal in size. The   respirophasic diameter changes were in the normal range (= 50%),   consistent with normal central venous pressure.   Impressions:   - LVEF 55-60%, normal wall thickness, normal wall motion, diastolic   dysfunction, indeterminate LV filling pressure, aortic valve   sclerosis with trivial AI, normal LA size, mild TR, RVSP 30 mmHg,   normal IVC.    Nuclear stress test 10/2018Study Highlights    Nuclear stress EF:  68%.  There was no ST segment deviation noted during stress.  There is a small defect of mild severity present in the basal inferolateral and mid inferolateral location. The defect is reversible. This likely is due to variations in diaphragmatic attenuation artifact but cannot rule out a very small area of ischemia  This is a low risk study.  The left ventricular ejection fraction is hyperdynamic (>65%).      Carotid Dopplers 10/2016 1 to 39% bilateral stenosis needs repeat 10/2018  ASSESSMENT:    1. Persistent atrial fibrillation   2. Benign essential HTN   3. Orthostatic hypotension   4. OSA (obstructive sleep apnea)   5. Hyperlipidemia LDL goal <70   6. Carotid stenosis, bilateral      PLAN:  In order of problems listed above:  PAF status post ablation but recurrence of Afib. Recent episode lasting 22 hrs with HR > 150.   Patient had bipap titration last night for sleep apnea but she won't use..  She is asking for referral to Dr. Adrian Prows at [facility] who did her first ablation.  We will make that referral.  Blood pressure is too low to use long-acting beta-blocker or calcium channel blocker.  Continue short acting calcium channel blocker as needed.  Told her she needs to go to emergency room for recurrent atrial fibrillation lasting more than 3 or 4 hours.  She is on Eliquis.  Essential hypertension. Is orthostatic in the office today so unable to tolerate long acting toprol or cardizem.  Is not staying hydrated.  Encourage hydration and increase water intake to 64 oz/day.Will also check CBC, bmet & TSH.  OSA status post BiPAP titration last night but patient says she will not use it as its been prescribed because it stews forceful.  Follow-up with Dr. Radford Pax for further recommendations.  Hyperlipidemia PCP stopped her statin and HDL was 69  Carotid stenosis 1 to 39%  10/2016.  Needs repeat 10/2018    Medication Adjustments/Labs and Tests Ordered: Current  medicines are reviewed at length with the patient today.  Concerns regarding medicines are outlined above.  Medication changes, Labs and Tests ordered today are listed in the Patient Instructions below. Patient Instructions  Medication Instructions:  Your physician recommends that you continue on your current medications as directed. Please refer to the Current Medication list given to you today.  If you need a refill on your cardiac medications before your next appointment, please call your pharmacy.   Lab work: TODAY:  BMET, CBC, & TSH   If you have labs (blood work) drawn today and your tests are completely normal, you will receive your results only by: Marland Kitchen MyChart Message (if you have MyChart) OR . A paper copy in the mail If you have any lab test that is abnormal or we need to change your treatment, we will call you to review the results.  Testing/Procedures: None ordered   You have been referred to DR. DAVID FITZGERALD, ELECTROPHYSIOLOGIST.    Follow-Up: At [facility], you and your health needs are our priority.  As part of our continuing mission to provide you with exceptional heart care, we have created designated Provider Care Teams.  These Care Teams include your primary Cardiologist (physician) and Advanced Practice Providers (APPs -  Physician Assistants and Nurse Practitioners) who all work together to provide you with the care you need, when you need it. . You will need a follow up appointment in 2 months.  Please call our office 2 months in advance to schedule this appointment.  You may see Fransico Him, MD FOR YOUR SLEEP APNEA  Any Other Special Instructions Will Be Listed Below (If Applicable).  -- I RECOMMEND YOU WEAR COMPRESSION STOCKINGS TO HELP YOU WITH THE ORTHOSTATICS -- I RECOMMEND YOU DRINK AT LEAST 64 OZ OF WATER DAILY     Signed, [name], PA-C  05/01/2018 11:11 AM    [facility] [place], [place], [place]   [address] Phone: [phone]; Fax: [phone]

## 2018-05-01 ENCOUNTER — Encounter: Payer: Self-pay | Admitting: Physician Assistant

## 2018-05-01 ENCOUNTER — Ambulatory Visit: Payer: Medicare Other | Admitting: Physician Assistant

## 2018-05-01 VITALS — BP 110/70 | HR 82 | Ht 66.0 in | Wt 193.2 lb

## 2018-05-01 DIAGNOSIS — I4819 Other persistent atrial fibrillation: Secondary | ICD-10-CM | POA: Diagnosis not present

## 2018-05-01 DIAGNOSIS — E785 Hyperlipidemia, unspecified: Secondary | ICD-10-CM

## 2018-05-01 DIAGNOSIS — I10 Essential (primary) hypertension: Secondary | ICD-10-CM

## 2018-05-01 DIAGNOSIS — I951 Orthostatic hypotension: Secondary | ICD-10-CM

## 2018-05-01 DIAGNOSIS — G4733 Obstructive sleep apnea (adult) (pediatric): Secondary | ICD-10-CM | POA: Diagnosis not present

## 2018-05-01 DIAGNOSIS — I6523 Occlusion and stenosis of bilateral carotid arteries: Secondary | ICD-10-CM

## 2018-05-01 NOTE — Patient Instructions (Signed)
Medication Instructions:  Your physician recommends that you continue on your current medications as directed. Please refer to the Current Medication list given to you today.  If you need a refill on your cardiac medications before your next appointment, please call your pharmacy.   Lab work: TODAY:  BMET, CBC, & TSH   If you have labs (blood work) drawn today and your tests are completely normal, you will receive your results only by: Marland Kitchen MyChart Message (if you have MyChart) OR . A paper copy in the mail If you have any lab test that is abnormal or we need to change your treatment, we will call you to review the results.  Testing/Procedures: None ordered   You have been referred to DR. DAVID FITZGERALD, ELECTROPHYSIOLOGIST.    Follow-Up: At [facility], you and your health needs are our priority.  As part of our continuing mission to provide you with exceptional heart care, we have created designated Provider Care Teams.  These Care Teams include your primary Cardiologist (physician) and Advanced Practice Providers (APPs -  Physician Assistants and Nurse Practitioners) who all work together to provide you with the care you need, when you need it. . You will need a follow up appointment in 2 months.  Please call our office 2 months in advance to schedule this appointment.  You may see Fransico Him, MD FOR YOUR SLEEP APNEA  Any Other Special Instructions Will Be Listed Below (If Applicable).  -- I RECOMMEND YOU WEAR COMPRESSION STOCKINGS TO HELP YOU WITH THE ORTHOSTATICS -- I RECOMMEND YOU DRINK AT LEAST 64 OZ OF WATER DAILY

## 2018-05-02 ENCOUNTER — Encounter (HOSPITAL_BASED_OUTPATIENT_CLINIC_OR_DEPARTMENT_OTHER): Payer: Medicare Other

## 2018-05-02 LAB — CBC
HEMATOCRIT: 37.4 % (ref 34.0–46.6)
Hemoglobin: 12.2 g/dL (ref 11.1–15.9)
MCH: 28.9 pg (ref 26.6–33.0)
MCHC: 32.6 g/dL (ref 31.5–35.7)
MCV: 89 fL (ref 79–97)
Platelets: 227 10*3/uL (ref 150–450)
RBC: 4.22 x10E6/uL (ref 3.77–5.28)
RDW: 12.8 % (ref 12.3–15.4)
WBC: 5.2 10*3/uL (ref 3.4–10.8)

## 2018-05-02 LAB — TSH: TSH: 4.25 u[IU]/mL (ref 0.450–4.500)

## 2018-05-02 LAB — BASIC METABOLIC PANEL
BUN/Creatinine Ratio: 20 (ref 12–28)
BUN: 15 mg/dL (ref 8–27)
CALCIUM: 9.5 mg/dL (ref 8.7–10.3)
CO2: 22 mmol/L (ref 20–29)
Chloride: 105 mmol/L (ref 96–106)
Creatinine, Ser: 0.74 mg/dL (ref 0.57–1.00)
GFR, EST AFRICAN AMERICAN: 91 mL/min/{1.73_m2} (ref >59)
GFR, EST NON AFRICAN AMERICAN: 79 mL/min/{1.73_m2} (ref >59)
Glucose: 113 mg/dL — ABNORMAL HIGH (ref 65–99)
Potassium: 4.3 mmol/L (ref 3.5–5.2)
Sodium: 142 mmol/L (ref 134–144)

## 2018-05-02 NOTE — Procedures (Signed)
   Patient Name: Rhonda [name], Rhonda [name] Date: 04/30/2018 Gender: Female D.O.B: 26-Jun-1941 Age (years): 69 Referring Provider: Fransico Him MD, ABSM Height (inches): 66 Interpreting Physician: Fransico Him MD, ABSM Weight (lbs): 190 RPSGT: Zadie Rhine BMI: 31 MRN: [id number] Neck Size: 15.00  CLINICAL INFORMATION  The patient is referred for a BiPAP titration to treat sleep apnea  SLEEP STUDY TECHNIQUE  As per the AASM Manual for the Scoring of Sleep and Associated Events v2.3 (April 2016) with a hypopnea requiring 4% desaturations. The channels recorded and monitored were frontal, central and occipital EEG, electrooculogram (EOG), submentalis EMG (chin), nasal and oral airflow, thoracic and abdominal wall motion, anterior tibialis EMG, snore microphone, electrocardiogram, and pulse oximetry. Bilevel positive airway pressure (BPAP) was initiated at the beginning of the study and titrated to treat sleep-disordered breathing.  MEDICATIONS  Medications self-administered by patient taken the night of the study : TOPROL XL, ELIQUIS, TYLENOL, TYLENOL PM  RESPIRATORY PARAMETERS  Optimal IPAP Pressure (cm):12 AHI at Optimal Pressure (/hr)0.0 Optimal EPAP Pressure (cm):8 Overall Minimal O2 (%):89.0 Minimal O2 at Optimal Pressure (%):95.0  SLEEP ARCHITECTURE  Start Time:10:08:49 PM Stop Time:4:35:04 AM Total Time (min):386.2 Total Sleep Time (min):227.5 Sleep Latency (min):62.7 Sleep Efficiency (%):58.9% REM Latency (min):263.0 WASO (min):96.0 Stage N1 (%):7.9% Stage N2 (%):68.8% Stage N3 (%):0.0% Stage R (%):23.3 Supine (%):47.69 Arousal Index (/hr):15.6  CARDIAC DATA  The 2 lead EKG demonstrated sinus rhythm. The mean heart rate was 53.2 beats per minute. Other EKG findings include: None.  LEG MOVEMENT DATA  The total Periodic Limb Movements of Sleep (PLMS) were 0. The PLMS index was 0.0. A PLMS index of <15 is considered normal in adults.  IMPRESSIONS  - An optimal PAP  pressure was selected for this patient ( 12/8 cm of water) - Central sleep apnea was not noted during this titration (CAI = 1.6/h). - Mild oxygen desaturations were observed during this titration (min O2 = 89.0%). - No snoring was audible during this study. - No cardiac abnormalities were observed during this study. - Clinically significant periodic limb movements were not noted during this study. Arousals associated with PLMs were rare.  DIAGNOSIS  - Obstructive Sleep Apnea (327.23 [G47.33 ICD-10])  RECOMMENDATIONS  - Trial of BiPAP therapy on 12/8 cm H2O with a Small size Resmed Full Face Mask Quattro FX for Her mask and heated humidification. - Avoid alcohol, sedatives and other CNS depressants that may worsen sleep apnea and disrupt normal sleep architecture. - Sleep hygiene should be reviewed to assess factors that may improve sleep quality. - Weight management and regular exercise should be initiated or continued. - Return to [facility] Center for re-evaluation after 10 weeks of therapy  [Electronically signed] 05/02/2018 12:48 PM  Fransico Him MD, ABSM Diplomate, American Board of Sleep Medicine

## 2018-05-08 NOTE — Telephone Encounter (Signed)
Informed patient of sleep study results and patient understanding was verbalized. Patient understands her sleep study showed they had a successful PAP titration and let orders are in Epic. Upon patient request DME selection is [facility]. Patient understands she will be contacted by [facility] to set up her cpap. Patient understands to call if [facility] does not contact her with new setup in a timely manner. Patient understands they will be called once confirmation has been received from [facility] that they have received their new machine to schedule 10 week follow up appointment.  [facility] notified of new cpap order  Please add to airview Patient was grateful for the call and thanked me.

## 2018-06-18 ENCOUNTER — Ambulatory Visit: Payer: Medicare Other | Admitting: Cardiology

## 2018-06-21 ENCOUNTER — Ambulatory Visit: Payer: Medicare Other | Admitting: Cardiology

## 2018-08-14 ENCOUNTER — Telehealth: Payer: Self-pay | Admitting: *Deleted

## 2018-08-14 NOTE — Telephone Encounter (Signed)
Reached out to patient after talking to her on previous occasions about recieving her Bipap unit. Patient is very upset that she still has not received her unit and states she is not going to take another sleep study. It has been documented several times that the patient has stated she did not want the unit.  I sent her entire referral over to [facility] on 05/08/18. Patient questioned if I documented when I sent her referral over to the medical supply company and I replied all documentation is time stamp automatically in Epic. I reached out to [facility] Ivin Booty) and she states the process has timed out to order patients Bipap unit and she will have to start over. I spoke to the patient and informed her the process had timed out and she was very upset and blamed me saying I did not stay up on top of the matter and that she will speak to Dr Radford Pax about this at her next appointment.

## 2018-09-05 ENCOUNTER — Other Ambulatory Visit: Payer: Self-pay

## 2018-09-05 ENCOUNTER — Ambulatory Visit: Payer: Medicare Other | Attending: Internal Medicine | Admitting: Physical Therapy

## 2018-09-05 ENCOUNTER — Encounter: Payer: Self-pay | Admitting: Physical Therapy

## 2018-09-05 DIAGNOSIS — R2681 Unsteadiness on feet: Secondary | ICD-10-CM

## 2018-09-05 DIAGNOSIS — R42 Dizziness and giddiness: Secondary | ICD-10-CM | POA: Insufficient documentation

## 2018-09-05 DIAGNOSIS — R262 Difficulty in walking, not elsewhere classified: Secondary | ICD-10-CM

## 2018-09-05 DIAGNOSIS — M6281 Muscle weakness (generalized): Secondary | ICD-10-CM | POA: Insufficient documentation

## 2018-09-05 NOTE — Therapy (Signed)
[facility] [address] [place], [place], [address] Phone: [phone]   Fax:  [phone]  Physical Therapy Evaluation  Patient Details  Name: Rhonda [name] MRN: [id number] Date of Birth: 21-Oct-1940 Referring Provider (PT): Lavone Orn, MD   Encounter Date: 09/05/2018  PT End of Session - 09/05/18 1738    Visit Number  1    Number of Visits  9    Date for PT Re-Evaluation  11/04/18   due to delay in appointments; 4 week POC   Authorization Type  [facility] Medicare: $30 copay.  No VL  10th visit PN    PT Start Time  1417   front staff checked pt in late   PT Stop Time  1450    PT Time Calculation (min)  33 min    Activity Tolerance  Patient tolerated treatment well    Behavior During Therapy  WFL for tasks assessed/performed       Past Medical History:  Diagnosis Date  . Adenomatous colon polyp    cecum, last colonoscopy 2006  . Atrial tachycardia ([place])    non sustatined with PVCs and PACs  . Bacterial meningitis    as a child  . Basilar migraine   . Carotid stenosis, bilateral 10/13/2016   1-39% bilateral by dopplers 10/2016  . Cystocele    MIld  . History of hemorrhoids   . Hx of chest pain    Atypical  . Hx of hematuria    IVP 1990 left kidney stone, cystoscopy negative  . Hyperlipidemia   . Hyperlipidemia LDL goal <70 10/26/2017  . Hypertension   . Nephrolithiasis   . OSA (obstructive sleep apnea) 10/26/2017   Severe with AHI 38/hr.  Patient refuses PAP therapy  . Osteoporosis    actonel since 2006  . PAF (paroxysmal atrial fibrillation) ([place])    radiofrequency ablation of afib [facility]  . Right facial numbness 5/12   question TIA versus migraine,cardiac monitoring neg for AFIB  . Seizures ([place]) 1980   7 years on Dilantin  . Snoring   . SOB (shortness of breath)    Chronic mild, negative cardiolite  . Tinea versicolor     Past Surgical History:  Procedure Laterality Date  . multiple  ankle surgeries      There were no vitals filed for this visit.   Subjective Assessment - 09/05/18 1728    Subjective  Patient isn't sure that physical therapy will help her balance issues; she has double vision and is waiting on new prescription lenses with prisms, has a R fused ankle and her BP goes between being too high and too low causing lightheadedness.  No falls.    Pertinent History  tinea versicolor, diplopia with prisms in glasses, SOB, seizures in 1980, R facial numbness, atrial fibrillation, osteoporosis, HTN, hyperlipidemia, h/o chest pain, cystocele, carotid stenosis, bilaterally, basilar migraine, bacterial meningitis, atrial tachycardia, multiple ankle surgeries with a R fused ankle and nephrolithiasis    Limitations  Walking;Standing    Patient Stated Goals  To be  able to transition to balance classes and chair yoga at her community    Currently in Pain?  Yes    Pain Score  4     Pain Location  Hip    Pain Orientation  Left    Pain Descriptors / Indicators  Sore    Pain Type  Acute pain    Pain Onset  1 to 4 weeks ago  [facility] PT Assessment - 09/05/18 1426      Assessment   Medical Diagnosis  gait abnormality, imbalance    Referring Provider (PT)  Lavone Orn, MD    Onset Date/Surgical Date  08/24/18    Prior Therapy  unknown      Precautions   Precautions  Fall    Precaution Comments  tinea versicolor, diplopia with prisms in glasses, SOB, seizures in 1980, R facial numbness, atrial fibrillation, osteoporosis, HTN, hyperlipidemia, h/o chest pain, cystocele, carotid stenosis, bilaterally, basilar migraine, bacterial meningitis, atrial tachycardia, multiple ankle surgeries with a R fused ankle and nephrolithiasis      Balance Screen   Has the patient fallen in the past 6 months  No      [place] residence    Living Arrangements  Spouse/significant other    Type of [place] to enter     Entrance Stairs-Number of Steps  1 brick    Entrance Stairs-Rails  None    Home Layout  One level    [place] - single point    Additional Comments  uses cane at night when she wakes up and is stiff      Prior Function   Level of Independence  Independent    Leisure  balance exercises and chair yoga at clubhouse      Observation/Other Assessments   Focus on Therapeutic Outcomes (FOTO)   Not assessed      Sensation   Light Touch  Appears Intact      ROM / Strength   AROM / PROM / Strength  Strength      Strength   Overall Strength  Deficits    Overall Strength Comments  4-/5 hip flexion bilaterally, 4+/5 knee extension, 4+/5 knee flexion, 4-/5 ankle DF with limited ROM R ankle due to fusion      Ambulation/Gait   Ambulation/Gait  Yes    Ambulation/Gait Assistance  6: Modified independent (Device/Increase time)    Ambulation Distance (Feet)  115 Feet    Assistive device  None    Gait Pattern  Step-through pattern;Decreased step length - right;Decreased step length - left;Decreased stride length;Poor foot clearance - left;Poor foot clearance - right    Ambulation Surface  Level;Indoor      Standardized Balance Assessment   Standardized Balance Assessment  Berg Balance Test;Five Times Sit to Stand    Five times sit to stand comments   25 seconds from chair with use of UE; uncontrolled descent, R foot forwards      Berg Balance Test   Sit to Stand  Able to stand  independently using hands    Standing Unsupported  Able to stand safely 2 minutes    Sitting with Back Unsupported but Feet Supported on Floor or Stool  Able to sit safely and securely 2 minutes    Stand to Sit  Controls descent by using hands    Transfers  Able to transfer safely, minor use of hands    Standing Unsupported with Eyes Closed  Able to stand 10 seconds safely    Standing Ubsupported with Feet Together  Needs help to attain position but able to stand for 30 seconds with feet together    From  Standing, Reach Forward with Outstretched Arm  Can reach confidently >25 cm (10")    From Standing Position, Pick up Object from Floor  Able to pick up  shoe safely and easily    From Standing Position, Turn to Look Behind Over each Shoulder  Looks behind from both sides and weight shifts well    Turn 360 Degrees  Able to turn 360 degrees safely but slowly    Standing Unsupported, Alternately Place Feet on Step/Stool  Able to complete 4 steps without aid or supervision    Standing Unsupported, One Foot in Front  Able to take small step independently and hold 30 seconds    Standing on One Leg  Tries to lift leg/unable to hold 3 seconds but remains standing independently    Total Score  42    Berg comment:  42/56                Objective measurements completed on examination: See above findings.              PT Education - 09/05/18 1738    Education Details  clinical findings, PT POC and goals    Person(s) Educated  Patient    Methods  Explanation    Comprehension  Verbalized understanding          PT Long Term Goals - 09/05/18 1744      PT LONG TERM GOAL #1   Title  Patient will demonstrate independence with balance and strengthening HEP    Time  4    Period  Weeks    Status  New    Target Date  11/04/18      PT LONG TERM GOAL #2   Title  Pt will report attending group balance classes and chair yoga for maintaining wellness    Time  4    Period  Weeks    Status  New    Target Date  11/04/18      PT LONG TERM GOAL #3   Title  Pt will improve five time sit to stand to </= 20 seconds from arm chair    Baseline  25 seconds with use of UE on arm chair, wide BOS, R foot forwards    Time  4    Period  Weeks    Status  New    Target Date  11/04/18      PT LONG TERM GOAL #4   Title  Pt will demonstrate decreased falls risk as indicated by 8 point increase in BERG    Baseline  42/56    Time  4    Period  Weeks    Status  New    Target Date  11/04/18       PT LONG TERM GOAL #5   Title  Pt will improve gait velocity to >/= 2.62 ft/sec     Baseline  TBD    Time  4    Period  Weeks    Status  New    Target Date  11/04/18      Additional Long Term Goals   Additional Long Term Goals  Yes      PT LONG TERM GOAL #6   Title  Pt will safely negotiate one step/curb without UE support with supervision    Time  4    Period  Weeks    Status  New    Target Date  11/04/18      PT LONG TERM GOAL #7   Title  Vestibular goal if needed/Pt will verbalize and demonstrate techniques to decrease effects of orthostasis after prolonged sitting    Time  4  Period  Weeks    Status  New    Target Date  11/04/18             Plan - 09/05/18 1739    Clinical Impression Statement  Pt is a 78 year old female referred to Neuro OPPT for evaluation of gait abnormalities and imbalance.  Pt's PMH is significant for the following: tinea versicolor, diplopia with prisms in glasses, SOB, seizures in 1980, R facial numbness, atrial fibrillation, osteoporosis, HTN, hyperlipidemia, h/o chest pain, cystocele, carotid stenosis, bilaterally, basilar migraine, bacterial meningitis, atrial tachycardia, multiple ankle surgeries with a R fused ankle and nephrolithiasis. The following deficits were noted during pt's exam: impaired functional LE strength, decreased R ankle ROM, L hip pain (possible flare of bursitis), impaired dynamic standing balance and impaired gait.  Pt's five time sit to stand score and BERG score indicates pt is at significant risk for falls. Pt would benefit from skilled PT to address these impairments and functional limitations to maximize functional mobility independence and reduce falls risk.    History and Personal Factors relevant to plan of care:  wants to prevent falls, wants to transition to balance classes/chair yoga at her community.  PMH includes: tinea versicolor, diplopia with prisms in glasses, SOB, seizures in 1980, R facial numbness, atrial  fibrillation, osteoporosis, HTN, hyperlipidemia, h/o chest pain, cystocele, carotid stenosis, bilaterally, basilar migraine, bacterial meningitis, atrial tachycardia, multiple ankle surgeries with a R fused ankle and nephrolithiasis    Clinical Presentation  Stable    Clinical Presentation due to:  wants to prevent falls, wants to transition to balance classes/chair yoga at her community.  PMH includes: tinea versicolor, diplopia with prisms in glasses, SOB, seizures in 1980, R facial numbness, atrial fibrillation, osteoporosis, HTN, hyperlipidemia, h/o chest pain, cystocele, carotid stenosis, bilaterally, basilar migraine, bacterial meningitis, atrial tachycardia, multiple ankle surgeries with a R fused ankle and nephrolithiasis    Clinical Decision Making  Low    Rehab Potential  Good    PT Frequency  2x / week    PT Duration  4 weeks    PT Treatment/Interventions  ADLs/Self Care Home Management;[place]ith Repostioning;Cryotherapy;Moist Heat;Gait training;Stair training;Functional mobility training;Therapeutic activities;Therapeutic exercise;Balance training;Neuromuscular re-education;Patient/family education;Manual techniques;Passive range of motion;Dry needling;Vestibular    PT Next Visit Plan  Pt has fused R ankle - probably will not be able to gain more range.  assess orthostatics/vestibular if appropriate.  Assess gait velocity and reset goal baseline.  Initiate HEP for LE strengthening and balance.  Sit > stand training and strengthening.      PT Home Exercise Plan  wants to transition to chair yoga and group balance class at community clubhouse    Consulted and Agree with Plan of Care  Patient       Patient will benefit from skilled therapeutic intervention in order to improve the following deficits and impairments:  Decreased balance, Decreased range of motion, Decreased strength, Difficulty walking, Dizziness, Impaired vision/preception, Pain  Visit Diagnosis: Unsteadiness on  feet  Difficulty in walking, not elsewhere classified  Muscle weakness (generalized)  Dizziness and giddiness     Problem List Patient Active Problem List   Diagnosis Date Noted  . Orthostatic hypotension 05/01/2018  . OSA (obstructive sleep apnea) 10/26/2017  . Hyperlipidemia LDL goal <70 10/26/2017  . Carotid stenosis, bilateral 10/13/2016  . Atrial tachycardia, paroxysmal ([place]) 01/15/2014  . Benign essential HTN 01/01/2014  . Persistent atrial fibrillation 01/01/2014    Rico Junker, PT, DPT 09/05/18    5:51  PM    [facility] [address] [place], [place], [address] Phone: [phone]   Fax:  [phone]  Name: Rhonda [name] MRN: [id number] Date of Birth: [birth year]

## 2018-09-21 ENCOUNTER — Ambulatory Visit: Payer: Medicare Other | Attending: Internal Medicine | Admitting: Physical Therapy

## 2018-09-21 ENCOUNTER — Encounter: Payer: Self-pay | Admitting: Physical Therapy

## 2018-09-21 DIAGNOSIS — R262 Difficulty in walking, not elsewhere classified: Secondary | ICD-10-CM | POA: Diagnosis present

## 2018-09-21 DIAGNOSIS — R2681 Unsteadiness on feet: Secondary | ICD-10-CM | POA: Insufficient documentation

## 2018-09-21 DIAGNOSIS — R42 Dizziness and giddiness: Secondary | ICD-10-CM | POA: Diagnosis present

## 2018-09-21 DIAGNOSIS — M6281 Muscle weakness (generalized): Secondary | ICD-10-CM | POA: Insufficient documentation

## 2018-09-21 NOTE — Patient Instructions (Signed)
Access Code: [email or web address]  URL: [email or web address]  Date: 09/21/2018  Prepared by: Nita Sells   Exercises Seated March - 10 reps - 1 sets - 1x daily - 5x weekly Seated Long Arc Quad - 10 reps - 1 sets - 3 seconds hold - 1x daily - 5x weekly Seated Ankle Dorsiflexion AROM - 10 reps - 1 sets - 1x daily - 5x weekly Seated Heel Raise - 10 reps - 1 sets - 1x daily - 5x weekly Seated Ankle Pumps - 10 reps - 1 sets - 1x daily - 5x weekly Proper Sit to Stand Technique - 5 reps - 2 sets - 3-5x daily - 7x weekly

## 2018-09-21 NOTE — Therapy (Signed)
[facility] [address] [place], [place], [address] Phone: [phone]   Fax:  [phone]  Physical Therapy Treatment  Patient Details  Name: Rhonda [name] MRN: [id number] Date of Birth: Apr 05, 1941 Referring Provider (PT): Lavone Orn, MD   Encounter Date: 09/21/2018  PT End of Session - 09/21/18 1550    Visit Number  2    Number of Visits  9    Date for PT Re-Evaluation  11/04/18   due to delay in appointments; 4 week POC   Authorization Type  [facility] Medicare: $30 copay.  No VL  10th visit PN    PT Start Time  1019    PT Stop Time  1101    PT Time Calculation (min)  42 min    Activity Tolerance  Patient tolerated treatment well    Behavior During Therapy  WFL for tasks assessed/performed       Past Medical History:  Diagnosis Date  . Adenomatous colon polyp    cecum, last colonoscopy 2006  . Atrial tachycardia ([place])    non sustatined with PVCs and PACs  . Bacterial meningitis    as a child  . Basilar migraine   . Carotid stenosis, bilateral 10/13/2016   1-39% bilateral by dopplers 10/2016  . Cystocele    MIld  . History of hemorrhoids   . Hx of chest pain    Atypical  . Hx of hematuria    IVP 1990 left kidney stone, cystoscopy negative  . Hyperlipidemia   . Hyperlipidemia LDL goal <70 10/26/2017  . Hypertension   . Nephrolithiasis   . OSA (obstructive sleep apnea) 10/26/2017   Severe with AHI 38/hr.  Patient refuses PAP therapy  . Osteoporosis    actonel since 2006  . PAF (paroxysmal atrial fibrillation) ([place])    radiofrequency ablation of afib [facility]  . Right facial numbness 5/12   question TIA versus migraine,cardiac monitoring neg for AFIB  . Seizures ([place]) 1980   7 years on Dilantin  . Snoring   . SOB (shortness of breath)    Chronic mild, negative cardiolite  . Tinea versicolor     Past Surgical History:  Procedure Laterality Date  . multiple ankle surgeries      There were no  vitals filed for this visit.  Subjective Assessment - 09/21/18 1028    Subjective  Pt frustrated over "mix up with scheduling up front".  Had a pain in her L hip yesterday but resolved today.  Patient still hesitant/resistant to if therapy will help.  Pt states she does not like the word exercise and does not do much activity at home other than sitting and sewing.  Still has double vision and is waiting on new prescription lenses with prisms, has a R fused ankle and her BP goes between being too high and too low causing lightheadedness.  No falls.    Pertinent History  tinea versicolor, diplopia with prisms in glasses, SOB, seizures in 1980, R facial numbness, atrial fibrillation, osteoporosis, HTN, hyperlipidemia, h/o chest pain, cystocele, carotid stenosis, bilaterally, basilar migraine, bacterial meningitis, atrial tachycardia, multiple ankle surgeries with a R fused ankle and nephrolithiasis    Limitations  Walking;Standing    Patient Stated Goals  To be  able to transition to balance classes and chair yoga at her community    Currently in Pain?  No/denies    Pain Onset  1 to 4 weeks ago  [facility] Adult PT Treatment/Exercise - 09/21/18 0001      Ambulation/Gait   Ambulation/Gait  Yes    Ambulation/Gait Assistance  6: Modified independent (Device/Increase time)    Ambulation Distance (Feet)  --   for gait velocity   Assistive device  None    Gait Pattern  Step-through pattern;Decreased step length - right;Decreased step length - left;Decreased stride length;Poor foot clearance - left;Poor foot clearance - right    Ambulation Surface  Level;Indoor    Gait velocity  2.62 ft/sec=12.53 sec    Gait Comments  Pt gait cadence was increased with testing than it was walking around gym during activites      Treatment session focused on developing HEP.  See patient instructions for details of exercises performed during session.       PT Education - 09/21/18 1549     Education Details  HEP, importance of increasing activity    Person(s) Educated  Patient    Methods  Explanation;Demonstration;Verbal cues;Handout    Comprehension  Verbalized understanding;Other (comment)   needs continued reinforcement         PT Long Term Goals - 09/05/18 1744      PT LONG TERM GOAL #1   Title  Patient will demonstrate independence with balance and strengthening HEP    Time  4    Period  Weeks    Status  New    Target Date  11/04/18      PT LONG TERM GOAL #2   Title  Pt will report attending group balance classes and chair yoga for maintaining wellness    Time  4    Period  Weeks    Status  New    Target Date  11/04/18      PT LONG TERM GOAL #3   Title  Pt will improve five time sit to stand to </= 20 seconds from arm chair    Baseline  25 seconds with use of UE on arm chair, wide BOS, R foot forwards    Time  4    Period  Weeks    Status  New    Target Date  11/04/18      PT LONG TERM GOAL #4   Title  Pt will demonstrate decreased falls risk as indicated by 8 point increase in BERG    Baseline  42/56    Time  4    Period  Weeks    Status  New    Target Date  11/04/18      PT LONG TERM GOAL #5   Title  Pt will improve gait velocity to >/= 2.62 ft/sec     Baseline  TBD    Time  4    Period  Weeks    Status  New    Target Date  11/04/18      Additional Long Term Goals   Additional Long Term Goals  Yes      PT LONG TERM GOAL #6   Title  Pt will safely negotiate one step/curb without UE support with supervision    Time  4    Period  Weeks    Status  New    Target Date  11/04/18      PT LONG TERM GOAL #7   Title  Vestibular goal if needed/Pt will verbalize and demonstrate techniques to decrease effects of orthostasis after prolonged sitting    Time  4    Period  Weeks    Status  New    Target Date  11/04/18            Plan - 09/21/18 1546    Clinical Impression Statement  Pt needs encouragement to try activities as she feels  like her medical problems make certain activites impossible but did well once encouraged to perform during session.  Discussed importance of increasing activity at home for improved strengthening and balance.  Pt hesitant to perform any exercises in standing today as she says she cant stand for long.  No lob observed during session today.  Session focused on developing HEP.  Continue PT per POC.    Rehab Potential  Good    PT Frequency  2x / week    PT Duration  4 weeks    PT Treatment/Interventions  ADLs/Self Care Home Management;Canalith Repostioning;Cryotherapy;Moist Heat;Gait training;Stair training;Functional mobility training;Therapeutic activities;Therapeutic exercise;Balance training;Neuromuscular re-education;Patient/family education;Manual techniques;Passive range of motion;Dry needling;Vestibular    PT Next Visit Plan  Try Nustep to assess for pt using exercise room at gym in her community.  Review HEP and progress as able.  Add posture exercises to HEP.  Pt has fused R ankle - probably will not be able to gain more range.  assess orthostatics/vestibular if appropriate.  Assess gait velocity and reset goal baseline.      PT Home Exercise Plan  Access Code: [email or web address]; wants to transition to chair yoga and group balance class at community clubhouse    Consulted and Agree with Plan of Care  Patient       Patient will benefit from skilled therapeutic intervention in order to improve the following deficits and impairments:  Decreased balance, Decreased range of motion, Decreased strength, Difficulty walking, Dizziness, Impaired vision/preception, Pain  Visit Diagnosis: Unsteadiness on feet  Difficulty in walking, not elsewhere classified  Muscle weakness (generalized)  Dizziness and giddiness     Problem List Patient Active Problem List   Diagnosis Date Noted  . Orthostatic hypotension 05/01/2018  . OSA (obstructive sleep apnea) 10/26/2017  . Hyperlipidemia LDL goal <70 10/26/2017   . Carotid stenosis, bilateral 10/13/2016  . Atrial tachycardia, paroxysmal ([place]) 01/15/2014  . Benign essential HTN 01/01/2014  . Persistent atrial fibrillation 01/01/2014    Narda Bonds, PTA [facility] 09/21/18 3:58 PM Phone: [phone] Fax: [facility] [address] [place], [place], [address] Phone: [phone]   Fax:  [phone]  Name: SAYLEE SHERRILL MRN: [id number] Date of Birth: [birth year]

## 2018-09-28 ENCOUNTER — Ambulatory Visit: Payer: Medicare Other | Admitting: Physical Therapy

## 2018-09-28 ENCOUNTER — Encounter: Payer: Self-pay | Admitting: Physical Therapy

## 2018-09-28 DIAGNOSIS — M6281 Muscle weakness (generalized): Secondary | ICD-10-CM

## 2018-09-28 DIAGNOSIS — R2681 Unsteadiness on feet: Secondary | ICD-10-CM

## 2018-09-28 DIAGNOSIS — R262 Difficulty in walking, not elsewhere classified: Secondary | ICD-10-CM

## 2018-09-28 NOTE — Therapy (Signed)
[facility] [address] [place], [place], [address] Phone: [phone]   Fax:  [phone]  Physical Therapy Treatment  Patient Details  Name: Rhonda [name] MRN: [id number] Date of Birth: 02/12/1941 Referring Provider (PT): Lavone Orn, MD   Encounter Date: 09/28/2018  PT End of Session - 09/28/18 1257    Visit Number  3    Number of Visits  9    Date for PT Re-Evaluation  11/04/18   due to delay in appointments; 4 week POC   Authorization Type  [facility] Medicare: $30 copay.  No VL  10th visit PN    PT Start Time  1153    PT Stop Time  1239    PT Time Calculation (min)  46 min    Activity Tolerance  Patient tolerated treatment well    Behavior During Therapy  WFL for tasks assessed/performed       Past Medical History:  Diagnosis Date  . Adenomatous colon polyp    cecum, last colonoscopy 2006  . Atrial tachycardia ([place])    non sustatined with PVCs and PACs  . Bacterial meningitis    as a child  . Basilar migraine   . Carotid stenosis, bilateral 10/13/2016   1-39% bilateral by dopplers 10/2016  . Cystocele    MIld  . History of hemorrhoids   . Hx of chest pain    Atypical  . Hx of hematuria    IVP 1990 left kidney stone, cystoscopy negative  . Hyperlipidemia   . Hyperlipidemia LDL goal <70 10/26/2017  . Hypertension   . Nephrolithiasis   . OSA (obstructive sleep apnea) 10/26/2017   Severe with AHI 38/hr.  Patient refuses PAP therapy  . Osteoporosis    actonel since 2006  . PAF (paroxysmal atrial fibrillation) ([place])    radiofrequency ablation of afib [facility]  . Right facial numbness 5/12   question TIA versus migraine,cardiac monitoring neg for AFIB  . Seizures ([place]) 1980   7 years on Dilantin  . Snoring   . SOB (shortness of breath)    Chronic mild, negative cardiolite  . Tinea versicolor     Past Surgical History:  Procedure Laterality Date  . multiple ankle surgeries      There were no  vitals filed for this visit.  Subjective Assessment - 09/28/18 1156    Subjective  Pt reporting increased hip pain on L side, worse since yesterday.  Had a bone density scan yesterday with significant loss of bone density in R hip.  Would like to cancel therapy next week due to spread of virus.  Will not be attending community balance or yoga class, but would be willing to use Nustep.    Pertinent History  tinea versicolor, diplopia with prisms in glasses, SOB, seizures in 1980, R facial numbness, atrial fibrillation, osteoporosis, HTN, hyperlipidemia, h/o chest pain, cystocele, carotid stenosis, bilaterally, basilar migraine, bacterial meningitis, atrial tachycardia, multiple ankle surgeries with a R fused ankle and nephrolithiasis    Limitations  Walking;Standing    Patient Stated Goals  To be  able to transition to balance classes and chair yoga at her community    Currently in Pain?  Yes    Pain Onset  1 to 4 weeks ago                       [facility] Adult PT Treatment/Exercise - 09/28/18 1200      Exercises   Exercises  Knee/Hip      Knee/Hip Exercises: Aerobic   Nustep  L5 resistance with bilat UE and LE (seat level 11) x  minutes to assess if Nustep would be appropriate for her to use at her community        Access Code: [email or web address]  URL: [email or web address]  Date: 09/28/2018  Prepared by: Misty Stanley   Exercises  Seated Ankle Dorsiflexion AROM - 10 reps - 1 sets - 1x daily - 5x weekly  Seated Heel Raise - 10 reps - 1 sets - 1x daily - 5x weekly  Sit to Stand with Counter Support - 10 reps - 3 sets - 1x daily - 7x weekly  Standing Hip Abduction with Counter Support - 10 reps - 3 sets - 1x daily - 7x weekly  Standing Hip Extension with Counter Support - 10 reps - 3 sets - 1x daily - 7x weekly  Supine ITB Stretch with Strap - 3 sets - 20 second hold - 1x daily - 7x weekly  Supine Scapular Retraction - 10 reps - 2 sets - 6 second hold - 1x daily - 7x  weekly         PT Education - 09/28/18 1256    Education Details  updated HEP, role of WB activity to improve bone density    Person(s) Educated  Patient    Methods  Explanation;Demonstration;Handout    Comprehension  Verbalized understanding;Returned demonstration          PT Long Term Goals - 09/05/18 1744      PT LONG TERM GOAL #1   Title  Patient will demonstrate independence with balance and strengthening HEP    Time  4    Period  Weeks    Status  New    Target Date  11/04/18      PT LONG TERM GOAL #2   Title  Pt will report attending group balance classes and chair yoga for maintaining wellness    Time  4    Period  Weeks    Status  New    Target Date  11/04/18      PT LONG TERM GOAL #3   Title  Pt will improve five time sit to stand to </= 20 seconds from arm chair    Baseline  25 seconds with use of UE on arm chair, wide BOS, R foot forwards    Time  4    Period  Weeks    Status  New    Target Date  11/04/18      PT LONG TERM GOAL #4   Title  Pt will demonstrate decreased falls risk as indicated by 8 point increase in BERG    Baseline  42/56    Time  4    Period  Weeks    Status  New    Target Date  11/04/18      PT LONG TERM GOAL #5   Title  Pt will improve gait velocity to >/= 2.62 ft/sec     Baseline  TBD    Time  4    Period  Weeks    Status  New    Target Date  11/04/18      Additional Long Term Goals   Additional Long Term Goals  Yes      PT LONG TERM GOAL #6   Title  Pt will safely negotiate one step/curb without UE support with supervision    Time  4    Period  Weeks    Status  New    Target Date  11/04/18      PT LONG TERM GOAL #7   Title  Vestibular goal if needed/Pt will verbalize and demonstrate techniques to decrease effects of orthostasis after prolonged sitting    Time  4    Period  Weeks    Status  New    Target Date  11/04/18            Plan - 09/28/18 1257    Clinical Impression Statement  Continued to  provide education about activities pt can do for continued strengthening, ROM and endurance training next week due to pt asking to cancel appointments.  Reviewed set up and use of Nustep and appropriate exertion level to work at.  Also reviewed and revised HEP to include standing and WB exercises due to recent findings of osteoporosis.  Also added supine hip TFL stretch on L for pain management and initiated supine postural exercises.  Pt tolerated well, will continue to address once pt returns to continue to progress towards LTG.    Rehab Potential  Good    PT Frequency  2x / week    PT Duration  4 weeks    PT Treatment/Interventions  ADLs/Self Care Home Management;Canalith Repostioning;Cryotherapy;Moist Heat;Gait training;Stair training;Functional mobility training;Therapeutic activities;Therapeutic exercise;Balance training;Neuromuscular re-education;Patient/family education;Manual techniques;Passive range of motion;Dry needling;Vestibular    PT Next Visit Plan  How is L hip feeling?  Did she do Nustep during time off?  continue to add posture and balance exercises to HEP.  Pt has fused R ankle - probably will not be able to gain more range.     PT Home Exercise Plan  Access Code: [email or web address]; wants to transition to chair yoga and group balance class at community clubhouse    Consulted and Agree with Plan of Care  Patient       Patient will benefit from skilled therapeutic intervention in order to improve the following deficits and impairments:  Decreased balance, Decreased range of motion, Decreased strength, Difficulty walking, Dizziness, Impaired vision/preception, Pain  Visit Diagnosis: Unsteadiness on feet  Difficulty in walking, not elsewhere classified  Muscle weakness (generalized)     Problem List Patient Active Problem List   Diagnosis Date Noted  . Orthostatic hypotension 05/01/2018  . OSA (obstructive sleep apnea) 10/26/2017  . Hyperlipidemia LDL goal <70 10/26/2017  .  Carotid stenosis, bilateral 10/13/2016  . Atrial tachycardia, paroxysmal ([place]) 01/15/2014  . Benign essential HTN 01/01/2014  . Persistent atrial fibrillation 01/01/2014    Rico Junker, PT, DPT 09/28/18    1:05 PM    [facility] [address] [place], [place], [address] Phone: [phone]   Fax:  [phone]  Name: Rhonda [name] MRN: [id number] Date of Birth: 03/05/1941

## 2018-09-28 NOTE — Patient Instructions (Addendum)
Access Code: [email or web address]  URL: [email or web address]  Date: 09/28/2018  Prepared by: Misty Stanley   Exercises  Seated Ankle Dorsiflexion AROM - 10 reps - 1 sets - 1x daily - 5x weekly  Seated Heel Raise - 10 reps - 1 sets - 1x daily - 5x weekly  Sit to Stand with Counter Support - 10 reps - 3 sets - 1x daily - 7x weekly  Standing Hip Abduction with Counter Support - 10 reps - 3 sets - 1x daily - 7x weekly  Standing Hip Extension with Counter Support - 10 reps - 3 sets - 1x daily - 7x weekly  Supine ITB Stretch with Strap - 3 sets - 20 second hold - 1x daily - 7x weekly  Supine Scapular Retraction - 10 reps - 2 sets - 6 second hold - 1x daily - 7x weekly

## 2018-10-03 ENCOUNTER — Ambulatory Visit: Payer: Medicare Other | Admitting: Physical Therapy

## 2018-10-05 ENCOUNTER — Encounter: Payer: Medicare Other | Admitting: Physical Therapy

## 2018-10-08 ENCOUNTER — Telehealth: Payer: Self-pay | Admitting: Physical Therapy

## 2018-10-08 NOTE — Telephone Encounter (Signed)
[facility] was contacted today regarding the temporary closing of [facility] due to Covid-19.  Therapist discussed:  Possible E-visits and possible re-open date.    Patient IS NOT interested in further information for an e-visit, virtual check in, or telehealth visit, if those services become available.  Patient has not been able to do HEP due to ongoing hip pain.      [facility] will follow up with patients when we are able to resume care.  Rico Junker, PT, DPT 10/08/18    12:01 PM    [facility] [address] [place], [place]  [address] Phone:  [phone] Fax:  [phone]

## 2018-10-10 ENCOUNTER — Ambulatory Visit: Payer: Medicare Other | Admitting: Physical Therapy

## 2018-10-18 ENCOUNTER — Other Ambulatory Visit: Payer: Self-pay | Admitting: Cardiology

## 2018-10-18 NOTE — Telephone Encounter (Signed)
Eliquis 5mg  refill request received; pt is 78 yrs old, wt-87.6kg, Crea-0.74 on 05/01/2018, last seen by [name] on 05/01/2018; will send in refill.

## 2018-11-06 ENCOUNTER — Telehealth: Payer: Self-pay

## 2018-11-06 NOTE — Telephone Encounter (Signed)
Called and obtained verbal consent from patient for virtual visit date and time listed below.    TELEPHONE CALL NOTE  This patient has been deemed a candidate for follow-up tele-health visit to limit community exposure during the Covid-19 pandemic. I spoke with the patient via phone to discuss instructions. This has been outlined on the patient's AVS (dotphrase: hcevisitinfo). The patient was advised to review the section on consent for treatment as well. The patient will receive a phone call 2-3 days prior to their E-Visit at which time consent will be verbally confirmed.   A Virtual Office Visit appointment type has been scheduled for 11/09/2018 at 9:20am with Dr Radford Pax, with "VIDEO" or "TELEPHONE" in the appointment notes - patient prefers TELEPHONE type.  I have either confirmed the patient is active in MyChart or offered to send sign-up link to phone/email via Mychart icon beside patient's photo. [place], [facility] 11/06/2018 4:12 PM     Virtual Visit Pre-Appointment Phone Call  "(Name), I am calling you today to discuss your upcoming appointment. We are currently trying to limit exposure to the virus that causes COVID-19 by seeing patients at home rather than in the office."  1. "What is the BEST phone number to call the day of the visit?" - include this in appointment notes  2. "Do you have or have access to (through a family member/friend) a smartphone with video capability that we can use for your visit?" a. If yes - list this number in appt notes as "cell" (if different from BEST phone #) and list the appointment type as a VIDEO visit in appointment notes b. If no - list the appointment type as a PHONE visit in appointment notes  3. Confirm consent - "In the setting of the current Covid19 crisis, you are scheduled for a (phone or video) visit with your provider on (date) at (time).  Just as we do with many in-office visits, in order for you to participate in this visit, we  must obtain consent.  If you'd like, I can send this to your mychart (if signed up) or email for you to review.  Otherwise, I can obtain your verbal consent now.  All virtual visits are billed to your insurance company just like a normal visit would be.  By agreeing to a virtual visit, we'd like you to understand that the technology does not allow for your provider to perform an examination, and thus may limit your provider's ability to fully assess your condition. If your provider identifies any concerns that need to be evaluated in person, we will make arrangements to do so.  Finally, though the technology is pretty good, we cannot assure that it will always work on either your or our end, and in the setting of a video visit, we may have to convert it to a phone-only visit.  In either situation, we cannot ensure that we have a secure connection.  Are you willing to proceed?" STAFF: Did the patient verbally acknowledge consent to telehealth visit? Document YES/NO here: YES  4. Advise patient to be prepared - "Two hours prior to your appointment, go ahead and check your blood pressure, pulse, oxygen saturation, and your weight (if you have the equipment to check those) and write them all down. When your visit starts, your provider will ask you for this information. If you have an Apple Watch or Kardia device, please plan to have heart rate information ready on the day of your appointment. Please have  a pen and paper handy nearby the day of the visit as well."  5. Give patient instructions for MyChart download to smartphone OR Doximity/Doxy.me as below if video visit (depending on what platform provider is using)  6. Inform patient they will receive a phone call 15 minutes prior to their appointment time (may be from unknown caller ID) so they should be prepared to answer    TELEPHONE CALL NOTE  Rhonda [name] has been deemed a candidate for a follow-up tele-health visit to limit community exposure  during the Covid-19 pandemic. I spoke with the patient via phone to ensure availability of phone/video source, confirm preferred email & phone number, and discuss instructions and expectations.  I reminded Rhonda [name] to be prepared with any vital sign and/or heart rhythm information that could potentially be obtained via home monitoring, at the time of her visit. I reminded Rhonda [name] to expect a phone call prior to her visit.  [name], CMA 11/06/2018 4:14 PM   INSTRUCTIONS FOR DOWNLOADING THE MYCHART APP TO SMARTPHONE  - The patient must first make sure to have activated MyChart and know their login information - If Apple, go to [employer] and type in MyChart in the search bar and download the app. If Android, ask patient to go to [employer] and type in [place] in the search bar and download the app. The app is free but as with any other app downloads, their phone may require them to verify saved payment information or Apple/Android password.  - The patient will need to then log into the app with their MyChart username and password, and select [facility] as their healthcare provider to link the account. When it is time for your visit, go to the MyChart app, find appointments, and click Begin Video Visit. Be sure to Select Allow for your device to access the Microphone and Camera for your visit. You will then be connected, and your provider will be with you shortly.  **If they have any issues connecting, or need assistance please contact MyChart service desk [phone])**  **If using a computer, in order to ensure the best quality for their visit they will need to use either of the following Internet Browsers: [employer], or Google Chrome**  IF USING DOXIMITY or DOXY.ME - The patient will receive a link just prior to their visit by text.     FULL LENGTH CONSENT FOR TELE-HEALTH VISIT   I hereby voluntarily request, consent and authorize [facility]  and its employed or contracted physicians, physician assistants, nurse practitioners or other licensed health care professionals (the Practitioner), to provide me with telemedicine health care services (the "Services") as deemed necessary by the treating Practitioner. I acknowledge and consent to receive the Services by the Practitioner via telemedicine. I understand that the telemedicine visit will involve communicating with the Practitioner through live audiovisual communication technology and the disclosure of certain medical information by electronic transmission. I acknowledge that I have been given the opportunity to request an in-person assessment or other available alternative prior to the telemedicine visit and am voluntarily participating in the telemedicine visit.  I understand that I have the right to withhold or withdraw my consent to the use of telemedicine in the course of my care at any time, without affecting my right to future care or treatment, and that the Practitioner or I may terminate the telemedicine visit at any time. I understand that I have the right to inspect all  information obtained and/or recorded in the course of the telemedicine visit and may receive copies of available information for a reasonable fee.  I understand that some of the potential risks of receiving the Services via telemedicine include:  Marland Kitchen Delay or interruption in medical evaluation due to technological equipment failure or disruption; . Information transmitted may not be sufficient (e.g. poor resolution of images) to allow for appropriate medical decision making by the Practitioner; and/or  . In rare instances, security protocols could fail, causing a breach of personal health information.  Furthermore, I acknowledge that it is my responsibility to provide information about my medical history, conditions and care that is complete and accurate to the best of my ability. I acknowledge that Practitioner's advice,  recommendations, and/or decision may be based on factors not within their control, such as incomplete or inaccurate data provided by me or distortions of diagnostic images or specimens that may result from electronic transmissions. I understand that the practice of medicine is not an exact science and that Practitioner makes no warranties or guarantees regarding treatment outcomes. I acknowledge that I will receive a copy of this consent concurrently upon execution via email to the email address I last provided but may also request a printed copy by calling the office of [facility].    I understand that my insurance will be billed for this visit.   I have read or had this consent read to me. . I understand the contents of this consent, which adequately explains the benefits and risks of the Services being provided via telemedicine.  . I have been provided ample opportunity to ask questions regarding this consent and the Services and have had my questions answered to my satisfaction. . I give my informed consent for the services to be provided through the use of telemedicine in my medical care  By participating in this telemedicine visit I agree to the above.

## 2018-11-08 NOTE — Progress Notes (Signed)
Virtual Visit via Video Note   This visit type was conducted due to national recommendations for restrictions regarding the COVID-19 Pandemic (e.g. social distancing) in an effort to limit this patient's exposure and mitigate transmission in our community.  Due to her co-morbid illnesses, this patient is at least at moderate risk for complications without adequate follow up.  This format is felt to be most appropriate for this patient at this time.  All issues noted in this document were discussed and addressed.  A limited physical exam was performed with this format.  Please refer to the patient's chart for her consent to telehealth for [facility]  Evaluation Performed:  Follow-up visit  This visit type was conducted due to national recommendations for restrictions regarding the COVID-19 Pandemic (e.g. social distancing).  This format is felt to be most appropriate for this patient at this time.  All issues noted in this document were discussed and addressed.  No physical exam was performed (except for noted visual exam findings with Video Visits).  Please refer to the patient's chart (MyChart message for video visits and phone note for telephone visits) for the patient's consent to telehealth for [facility].  Date:  11/09/2018   ID:  Rhonda [name], DOB [birth year], MRN [id number]  Patient Location:  Home  Provider location:   [place]  PCP:  Lavone Orn, MD  Cardiologist:  Fransico Him, MD  Electrophysiologist:  None   Chief Complaint:  Atrial fibrillation, carotid stenosis, PVCs and nonsustained atrial tachycardia  History of Present Illness:    Rhonda [name] is a 78 y.o. female who presents via audio/video conferencing for a telehealth visit today.    Rhonda [name] is a 78 y.o. female with a hx of persistentAF s/p afib ablation 12 years ago, bilateral carotid stenosis <50%, PVC's, PAC's and nonsustained atrial tachycardia.  She continues to have intermittent  episodes of atrial fibrillation and was referred to A. fib clinic as well as Dr. Rayann Heman in EP clinic.  Repeat A. fib ablation versus AAD therapy were discussed with the patient and she was adamant she did not want to consider ablation or any other drugs.  She has a CHADS2VASC score of 4 and is on Eliquis for anticoagulation.  She underwent home sleep study showing severe sleep apnea with an AHI of 38.1/h.  She underwent CPAP titration but could not be adequately titrated due to ongoing respiratory events.  A BiPAP titration was recommended but patient refused any further studies and stated that she was not going to use a Pap device.   She went back to see Dr. Ola Spurr who had done her original ablation and she had a stress test and Ziopatch.  The stress test was fine and he placed her on Felcainide.    She is here today for followup and is doing well.  She has chronic DOE which she thinks is stable.  It is felt that her DOE is related to deconditioning from sedentary state as she gets it when she goes outside to work in her yard. She denies any chest pain or pressure,  PND, orthopnea, LE edema or syncope. She is compliant with her meds and is tolerating meds with no SE.  She still has occasional dizziness but fairly stable.  She is doing well with the Flecainide and has had minimal breakthrough of palpitations.    The patient does not have symptoms concerning for COVID-19 infection (fever, chills, cough, or new shortness of breath).  Prior CV studies:   The following studies were reviewed today:  None  Past Medical History:  Diagnosis Date  . Adenomatous colon polyp    cecum, last colonoscopy 2006  . Atrial tachycardia ([place])    non sustatined with PVCs and PACs  . Bacterial meningitis    as a child  . Basilar migraine   . Carotid stenosis, bilateral 10/13/2016   1-39% bilateral by dopplers 10/2016  . Cystocele    MIld  . History of hemorrhoids   . Hx of chest pain    Atypical  . Hx of  hematuria    IVP 1990 left kidney stone, cystoscopy negative  . Hyperlipidemia   . Hyperlipidemia LDL goal <70 10/26/2017  . Hypertension   . Nephrolithiasis   . OSA (obstructive sleep apnea) 10/26/2017   Severe with AHI 38/hr.  Patient refuses PAP therapy  . Osteoporosis    actonel since 2006  . PAF (paroxysmal atrial fibrillation) ([place])    radiofrequency ablation of afib [facility]  . Right facial numbness 5/12   question TIA versus migraine,cardiac monitoring neg for AFIB  . Seizures ([place]) 1980   7 years on Dilantin  . Snoring   . SOB (shortness of breath)    Chronic mild, negative cardiolite  . Tinea versicolor    Past Surgical History:  Procedure Laterality Date  . multiple ankle surgeries       Current Meds  Medication Sig  . acetaminophen (TYLENOL) 325 MG tablet Take 650 mg by mouth every 6 (six) hours as needed for mild pain.  Marland Kitchen ALPRAZolam (XANAX) 0.5 MG tablet Take 0.5 mg by mouth at bedtime as needed for anxiety. USES FOR SLEEP  . calcium citrate-vitamin D (CITRACAL+D) 315-200 MG-UNIT per tablet Take 1 tablet by mouth daily.  Marland Kitchen diltiazem (CARDIZEM) 30 MG tablet Take 30 mg by mouth every 4 (four) hours as needed (afib HR >100).  Marland Kitchen ELIQUIS 5 MG TABS tablet TAKE 1 TABLET BY MOUTH TWO  TIMES DAILY  . flecainide (TAMBOCOR) 50 MG tablet Take 50 mg by mouth 2 (two) times a day.  . Meth-Hyo-M Bl-Na Phos-Ph Sal (URIBEL) 118 MG CAPS Take 118 mg by mouth daily.  Marland Kitchen omeprazole (PRILOSEC OTC) 20 MG tablet Take 20 mg by mouth as needed.     Allergies:   Erythromycin   Social History   Tobacco Use  . Smoking status: Never Smoker  . Smokeless tobacco: Never Used  Substance Use Topics  . Alcohol use: Yes    Comment: 1-2 glasses of wine weekly  . Drug use: No     Family Hx: The patient's family history includes Heart attack in her father; Parkinson's disease in her mother.  ROS:   Please see the history of present illness.     All other systems reviewed and are  negative.   Labs/Other Tests and Data Reviewed:    Recent Labs: 05/01/2018: BUN 15; Creatinine, Ser 0.74; Hemoglobin 12.2; Platelets 227; Potassium 4.3; Sodium 142; TSH 4.250   Recent Lipid Panel No results found for: CHOL, TRIG, HDL, CHOLHDL, LDLCALC, LDLDIRECT  Wt Readings from Last 3 Encounters:  11/09/18 194 lb (88 kg)  05/01/18 193 lb 3.2 oz (87.6 kg)  04/30/18 190 lb (86.2 kg)     Objective:    Vital Signs:  BP 137/84   Pulse 73   Ht 5\' 5"  (1.651 m)   Wt 194 lb (88 kg)   BMI 32.28 kg/m    CONSTITUTIONAL:  Well nourished,  well developed female in no acute distress.  EYES: anicteric MOUTH: oral mucosa is pink RESPIRATORY: Normal respiratory effort, symmetric expansion CARDIOVASCULAR: No peripheral edema SKIN: No rash, lesions or ulcers MUSCULOSKELETAL: no digital cyanosis NEURO: Cranial Nerves II-XII grossly intact, moves all extremities PSYCH: Intact judgement and insight.  A&O x 3, Mood/affect appropriate   ASSESSMENT & PLAN:    1.  Persistent atrial fibrillation - s/p afib ablation 12 years ago.  she has refused any consideration of repeat ablation or ADD therapy.  She will continue on Cardizem PRN for breakthrough afib as well as Eliquis 5mg  BID and Flecainide 50mg  BID.  Her last creatinine was 0.7 in  09/2018.  She denies any problems with bleeding except for 1 episode earlier this month with hematuria and this has resolved and she is seeing Urology.  2.  Hypertension - her BP is controlled today at home.  She has not been on any antihypertensive meds.  3.  Bilateral carotid stenosis - her last dopplers 10/2016 showed mild bilateral stenosis 1-39%. I will repeat dopplers but  due to [place] crisis will wait until July or August.  She is not on ASA due to [place] and is statin intolerant.   4.  OSA - she underwent PSG showing severe sleep apnea with an AHI of 38.1/h.  She underwent CPAP titration but could not be adequately titrated due to ongoing respiratory events.   A BiPAP titration was recommended but patient refused any further studies and stated that she was not going to use a Pap device.    5.  Hyperlipidemia - her LDL goal is < 70.  Her last LDL was 126 a year ago.  She is statin intolerant.   6.  COVID-19 Education:The signs and symptoms of COVID-19 were discussed with the patient and how to seek care for testing (follow up with PCP or arrange E-visit).  The importance of social distancing was discussed today.  Patient Risk:   After full review of this patient's clinical status, I feel that they are at least moderate risk at this time.  Time:   Today, I have spent 20 minutes directly with the patient on video discussing medical problems including atrial fibrillation, OSA, hypertension, hyperlipidemia, carotid stenosis.  We also reviewed the symptoms of COVID 19 and the ways to protect against contracting the virus with telehealth technology.  I spent an additional 10 minutes reviewing patient's chart including sleep study and carotid dopplers.  Medication Adjustments/Labs and Tests Ordered: Current medicines are reviewed at length with the patient today.  Concerns regarding medicines are outlined above.  Tests Ordered: No orders of the defined types were placed in this encounter.  Medication Changes: No orders of the defined types were placed in this encounter.   Disposition:  Follow up in 6 month(s)  Signed, Fransico Him, MD  11/09/2018 9:23 AM    [facility]

## 2018-11-09 ENCOUNTER — Encounter: Payer: Self-pay | Admitting: Cardiology

## 2018-11-09 ENCOUNTER — Other Ambulatory Visit: Payer: Self-pay

## 2018-11-09 ENCOUNTER — Telehealth (INDEPENDENT_AMBULATORY_CARE_PROVIDER_SITE_OTHER): Payer: Medicare Other | Admitting: Cardiology

## 2018-11-09 VITALS — BP 137/84 | HR 73 | Ht 65.0 in | Wt 194.0 lb

## 2018-11-09 DIAGNOSIS — Z7189 Other specified counseling: Secondary | ICD-10-CM | POA: Diagnosis not present

## 2018-11-09 DIAGNOSIS — I10 Essential (primary) hypertension: Secondary | ICD-10-CM | POA: Diagnosis not present

## 2018-11-09 DIAGNOSIS — I4819 Other persistent atrial fibrillation: Secondary | ICD-10-CM | POA: Diagnosis not present

## 2018-11-09 DIAGNOSIS — G4733 Obstructive sleep apnea (adult) (pediatric): Secondary | ICD-10-CM

## 2018-11-09 DIAGNOSIS — E785 Hyperlipidemia, unspecified: Secondary | ICD-10-CM

## 2018-11-09 DIAGNOSIS — I6523 Occlusion and stenosis of bilateral carotid arteries: Secondary | ICD-10-CM | POA: Diagnosis not present

## 2018-11-09 DIAGNOSIS — I951 Orthostatic hypotension: Secondary | ICD-10-CM

## 2018-11-09 NOTE — Patient Instructions (Addendum)
Medication Instructions:  Your physician recommends that you continue on your current medications as directed. Please refer to the Current Medication list given to you today.  If you need a refill on your cardiac medications before your next appointment, please call your pharmacy.   Lab work: None If you have labs (blood work) drawn today and your tests are completely normal, you will receive your results only by: Marland Kitchen MyChart Message (if you have MyChart) OR . A paper copy in the mail If you have any lab test that is abnormal or we need to change your treatment, we will call you to review the results.  Testing/Procedures: Your physician has requested that you have a carotid duplex around July or August. This test is an ultrasound of the carotid arteries in your neck. It looks at blood flow through these arteries that supply the brain with blood. Allow one hour for this exam. There are no restrictions or special instructions.  Follow-Up: At [facility], you and your health needs are our priority.  As part of our continuing mission to provide you with exceptional heart care, we have created designated Provider Care Teams.  These Care Teams include your primary Cardiologist (physician) and Advanced Practice Providers (APPs -  Physician Assistants and Nurse Practitioners) who all work together to provide you with the care you need, when you need it. You will need a follow up appointment in 6 months.  Please call our office 2 months in advance to schedule this appointment.  You may see Fransico Him, MD or one of the following Advanced Practice Providers on your designated Care Team:   [place], PA-C Melina Copa, PA-C . [name], PA-C

## 2019-02-13 ENCOUNTER — Other Ambulatory Visit: Payer: Self-pay

## 2019-02-13 ENCOUNTER — Ambulatory Visit (HOSPITAL_COMMUNITY)
Admission: RE | Admit: 2019-02-13 | Discharge: 2019-02-13 | Disposition: A | Discharge status: Home / Self Care | Payer: Medicare Other | Source: Ambulatory Visit | Attending: Internal Medicine | Admitting: Internal Medicine

## 2019-02-13 ENCOUNTER — Telehealth: Payer: Self-pay | Admitting: Cardiology

## 2019-02-13 DIAGNOSIS — I6523 Occlusion and stenosis of bilateral carotid arteries: Secondary | ICD-10-CM | POA: Insufficient documentation

## 2019-02-13 NOTE — Telephone Encounter (Signed)
New Message   Patient is returning call in reference to carotid results. Please call to discuss.

## 2019-02-13 NOTE — Telephone Encounter (Signed)
Pt has been notified of carotid results by phone with verbal understanding. Pt thanked me for the call. The patient has been notified of the result and verbalized understanding.  All questions (if any) were answered. Rhonda [name], [facility] 02/13/2019 3:18 PM

## 2019-04-04 ENCOUNTER — Other Ambulatory Visit: Payer: Self-pay | Admitting: Cardiology

## 2019-04-04 NOTE — Telephone Encounter (Signed)
Prescription refill request for Eliquis received.  Last office visit: [place] (11-09-2018) Scr: 0.74  (05-01-2018) Age:  78 y.o. Weight: 87.6 kg (05-01-2018)  Prescription refill sent.

## 2019-05-27 ENCOUNTER — Ambulatory Visit: Payer: Medicare Other | Admitting: Cardiology

## 2019-05-27 ENCOUNTER — Encounter: Payer: Self-pay | Admitting: Cardiology

## 2019-05-27 ENCOUNTER — Other Ambulatory Visit: Payer: Self-pay

## 2019-05-27 VITALS — BP 132/70 | HR 73 | Ht 65.0 in | Wt 197.0 lb

## 2019-05-27 DIAGNOSIS — E785 Hyperlipidemia, unspecified: Secondary | ICD-10-CM

## 2019-05-27 DIAGNOSIS — I6523 Occlusion and stenosis of bilateral carotid arteries: Secondary | ICD-10-CM

## 2019-05-27 DIAGNOSIS — G4733 Obstructive sleep apnea (adult) (pediatric): Secondary | ICD-10-CM | POA: Diagnosis not present

## 2019-05-27 DIAGNOSIS — I4819 Other persistent atrial fibrillation: Secondary | ICD-10-CM

## 2019-05-27 DIAGNOSIS — R0602 Shortness of breath: Secondary | ICD-10-CM

## 2019-05-27 DIAGNOSIS — I10 Essential (primary) hypertension: Secondary | ICD-10-CM | POA: Diagnosis not present

## 2019-05-27 MED ORDER — FLECAINIDE ACETATE 50 MG PO TABS: 50.0000 mg | ORAL_TABLET | Freq: Two times a day (BID) | ORAL | 1 refills | Status: DC

## 2019-05-27 NOTE — Progress Notes (Addendum)
Cardiology Office Note:    Date:  05/27/2019   ID:  Rhonda [name], DOB 11/13/1940, MRN [id number]  PCP:  Lavone Orn, MD  Cardiologist:  Fransico Him, MD    Referring MD: Lavone Orn, MD   Chief Complaint  Patient presents with  . Atrial Fibrillation  . Sleep Apnea  . Hypertension    History of Present Illness:    Rhonda [name] is a 78 y.o. female with a hx of persistentAF s/p afib ablation12 years ago, bilateral carotid stenosis <50%, PVC's, PAC's and nonsustained atrial tachycardia.She has been followed in afib clinic with Dr. Rayann Heman.. Repeat A. fib ablation versus AAD therapy werediscussed with the patient and she was adamant she did not want to consider ablation or any otherdrugs.She has Franklin score of 4and is on Eliquis for anticoagulation.    She underwent home sleep study showing severe sleep apnea with an AHI of 38.1/h. She underwent CPAP titration but could not be adequately titrated due to ongoing respiratory events. A BiPAP titration was recommended but patient refused any further studies and stated that she was not going to use a Pap device.  She went back to see Dr. Ola Spurr who had done her original ablation and she had a stress test and Ziopatch.  The stress test was fine and he placed her on Flecainide.    She is here today for followup and is doing well.  She denies any chest pain or pressure, PND, orthopnea, LE edema, dizziness, palpitations or syncope. She does have chronic DOE which she thinks has gotten worse and she attributes it to her having more weakness.  She had a nuclear stress test 06/2018 with Dr. Ola Spurr prior to starting Flecainide and this showed normal LVF and no ischemia. She is compliant with her meds and is tolerating meds with no SE.    Past Medical History:  Diagnosis Date  . Adenomatous colon polyp    cecum, last colonoscopy 2006  . Atrial tachycardia ([place])    non sustatined with PVCs and PACs  . Bacterial  meningitis    as a child  . Basilar migraine   . Carotid stenosis, bilateral 10/13/2016   1-39% bilateral by dopplers 10/2016  . Cystocele    MIld  . History of hemorrhoids   . Hx of chest pain    Atypical  . Hx of hematuria    IVP 1990 left kidney stone, cystoscopy negative  . Hyperlipidemia   . Hyperlipidemia LDL goal <70 10/26/2017  . Hypertension   . Nephrolithiasis   . OSA (obstructive sleep apnea) 10/26/2017   Severe with AHI 38/hr.  Patient refuses PAP therapy  . Osteoporosis    actonel since 2006  . PAF (paroxysmal atrial fibrillation) ([place])    radiofrequency ablation of afib [facility]  . Right facial numbness 5/12   question TIA versus migraine,cardiac monitoring neg for AFIB  . Seizures ([place]) 1980   7 years on Dilantin  . Snoring   . SOB (shortness of breath)    Chronic mild, negative cardiolite  . Tinea versicolor     Past Surgical History:  Procedure Laterality Date  . multiple ankle surgeries      Current Medications: Current Meds  Medication Sig  . acetaminophen (TYLENOL) 325 MG tablet Take 650 mg by mouth every 6 (six) hours as needed for mild pain.  Marland Kitchen alendronate (FOSAMAX) 70 MG tablet Take 70 mg by mouth once a week. Take with a full glass of water on an  empty stomach.  . ALPRAZolam (XANAX) 0.5 MG tablet Take 0.5 mg by mouth at bedtime as needed for anxiety. USES FOR SLEEP  . Ascorbic Acid (VITAMIN C PO) Take 1 tablet by mouth daily.  . calcium citrate-vitamin D (CITRACAL+D) 315-200 MG-UNIT per tablet Take 1 tablet by mouth daily.  . Cholecalciferol (VITAMIN D3 PO) Take 625 mg by mouth daily.  Marland Kitchen diltiazem (CARDIZEM) 30 MG tablet Take 30 mg by mouth every 4 (four) hours as needed (afib HR >100).  Marland Kitchen ELIQUIS 5 MG TABS tablet TAKE 1 TABLET BY MOUTH  TWICE DAILY  . flecainide (TAMBOCOR) 50 MG tablet Take 50 mg by mouth 2 (two) times a day.  . Meth-Hyo-M Bl-Na Phos-Ph Sal (URIBEL) 118 MG CAPS Take 118 mg by mouth daily.  Marland Kitchen omeprazole (PRILOSEC OTC) 20 MG  tablet Take 20 mg by mouth as needed.     Allergies:   Erythromycin   Social History   Socioeconomic History  . Marital status: Married    Spouse name: Not on file  . Number of children: Not on file  . Years of education: Not on file  . Highest education level: Not on file  Occupational History  . Not on file  Social Needs  . Financial resource strain: Not on file  . Food insecurity    Worry: Not on file    Inability: Not on file  . Transportation needs    Medical: Not on file    Non-medical: Not on file  Tobacco Use  . Smoking status: Never Smoker  . Smokeless tobacco: Never Used  Substance and Sexual Activity  . Alcohol use: Yes    Comment: 1-2 glasses of wine weekly  . Drug use: No  . Sexual activity: Not on file  Lifestyle  . Physical activity    Days per week: Not on file    Minutes per session: Not on file  . Stress: Not on file  Relationships  . Social Herbalist on phone: Not on file    Gets together: Not on file    Attends religious service: Not on file    Active member of club or organization: Not on file    Attends meetings of clubs or organizations: Not on file    Relationship status: Not on file  Other Topics Concern  . Not on file  Social History Narrative  . Not on file     Family History: The patient's family history includes Heart attack in her father; Parkinson's disease in her mother.  ROS:   Please see the history of present illness.    ROS  All other systems reviewed and negative.   EKGs/Labs/Other Studies Reviewed:    The following studies were reviewed today: none  EKG:  EKG is notordered today.    Recent Labs: No results found for requested labs within last 8760 hours.   Recent Lipid Panel No results found for: CHOL, TRIG, HDL, CHOLHDL, VLDL, LDLCALC, LDLDIRECT  Physical Exam:    VS:  BP 132/70   Pulse 73   Ht 5\' 5"  (1.651 m)   Wt 197 lb (89.4 kg)   SpO2 95%   BMI 32.78 kg/m     Wt Readings from Last 3  Encounters:  05/27/19 197 lb (89.4 kg)  11/09/18 194 lb (88 kg)  05/01/18 193 lb 3.2 oz (87.6 kg)     GEN:  Well nourished, well developed in no acute distress HEENT: Normal NECK: No JVD; No carotid  bruits LYMPHATICS: No lymphadenopathy CARDIAC: RRR, no murmurs, rubs, gallops RESPIRATORY:  Clear to auscultation without rales, wheezing or rhonchi  ABDOMEN: Soft, non-tender, non-distended MUSCULOSKELETAL:  No edema; No deformity  SKIN: Warm and dry NEUROLOGIC:  Alert and oriented x 3 PSYCHIATRIC:  Normal affect   ASSESSMENT:    1. Persistent atrial fibrillation ([place] [place])   2. Benign essential HTN   3. Carotid stenosis, bilateral   4. OSA (obstructive sleep apnea)   5. Hyperlipidemia LDL goal <70    PLAN:    In order of problems listed above:  1.  Persistent atrial fibrillation -s/p afib ablation 12 years ago -recurrent PAF and was not interested in pursuing repeat ablation -seen by Dr. Ola Spurr and started on Flecainide 50mg  BID -denies any further palpitations -continue Flecainide 50mg  BID, Eilquis 5mg  BID -denies any bleeding problems -continue Cardizem PRN for breakthrough palpitations -check BMET and Hbg -she had a nuclear stress test prior to initiating flecainide that showed no ischemia -she has not had any repeat ETT done on flecainide to rule out exercise induced arrhythmias but unable to do as she is not able to walk on a treadmill.  2.  HTN -BP controlled -she has not required any antihypertensive meds  3.  Bilateral carotid artery stenosis -dopplers 01/2019 showed 1-39% left carotid stenosis  -repeat dopplers in 2 years -No ASA due to DOAC -statin intolerant  4.  OSA -PSG showed severe OSA but did not tolerate CPAP due to ongoing respiratory events and refused BiPAP  5.  HLD -LDL goal < 70 due to carotid disease -LDL was 126 on 08/16/2017 -statin intolerant  6.  DOE -this is chronic but she thinks it has gotten worse -Leane Call 06/2018 showed  no ischemia -will repeat echo to make sure LVF still intact -likely related to deconditioning and sedentary lifestyle   Medication Adjustments/Labs and Tests Ordered: Current medicines are reviewed at length with the patient today.  Concerns regarding medicines are outlined above.  No orders of the defined types were placed in this encounter.  No orders of the defined types were placed in this encounter.   Signed, Fransico Him, MD  05/27/2019 1:05 PM    [place]

## 2019-05-27 NOTE — Patient Instructions (Signed)
Medication Instructions:   Your physician recommends that you continue on your current medications as directed. Please refer to the Current Medication list given to you today.  *If you need a refill on your cardiac medications before your next appointment, please call your pharmacy*   Lab Work:  TODAY-BMET AND CBC  If you have labs (blood work) drawn today and your tests are completely normal, you will receive your results only by: Marland Kitchen MyChart Message (if you have MyChart) OR . A paper copy in the mail If you have any lab test that is abnormal or we need to change your treatment, we will call you to review the results.   Testing/Procedures:  Your physician has requested that you have an echocardiogram. Echocardiography is a painless test that uses sound waves to create images of your heart. It provides your doctor with information about the size and shape of your heart and how well your heart's chambers and valves are working. This procedure takes approximately one hour. There are no restrictions for this procedure.    Follow-Up: At [facility], you and your health needs are our priority.  As part of our continuing mission to provide you with exceptional heart care, we have created designated Provider Care Teams.  These Care Teams include your primary Cardiologist (physician) and Advanced Practice Providers (APPs -  Physician Assistants and Nurse Practitioners) who all work together to provide you with the care you need, when you need it.  Your next appointment:   6 months  The format for your next appointment:   Either In Person or Virtual  Provider:   Fransico Him, MD

## 2019-05-28 LAB — CBC
Hematocrit: 35.5 % (ref 34.0–46.6)
Hemoglobin: 11.9 g/dL (ref 11.1–15.9)
MCH: 31.2 pg (ref 26.6–33.0)
MCHC: 33.5 g/dL (ref 31.5–35.7)
MCV: 93 fL (ref 79–97)
Platelets: 220 10*3/uL (ref 150–450)
RBC: 3.81 x10E6/uL (ref 3.77–5.28)
RDW: 13.2 % (ref 11.7–15.4)
WBC: 6.4 10*3/uL (ref 3.4–10.8)

## 2019-05-28 LAB — BASIC METABOLIC PANEL
BUN/Creatinine Ratio: 25 (ref 12–28)
BUN: 18 mg/dL (ref 8–27)
CO2: 21 mmol/L (ref 20–29)
Calcium: 9.2 mg/dL (ref 8.7–10.3)
Chloride: 107 mmol/L — ABNORMAL HIGH (ref 96–106)
Creatinine, Ser: 0.73 mg/dL (ref 0.57–1.00)
GFR calc Af Amer: 92 mL/min/{1.73_m2} (ref >59)
GFR calc non Af Amer: 80 mL/min/{1.73_m2} (ref >59)
Glucose: 83 mg/dL (ref 65–99)
Potassium: 4.5 mmol/L (ref 3.5–5.2)
Sodium: 143 mmol/L (ref 134–144)

## 2019-06-06 ENCOUNTER — Other Ambulatory Visit: Payer: Self-pay

## 2019-06-06 ENCOUNTER — Ambulatory Visit (HOSPITAL_COMMUNITY): Payer: Medicare Other | Attending: Cardiology

## 2019-06-06 DIAGNOSIS — I4819 Other persistent atrial fibrillation: Secondary | ICD-10-CM | POA: Diagnosis present

## 2019-07-19 DIAGNOSIS — L0291 Cutaneous abscess, unspecified: Secondary | ICD-10-CM

## 2019-07-19 HISTORY — DX: Cutaneous abscess, unspecified: L02.91

## 2019-07-31 ENCOUNTER — Ambulatory Visit: Payer: Medicare Other | Attending: Internal Medicine

## 2019-07-31 DIAGNOSIS — Z23 Encounter for immunization: Secondary | ICD-10-CM | POA: Insufficient documentation

## 2019-07-31 NOTE — Progress Notes (Signed)
   Covid-19 Vaccination Clinic  Name:  Rhonda [name]    MRN: [id number] DOB: Dec 17, 1940  07/31/2019  [name] was observed post Covid-19 immunization for 15 minutes without incidence. She was provided with Vaccine Information Sheet and instruction to access the V-Safe system.   [name] was instructed to call 911 with any severe reactions post vaccine: Marland Kitchen Difficulty breathing  . Swelling of your face and throat  . A fast heartbeat  . A bad rash all over your body  . Dizziness and weakness    Immunizations Administered    Name Date Dose VIS Date Route   Pfizer COVID-19 Vaccine 07/31/2019  9:19 AM 0.3 mL 06/28/2019 Intramuscular   Manufacturer: [place]   Lot: [id number]   [place]: [id number]

## 2019-08-07 ENCOUNTER — Encounter: Payer: Self-pay | Admitting: Physical Therapy

## 2019-08-07 NOTE — Therapy (Signed)
[facility] [address] [place], [place], [address] Phone: [phone]   Fax:  [phone]  Patient Details  Name: Rhonda [name] MRN: [id number] Date of Birth: 12/10/1940 Referring Provider:  No ref. provider found  Encounter Date: 08/07/2019   PHYSICAL THERAPY DISCHARGE SUMMARY  Visits from Start of Care: 3  Current functional level related to goals / functional outcomes: Unable to assess; rest of patient's appointments cancelled last year due to clinic closing due to COVID.  When clinic reopened pt did not return for further therapy.   Remaining deficits: Impaired gait and balance   Education / Equipment: HEP  Plan: Patient agrees to discharge.  Patient goals were not met. Patient is being discharged due to not returning since the last visit.  ?????     Audra F Potter, PT, DPT 08/07/19    3:45 PM   [facility] [address] [place], [place], [address] Phone: [phone]   Fax:  [phone] 

## 2019-08-20 ENCOUNTER — Ambulatory Visit: Payer: Medicare Other | Attending: Internal Medicine

## 2019-08-20 DIAGNOSIS — Z23 Encounter for immunization: Secondary | ICD-10-CM | POA: Insufficient documentation

## 2019-08-20 NOTE — Progress Notes (Signed)
   Covid-19 Vaccination Clinic  Name:  Rhonda [name]    MRN: [id number] DOB: [birth year]  08/20/2019  [name] was observed post Covid-19 immunization for 15 minutes without incidence. She was provided with Vaccine Information Sheet and instruction to access the V-Safe system.   [name] was instructed to call 911 with any severe reactions post vaccine: Marland Kitchen Difficulty breathing  . Swelling of your face and throat  . A fast heartbeat  . A bad rash all over your body  . Dizziness and weakness    Immunizations Administered    Name Date Dose VIS Date Route   Pfizer COVID-19 Vaccine 08/20/2019  8:53 AM 0.3 mL 06/28/2019 Intramuscular   Manufacturer: [place]   Lot: [id number]   [place]: [id number]

## 2019-09-29 ENCOUNTER — Other Ambulatory Visit: Payer: Self-pay | Admitting: Cardiology

## 2019-09-30 NOTE — Telephone Encounter (Signed)
Pt last saw Dr Radford Pax 05/27/19, last labs 05/27/19 Creat 0.73, age 79, weight 89.4kg, based on specified criteria pt is on appropriate dosage of Eliquis 5mg  BID.  Will refill rx.

## 2019-10-16 ENCOUNTER — Other Ambulatory Visit: Payer: Self-pay

## 2019-10-16 ENCOUNTER — Encounter: Payer: Self-pay | Admitting: Neurology

## 2019-10-16 ENCOUNTER — Ambulatory Visit: Payer: Medicare Other | Admitting: Neurology

## 2019-10-16 ENCOUNTER — Telehealth: Payer: Self-pay | Admitting: Neurology

## 2019-10-16 VITALS — BP 160/70 | HR 86 | Temp 97.1°F | Ht 65.0 in | Wt 198.5 lb

## 2019-10-16 DIAGNOSIS — M6281 Muscle weakness (generalized): Secondary | ICD-10-CM | POA: Diagnosis not present

## 2019-10-16 DIAGNOSIS — E538 Deficiency of other specified B group vitamins: Secondary | ICD-10-CM

## 2019-10-16 DIAGNOSIS — R413 Other amnesia: Secondary | ICD-10-CM | POA: Diagnosis not present

## 2019-10-16 NOTE — Progress Notes (Signed)
Reason for visit: Orthostatic hypotension, muscle weakness, gait disturbance, memory disturbance, history of double vision  Referring physician: Dr. Adah Perl is a 79 y.o. female  History of present illness:  Ms. Gunnell is a 79 year old white female with a history of orthostatic hypotension that she claims was documented as early as 2011, but this has gradually worsened, particularly since January of 2021.  The patient has had a sensation of lightheadedness, and a sensation that she may faint when she stands up.  She recently was placed on Florinef which may have helped some.  She has a history of atrial fibrillation, she is on flecainide for this.  She has had documentation of at least a 20 point drop in blood pressure with standing, oftentimes more than this.  She does not have a corresponding increase in heart rate to accommodate the blood pressure drop, but again she does have atrial fibrillation.  The patient has never fallen or blacked out.  She reports that in January 2021, she also began noticing onset of painless weakness of the extremities, she has a lot of difficulty getting up out of a chair.  She does not use a cane for ambulation.  She also has noted some slight tremor mainly with the right arm that is noticeable before she gets up out of bed in the morning, she does not no tremor later on in the day.  In 2014, she had onset of double vision, she had a CT scan of the head, as far as I can see in the medical record she never had MRI evaluation.  She was seen through [facility], the etiology of the double vision was never determined, she uses prisms for this.  The double vision has been stable.  The patient does note some problems with urinary incontinence and urinary urgency, she has a lifelong history of constipation issues.  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  She denies any significant low back pain or pain down the legs.  She does have some slight right neck discomfort.  She denies pain down the arms.  She has had blood work done that revealed a normal CK enzyme level.  She has had an elevated sedimentation rate of 105 but the C-reactive protein was 1.  A TSH was 4.03.  She has a mild anemia with a hemoglobin of 11.9.  Chemistry panel and liver profile was unremarkable.  She is sent to this office for further evaluation.  Past Medical History:  Diagnosis Date  . Adenomatous colon polyp    cecum, last colonoscopy 2006  . Atrial tachycardia ([place])    non sustatined with PVCs and PACs  . Bacterial meningitis    as a child  . Basilar migraine   . Carotid stenosis, bilateral 10/13/2016   1-39% bilateral by dopplers 10/2016  . Cystocele    MIld  . History of hemorrhoids   . Hx of chest pain    Atypical  . Hx of hematuria    IVP 1990 left kidney stone, cystoscopy negative  . Hyperlipidemia   . Hyperlipidemia LDL goal <70 10/26/2017  . Hypertension   . Nephrolithiasis   . OSA (obstructive sleep apnea) 10/26/2017   Severe with AHI 38/hr.  Patient refuses PAP therapy  . Osteoporosis    actonel since  2006  . PAF (paroxysmal atrial fibrillation) ([place])    radiofrequency ablation of afib [facility]  . Right facial numbness 5/12   question TIA versus migraine,cardiac monitoring neg for AFIB  . Seizures ([place]) 1980   7 years on Dilantin  . Snoring   . SOB (shortness of breath)    Chronic mild, negative cardiolite  . Tinea versicolor     Past Surgical History:  Procedure Laterality Date  . multiple ankle surgeries      Family History  Problem Relation Age of Onset  . Parkinson's disease Mother   . Heart attack Father     Social history:  reports that she has never smoked. She has never used smokeless tobacco. She reports current alcohol use. She reports that she does not use  drugs.  Medications:  Prior to Admission medications   Medication Sig Start Date End Date Taking? Authorizing Provider  acetaminophen (TYLENOL) 325 MG tablet Take 650 mg by mouth every 6 (six) hours as needed for mild pain.   Yes [provider]  alendronate (FOSAMAX) 70 MG tablet Take 70 mg by mouth once a week. Take with a full glass of water on an empty stomach.   Yes [provider]  ALPRAZolam Duanne Moron) 0.5 MG tablet Take 0.5 mg by mouth at bedtime as needed for anxiety. USES FOR SLEEP   Yes [provider]  apixaban (ELIQUIS) 5 MG TABS tablet TAKE 1 TABLET BY MOUTH TWO  TIMES DAILY 04/10/18  Yes [provider]  Ascorbic Acid (VITAMIN C PO) Take 1 tablet by mouth daily.   Yes [provider]  CALCIUM CITRATE PO Take by mouth.   Yes [provider]  Cholecalciferol (VITAMIN D3 PO) Take 625 mg by mouth daily.   Yes [provider]  diltiazem (CARDIZEM) 30 MG tablet Take 30 mg by mouth every 4 (four) hours as needed (afib HR >100).   Yes [provider]  flecainide (TAMBOCOR) 50 MG tablet Take 1 tablet (50 mg total) by mouth 2 (two) times daily. 05/27/19  Yes Turner, Eber Hong, MD  Loratadine (CLARITIN PO) Take by mouth.   Yes [provider]  Meth-Hyo-M Bl-Na Phos-Ph Sal (URIBEL) 118 MG CAPS Take 118 mg by mouth daily. 10/11/18  Yes [provider]  omeprazole (PRILOSEC OTC) 20 MG tablet Take 20 mg by mouth as needed.   Yes [provider]  Sennosides (SENOKOT PO) Take by mouth.   Yes [provider]      Allergies  Allergen Reactions  . Erythromycin Other (See Comments)    Yeast infection    ROS:  Out of a complete 14 system review of symptoms, the patient complains only of the following symptoms, and all other reviewed systems are negative.  Weakness Word finding problems Double vision Walking difficulty Dizziness  Blood pressure (!) 160/70, pulse 86, temperature (!) 97.1 F  (36.2 C), height 5\' 5"  (1.651 m), weight 198 lb 8 oz (90 kg).   Blood pressure, right arm, sitting was 123456 systolic.  Blood pressure, right arm, standing is 0000000 systolic.   Physical Exam  General: The patient is alert and cooperative at the time of the examination.  The patient is moderately obese.  Eyes: Pupils are equal, round, and reactive to light. Discs are flat bilaterally.  Neck: The neck is supple, no carotid bruits are noted.  Respiratory: The respiratory examination is clear.  Cardiovascular: The cardiovascular examination reveals a regular rate and rhythm, no obvious  murmurs or rubs are noted.  Skin: Extremities are with 1-2+ edema below the knees bilaterally.  The patient has fusion of the right ankle.  Neurologic Exam  Mental status: The patient is alert and oriented x 3 at the time of the examination. The patient has apparent normal recent and remote memory, with an apparently normal attention span and concentration ability.  Cranial nerves: Facial symmetry is present. There is good sensation of the face to pinprick and soft touch bilaterally. The strength of the facial muscles and the muscles to head turning and shoulder shrug are normal bilaterally. Speech is well enunciated, no aphasia or dysarthria is noted. Extraocular movements are full. Visual fields are full. The tongue is midline, and the patient has symmetric elevation of the soft palate. No obvious hearing deficits are noted.  Motor: The motor testing reveals 5 over 5 strength of the upper extremities, with exception of slight weakness of the triceps muscles bilaterally and deltoid muscles bilaterally in a symmetric fashion.  With the lower extremities, there is 4/5 strength with hip flexion bilaterally but otherwise she has good strength in the legs. Good symmetric motor tone is noted throughout.  Sensory: Sensory testing is intact to pinprick, soft touch, vibration sensation, and position sense on all 4  extremities. No evidence of extinction is noted.  Coordination: Cerebellar testing reveals good finger-nose-finger and heel-to-shin bilaterally.  Gait and station: Gait is minimally wide-based, the patient has a slight decrease in arm swing on the right as compared to the left.  Tandem gait was not attempted.  Romberg is negative.  Reflexes: Deep tendon reflexes are symmetric and normal bilaterally, with exception of the depression of the right ankle jerk reflex, the right ankle is fused.  The left ankle jerk reflex is well-maintained. Toes are downgoing bilaterally.   Assessment/Plan:  1.  Reported word finding/memory disturbance  2.  Orthostatic hypotension  3.  Mild generalized muscle weakness, proximal  4.  History of double vision  5.  Gait disorder  6.  Reported right upper extremity tremor  The patient's mother had Parkinson's disease, but I do not see evidence of this on the clinical examination today.  The patient has a complex history with longstanding orthostatic hypotension that has worsened recently, she does have mild proximal muscle weakness, she reports recent onset of some word finding problems and cognitive decline, and she has had a longstanding history of double vision.  The patient will be set up for MRI of the brain.  She will have blood work done today.  Nerve conduction studies will be done on both legs and 1 arm, EMG on 1 arm and 1 leg.  She will follow-up in 4 months.  Jill Alexanders MD 10/16/2019 8:40 AM  [facility] [address] [place], [place] [address]  Phone [phone] Fax [phone]

## 2019-10-16 NOTE — Telephone Encounter (Signed)
[employer] medicare order sent to GI. No auth they will reach out to the patient to schedule.  

## 2019-10-22 LAB — ANA W/REFLEX: Anti Nuclear Antibody (ANA): NEGATIVE

## 2019-10-22 LAB — LYME, WESTERN BLOT, SERUM (REFLEXED)
IgG P18 Ab.: ABSENT
IgG P23 Ab.: ABSENT
IgG P28 Ab.: ABSENT
IgG P30 Ab.: ABSENT
IgG P39 Ab.: ABSENT
IgG P41 Ab.: ABSENT
IgG P45 Ab.: ABSENT
IgG P58 Ab.: ABSENT
IgG P66 Ab.: ABSENT
IgG P93 Ab.: ABSENT
IgM P23 Ab.: ABSENT
IgM P39 Ab.: ABSENT
IgM P41 Ab.: ABSENT
Lyme IgG Wb: NEGATIVE
Lyme IgM Wb: NEGATIVE

## 2019-10-22 LAB — B. BURGDORFI ANTIBODIES: Lyme IgG/IgM Ab: 0.93 {ISR} — ABNORMAL HIGH (ref 0.00–0.90)

## 2019-10-22 LAB — ANGIOTENSIN CONVERTING ENZYME: Angio Convert Enzyme: 25 U/L (ref 14–82)

## 2019-10-22 LAB — ACETYLCHOLINE RECEPTOR, BINDING: AChR Binding Ab, Serum: 0.06 nmol/L (ref 0.00–0.24)

## 2019-10-22 LAB — VITAMIN B12: Vitamin B-12: 180 pg/mL — ABNORMAL LOW (ref 232–1245)

## 2019-10-22 LAB — VGCC ANTIBODY: VGCC Antibody: NEGATIVE

## 2019-10-22 LAB — GLUTAMIC ACID DECARBOXYLASE AUTO ABS: Glutamic Acid Decarb Ab: <5 U/mL (ref 0.0–5.0)

## 2019-10-22 LAB — SEDIMENTATION RATE: Sed Rate: 60 mm/hr — ABNORMAL HIGH (ref 0–40)

## 2019-10-23 ENCOUNTER — Telehealth: Payer: Self-pay | Admitting: Neurology

## 2019-10-23 NOTE — Telephone Encounter (Signed)
I called the patient.  The blood work shows a low vitamin B12 level, I will have her come in for a shot and then she will go in and get 1000 mcg tablets and take 1 daily.  The patient had a positive Lyme antibody but the Western blot was completely negative.  Otherwise, blood work was unremarkable.  The sedimentation rate it was 60, down from 105.  Her C-reactive protein previously was 1.

## 2019-10-24 ENCOUNTER — Other Ambulatory Visit (INDEPENDENT_AMBULATORY_CARE_PROVIDER_SITE_OTHER): Payer: Medicare Other

## 2019-10-24 DIAGNOSIS — E538 Deficiency of other specified B group vitamins: Secondary | ICD-10-CM

## 2019-10-24 MED ORDER — CYANOCOBALAMIN 1000 MCG/ML IJ SOLN
1000.0000 ug | Freq: Once | INTRAMUSCULAR | Status: AC
  Administered 2019-10-28: 1000 ug via INTRAMUSCULAR

## 2019-10-24 NOTE — Telephone Encounter (Signed)
I called pt that Dr. Jannifer Franklin wants her to come in for a  b12 injection and than take vitamin b2 1029mcg one pill daily over the counter.Pt schedule for 10/28/2019 at 130pm.

## 2019-10-28 ENCOUNTER — Ambulatory Visit: Payer: Medicare Other

## 2019-10-28 DIAGNOSIS — E538 Deficiency of other specified B group vitamins: Secondary | ICD-10-CM | POA: Diagnosis not present

## 2019-10-28 MED ORDER — CYANOCOBALAMIN 1000 MCG/ML IJ SOLN: 1000.0000 ug | Freq: Once | INTRAMUSCULAR | Status: AC

## 2019-10-28 NOTE — Addendum Note (Signed)
Addended by: Marval Regal on: 10/28/2019 01:42 PM   Modules accepted: Orders

## 2019-10-28 NOTE — Progress Notes (Addendum)
B12 given in left deltoid. Pt tolerated well and band aid applied. B!2 was given on October 28, 2019.

## 2019-11-08 ENCOUNTER — Telehealth: Payer: Self-pay | Admitting: Neurology

## 2019-11-08 NOTE — Telephone Encounter (Signed)
Pt has called to relay she has not been contacted by anyone re: the scheduling of her CT Scan, pt asked this message be sent to the RN

## 2019-11-11 NOTE — Telephone Encounter (Signed)
I called pt that Dr. Jannifer Franklin order a MRI for her and its schedule tomorrow. PT will be getting her MRI tor morrow. She stated Dr. Jannifer Franklin mention a Ct and MRI. I stated both cannot be order at the same time and he order a MRI. PT appreciate the call and clarification  and verbalized understanding.Rhonda [name]

## 2019-11-12 ENCOUNTER — Ambulatory Visit
Admission: RE | Admit: 2019-11-12 | Discharge: 2019-11-12 | Disposition: A | Discharge status: Home / Self Care | Payer: Medicare Other | Source: Ambulatory Visit | Attending: Neurology | Admitting: Neurology

## 2019-11-12 DIAGNOSIS — M6281 Muscle weakness (generalized): Secondary | ICD-10-CM | POA: Diagnosis not present

## 2019-11-12 DIAGNOSIS — R413 Other amnesia: Secondary | ICD-10-CM

## 2019-11-13 ENCOUNTER — Ambulatory Visit (INDEPENDENT_AMBULATORY_CARE_PROVIDER_SITE_OTHER): Payer: Medicare Other | Admitting: Neurology

## 2019-11-13 ENCOUNTER — Other Ambulatory Visit: Payer: Self-pay

## 2019-11-13 ENCOUNTER — Encounter: Payer: Self-pay | Admitting: Neurology

## 2019-11-13 ENCOUNTER — Ambulatory Visit: Payer: Medicare Other | Admitting: Neurology

## 2019-11-13 ENCOUNTER — Telehealth: Payer: Self-pay | Admitting: Neurology

## 2019-11-13 DIAGNOSIS — I951 Orthostatic hypotension: Secondary | ICD-10-CM

## 2019-11-13 DIAGNOSIS — M6281 Muscle weakness (generalized): Secondary | ICD-10-CM | POA: Diagnosis not present

## 2019-11-13 NOTE — Procedures (Signed)
     HISTORY:  Rhonda [name] is a 79 year old patient with a history of some proximal muscle weakness of all 4 extremities.  The patient has difficulty arising from a seated position.  She has some weakness proximally as well in the arms.  She is being evaluated for possible myopathic disorder.  NERVE CONDUCTION STUDIES:  Nerve conduction studies were performed on the right upper extremity.  The distal motor latency and motor amplitudes for the right ulnar nerve were normal with normal nerve conduction velocities seen for this nerve.  The right ulnar sensory latency was normal.  The right ulnar F-wave latency was normal.  Nerve conduction studies were performed on both lower extremities.  The distal motor latencies and motor amplitudes for the peroneal and posterior tibial nerves were within normal limits bilaterally with normal nerve conduction velocities seen for these nerves bilaterally as well.  The sural sensory latencies were normal bilaterally but the peroneal sensory latencies were unobtainable bilaterally.  The F-wave latencies for the posterior tibial nerves were normal bilaterally.  EMG STUDIES:  A limited EMG was performed on the right upper extremity. The biceps muscle reveals 1-2 K units with full recruitment.  No fibrillations or positive waves were noted. The triceps muscle reveals 1-3 K units with full recruitment.  No fibrillations or positive waves were noted. The anterior deltoid muscle reveals 1-3 K units with full recruitment.  No fibrillations or positive waves were noted.  EMG study was performed on the righ lower extremity:  The tibialis anterior muscle reveals 2 to 4K motor units with decreased recruitment. No fibrillations or positive waves were seen. The peroneus tertius muscle reveals 2 to 4K motor units with dereased recruitment. No fibrillations or positive waves were seen. The medial gastrocnemius muscle reveals 1 to 3K motor units with full recruitment. No  fibrillations or positive waves were seen. The vastus lateralis muscle reveals 2 to 4K motor units with full recruitment. No fibrillations or positive waves were seen. The iliopsoas muscle reveals 2 to 4K motor units with full recruitment. No fibrillations or positive waves were seen. The biceps femoris muscle (long head) reveals 2 to 4K motor units with full recruitment. No fibrillations or positive waves were seen. The lumbosacral paraspinal muscles were tested at 3 levels, and revealed no abnormalities of insertional activity at all 3 levels tested. There was poor relaxation.   Of note is that patient has partial fusion of the right ankle, cannot activate peroneal nerve innervated muscles well for this reason.   IMPRESSION:  Nerve conduction studies done on the lower extremities bilaterally and on the right upper extremity shows some mild sensory changes that could be consistent with an early/sensory peripheral neuropathy.  EMG evaluation of the right upper and right lower extremities do not show findings suggestive of a myopathic disorder.  There is no evidence of an overlying right lumbosacral radiculopathy.  Jill Alexanders MD 11/13/2019 3:24 PM  [facility] [address] [place], [place] [address]  Phone [phone] Fax [phone]

## 2019-11-13 NOTE — Progress Notes (Signed)
Please refer to EMG and nerve conduction procedure note.  

## 2019-11-13 NOTE — Progress Notes (Addendum)
The patient comes in for EMG and nerve conduction study today.  The nerve conductions appear to be consistent with a mild primarily sensory peripheral neuropathy, EMG evaluation on the right arm and right leg does not clearly show evidence of a myopathic disorder.  The patient was being evaluated for proximal muscle weakness.  The patient may have some degree of deconditioning, I have recommended that she start getting into some regular physical exercise, if she wishes to have a physical therapy evaluation I will get this set up.  MRI of the brain was done, by my reading it appears to show minimal white matter changes, overall relatively unremarkable.  I will contact the patient if the formal report is different from what I have told her.       [place]    Nerve / Sites Muscle Latency Ref. Amplitude Ref. Rel Amp Segments Distance Velocity Ref. Area    ms ms mV mV %  cm m/s m/s mVms  L Ulnar - ADM     Wrist ADM 3.0 ?3.3 10.4 ?6.0 100 Wrist - ADM 7   37.2     B.Elbow ADM 6.0  9.7  92.5 B.Elbow - Wrist 20 67 ?49 36.4     A.Elbow ADM 7.6  9.9  103 A.Elbow - B.Elbow 10 63 ?49 35.9         A.Elbow - Wrist      R Peroneal - EDB     Ankle EDB 4.4 ?6.5 2.8 ?2.0 100 Ankle - EDB 9   10.9     Fib head EDB 10.6  2.4  86 Fib head - Ankle 30 48 ?44 8.5     Pop fossa EDB 12.7  2.9  121 Pop fossa - Fib head 10 47 ?44 10.5         Pop fossa - Ankle      L Peroneal - EDB     Ankle EDB 4.0 ?6.5 2.0 ?2.0 100 Ankle - EDB 9   7.4     Fib head EDB 10.9  1.7  86.6 Fib head - Ankle 30 44 ?44 7.0     Pop fossa EDB 13.2  1.8  105 Pop fossa - Fib head 10 44 ?44 6.4         Pop fossa - Ankle      R Tibial - AH     Ankle AH 4.3 ?5.8 7.2 ?4.0 100 Ankle - AH 9   11.7     Pop fossa AH 13.5  5.3  73.7 Pop fossa - Ankle 40 43 ?41 13.0  L Tibial - AH     Ankle AH 3.5 ?5.8 4.0 ?4.0 100 Ankle - AH 9   10.1     Pop fossa AH 13.1  3.1  77.9 Pop fossa - Ankle 40 41 ?41 11.7                SNC    Nerve / Sites Rec. Site  Peak Lat Ref.  Amp Ref. Segments Distance    ms ms V V  cm  R Sural - Ankle (Calf)     Calf Ankle 3.6 ?4.4 3 ?6 Calf - Ankle 14  L Sural - Ankle (Calf)     Calf Ankle 4.3 ?4.4 1 ?6 Calf - Ankle 14  R Superficial peroneal - Ankle     Lat leg Ankle NR ?4.4 NR ?6 Lat leg - Ankle 14  L Superficial peroneal - Ankle  Lat leg Ankle NR ?4.4 NR ?6 Lat leg - Ankle 14  L Ulnar - Orthodromic, (Dig V, Mid palm)     Dig V Wrist 2.9 ?3.1 8 ?5 Dig V - Wrist 79               F  Wave    Nerve F Lat Ref.   ms ms  R Tibial - AH 55.0 ?56.0  L Tibial - AH 55.7 ?56.0  L Ulnar - ADM 28.6 ?32.0

## 2019-11-13 NOTE — Telephone Encounter (Signed)
The patient was in today for EMG nerve conduction study, indicated to her that the MRI of the brain was unremarkable by my reading, this is supported by the formal report.   MRI brain 11/13/19:  IMPRESSION:   Unremarkable MRI brain (without). No acute findings.

## 2019-12-21 ENCOUNTER — Other Ambulatory Visit: Payer: Self-pay | Admitting: Cardiology

## 2019-12-21 DIAGNOSIS — G4733 Obstructive sleep apnea (adult) (pediatric): Secondary | ICD-10-CM

## 2019-12-21 DIAGNOSIS — I6523 Occlusion and stenosis of bilateral carotid arteries: Secondary | ICD-10-CM

## 2019-12-21 DIAGNOSIS — I10 Essential (primary) hypertension: Secondary | ICD-10-CM

## 2019-12-21 DIAGNOSIS — I4819 Other persistent atrial fibrillation: Secondary | ICD-10-CM

## 2019-12-21 DIAGNOSIS — E785 Hyperlipidemia, unspecified: Secondary | ICD-10-CM

## 2020-02-13 ENCOUNTER — Encounter: Payer: Self-pay | Admitting: Cardiology

## 2020-02-13 ENCOUNTER — Other Ambulatory Visit: Payer: Self-pay

## 2020-02-13 ENCOUNTER — Ambulatory Visit: Payer: Medicare Other | Admitting: Cardiology

## 2020-02-13 VITALS — BP 136/84 | HR 63 | Ht 65.0 in | Wt 196.0 lb

## 2020-02-13 DIAGNOSIS — R0602 Shortness of breath: Secondary | ICD-10-CM

## 2020-02-13 DIAGNOSIS — I6523 Occlusion and stenosis of bilateral carotid arteries: Secondary | ICD-10-CM | POA: Diagnosis not present

## 2020-02-13 DIAGNOSIS — I4819 Other persistent atrial fibrillation: Secondary | ICD-10-CM | POA: Diagnosis not present

## 2020-02-13 DIAGNOSIS — I10 Essential (primary) hypertension: Secondary | ICD-10-CM

## 2020-02-13 DIAGNOSIS — E785 Hyperlipidemia, unspecified: Secondary | ICD-10-CM

## 2020-02-13 NOTE — Patient Instructions (Addendum)
Medication Instructions:  Your physician recommends that you continue on your current medications as directed. Please refer to the Current Medication list given to you today.  *If you need a refill on your cardiac medications before your next appointment, please call your pharmacy*   Lab Work: Fasting lipids, CMET, CBC If you have labs (blood work) drawn today and your tests are completely normal, you will receive your results only by: Marland Kitchen MyChart Message (if you have MyChart) OR . A paper copy in the mail If you have any lab test that is abnormal or we need to change your treatment, we will call you to review the results.  Follow-Up: At [facility], you and your health needs are our priority.  As part of our continuing mission to provide you with exceptional heart care, we have created designated Provider Care Teams.  These Care Teams include your primary Cardiologist (physician) and Advanced Practice Providers (APPs -  Physician Assistants and Nurse Practitioners) who all work together to provide you with the care you need, when you need it.  Your next appointment:   4 weeks  The format for your next appointment:   In Person  Provider:   Melina Copa, PA-C or [name], PA-C  Follow up with Dr. Radford Pax in 1 year

## 2020-02-13 NOTE — Progress Notes (Signed)
Cardiology Office Note:    Date:  02/13/2020   ID:  Rhonda [name], DOB [birth year], MRN [id number]  PCP:  Lavone Orn, MD  Cardiologist:  Fransico Him, MD    Referring MD: Lavone Orn, MD   Chief Complaint  Patient presents with   Atrial Fibrillation   Hypertension   Hyperlipidemia   Shortness of Breath    History of Present Illness:    Rhonda [name] is a 79 y.o. female with a hx of persistentAF s/p afib ablation12 years ago, bilateral carotid stenosis <50%, PVC's, PAC's and nonsustained atrial tachycardia.She has been followed in afib clinic with Dr. Rayann Heman.. Repeat A. fib ablation versus AAD therapy werediscussed with the patient and she was adamant she did not want to consider ablation or any otherdrugs.She has [place] score of 4and is on Eliquis for anticoagulation.    She underwent home sleep study showing severe sleep apnea with an AHI of 38.1/h. She underwent CPAP titration but could not be adequately titrated due to ongoing respiratory events. A BiPAP titration was recommended but patient refused any further studies and stated that she was not going to use a Pap device.  She went back to see Dr. Ola Spurr who had done her original ablation and she had a stress test and Ziopatch.  The stress test was fine and he placed her on Flecainide.    She is here today for followup and is doing well from a cardiac standpoint.  She has had some problems with leg weakness and problems talking and was referred to Neuro and head MRI was normal and she was told she does not have Parkinson's.  She is worried that it might be related to COVID 19.   She denies any chest pain or pressure, PND, orthopnea, LE edema,  palpitations or syncope. She continues to have leg weakness and finds that she has problems with lifting her legs up over a curb when walking.  She also has been having problems with orthostatic hypotension and dizziness when going from sitting to standing and was  placed on Florinef 0.1mg  daily which has helped her symptoms but she still has some problems when she gets lightheaded after standing for too long and feels like she is going to pass out.  She continues to have DOE felt to be deconditioning in the past and sedentary state and has not tried to get into an exercise program.     Past Medical History:  Diagnosis Date   Adenomatous colon polyp    cecum, last colonoscopy 2006   Atrial tachycardia ([place])    non sustatined with PVCs and PACs   Bacterial meningitis    as a child   Basilar migraine    Carotid stenosis, bilateral 10/13/2016   1-39% bilateral by dopplers 10/2016   Cystocele    MIld   History of hemorrhoids    Hx of chest pain    Atypical   Hx of hematuria    IVP 1990 left kidney stone, cystoscopy negative   Hyperlipidemia    Hyperlipidemia LDL goal <70 10/26/2017   Hypertension    Nephrolithiasis    OSA (obstructive sleep apnea) 10/26/2017   Severe with AHI 38/hr.  Patient refuses PAP therapy   Osteoporosis    actonel since 2006   PAF (paroxysmal atrial fibrillation) ([place])    radiofrequency ablation of afib [facility]   Right facial numbness 5/12   question TIA versus migraine,cardiac monitoring neg for AFIB   Seizures ([place]) 1980  7 years on Dilantin   Snoring    SOB (shortness of breath)    Chronic mild, negative cardiolite   Tinea versicolor     Past Surgical History:  Procedure Laterality Date   multiple ankle surgeries      Current Medications: Current Meds  Medication Sig   acetaminophen (TYLENOL) 325 MG tablet Take 650 mg by mouth every 6 (six) hours as needed for mild pain.   alendronate (FOSAMAX) 70 MG tablet Take 70 mg by mouth once a week. Take with a full glass of water on an empty stomach.   ALPRAZolam (XANAX) 0.5 MG tablet Take 0.5 mg by mouth at bedtime as needed for anxiety. USES FOR SLEEP   apixaban (ELIQUIS) 5 MG TABS tablet TAKE 1 TABLET BY MOUTH TWO  TIMES DAILY    Ascorbic Acid (VITAMIN C PO) Take 1 tablet by mouth daily.   Cholecalciferol (VITAMIN D3 PO) Take 625 mg by mouth daily.   diltiazem (CARDIZEM) 30 MG tablet Take 30 mg by mouth every 4 (four) hours as needed (afib HR >100).   flecainide (TAMBOCOR) 50 MG tablet TAKE 1 TABLET BY MOUTH  TWICE DAILY   fludrocortisone (FLORINEF) 0.1 MG tablet Take 100 mcg by mouth daily.   Loratadine (CLARITIN PO) Take 1 tablet by mouth daily as needed (allergies).    omeprazole (PRILOSEC OTC) 20 MG tablet Take 20 mg by mouth as needed.   Sennosides (SENOKOT PO) Take 1 tablet by mouth daily as needed.    vitamin B-12 (CYANOCOBALAMIN) 1000 MCG tablet Take 1,000 mcg by mouth daily.   Current Facility-Administered Medications for the 02/13/20 encounter (Office Visit) with Sueanne Margarita, MD  Medication   cyanocobalamin ((VITAMIN B-12)) injection 1,000 mcg     Allergies:   Erythromycin   Social History   Socioeconomic History   Marital status: Married    Spouse name: Not on file   Number of children: Not on file   Years of education: Not on file   Highest education level: Not on file  Occupational History   Not on file  Tobacco Use   Smoking status: Never Smoker   Smokeless tobacco: Never Used  Vaping Use   Vaping Use: Never used  Substance and Sexual Activity   Alcohol use: Yes    Comment: 1-2 glasses of wine weekly   Drug use: No   Sexual activity: Not on file  Other Topics Concern   Not on file  Social History Narrative   Not on file   Social Determinants of Health   Financial Resource Strain:    Difficulty of Paying Living Expenses:   Food Insecurity:    Worried About Charity fundraiser in the Last Year:    Arboriculturist in the Last Year:   Transportation Needs:    Film/video editor (Medical):    Lack of Transportation (Non-Medical):   Physical Activity:    Days of Exercise per Week:    Minutes of Exercise per Session:   Stress:    Feeling of  Stress :   Social Connections:    Frequency of Communication with Friends and Family:    Frequency of Social Gatherings with Friends and Family:    Attends Religious Services:    Active Member of Clubs or Organizations:    Attends Archivist Meetings:    Marital Status:      Family History: The patient's family history includes Heart attack in her father; Parkinson's disease in  her mother.  ROS:   Please see the history of present illness.    ROS  All other systems reviewed and negative.   EKGs/Labs/Other Studies Reviewed:    The following studies were reviewed today: none  EKG:  EKG is notordered today.    Recent Labs: 05/27/2019: BUN 18; Creatinine, Ser 0.73; Hemoglobin 11.9; Platelets 220; Potassium 4.5; Sodium 143   Recent Lipid Panel No results found for: CHOL, TRIG, HDL, CHOLHDL, VLDL, LDLCALC, LDLDIRECT  Physical Exam:    VS:  BP (!) 136/84 (BP Location: Left Arm, Patient Position: Sitting, Cuff Size: Normal)    Pulse 63    Ht 5\' 5"  (1.651 m)    Wt 196 lb (88.9 kg)    SpO2 96%    BMI 32.62 kg/m     No data found.   Wt Readings from Last 3 Encounters:  02/13/20 196 lb (88.9 kg)  10/16/19 198 lb 8 oz (90 kg)  05/27/19 197 lb (89.4 kg)     GEN: Well nourished, well developed in no acute distress HEENT: Normal NECK: No JVD; No carotid bruits LYMPHATICS: No lymphadenopathy CARDIAC:RRR, no murmurs, rubs, gallops RESPIRATORY:  Clear to auscultation without rales, wheezing or rhonchi  ABDOMEN: Soft, non-tender, non-distended MUSCULOSKELETAL:  No edema; No deformity  SKIN: Warm and dry NEUROLOGIC:  Alert and oriented x 3 PSYCHIATRIC:  Normal affect   EKG was done in the office today showing NSR at 63bpm with no ST changes  ASSESSMENT:    1. Persistent atrial fibrillation ([place])   2. Benign essential HTN   3. Carotid stenosis, bilateral   4. Hyperlipidemia LDL goal <70   5. SOB (shortness of breath)    PLAN:    In order of problems  listed above:  1.  Persistent atrial fibrillation -s/p afib ablation 12 years ago -developed recurrent PAF and was not interested in pursuing repeat ablation -seen remotely by Dr. Ola Spurr and started on Flecainide 50mg  BID -she had a nuclear stress test prior to initiating flecainide that showed no ischemia -she has not had any repeat ETT done on flecainide to rule out exercise induced arrhythmias because she is not able to walk on a treadmill. -she has not had any further palpitations or bleeding problems on the DOAC -continue Flecainide 50mg  BID, Eilquis 5mg  BID -continue Cardizem PRN for breakthrough palpitations -check BMET and Hbg  2.  HTN -BP controlled on exam today -she has not required any antihypertensive meds  3.  Bilateral carotid artery stenosis -dopplers 01/2019 showed 1-39% left carotid stenosis  -repeat dopplers 01/2021 -No ASA due to DOAC -statin intolerant  4.  OSA -PSG showed severe OSA but did not tolerate CPAP due to ongoing respiratory events and refused BiPAP  5.  HLD -LDL goal < 70 due to carotid disease -LDL was 126 on 08/16/2017 -statin intolerant -repeat FLP and if LDL > 70 refer to lipid clinic  6.  DOE -felt related to deconditioning and sedentary lifestyle -2D echo 05/2019 was normal except for G1DD  7.  Orthostatic Hypotension -she has had problems recently with getting dizzy and near syncopal when standing for too long of a period of time -Started on Florinef 0.1mg  daily by Dr. Laurann Montana -SBP in the 150's so would avoid addition of Proamatime -I have given her a Rx for thigh high compression hose as well as abdominal binder and instructed her to wear them daily -I will have her see my PA back in a few weeks to see how she  is doing   Medication Adjustments/Labs and Tests Ordered: Current medicines are reviewed at length with the patient today.  Concerns regarding medicines are outlined above.  Orders Placed This Encounter  Procedures   EKG  12-Lead   No orders of the defined types were placed in this encounter.   Signed, Fransico Him, MD  02/13/2020 4:26 PM    [place]

## 2020-02-17 ENCOUNTER — Other Ambulatory Visit: Payer: Self-pay

## 2020-02-17 ENCOUNTER — Other Ambulatory Visit: Payer: Medicare Other

## 2020-02-17 DIAGNOSIS — I10 Essential (primary) hypertension: Secondary | ICD-10-CM

## 2020-02-17 DIAGNOSIS — E785 Hyperlipidemia, unspecified: Secondary | ICD-10-CM

## 2020-02-17 DIAGNOSIS — I4819 Other persistent atrial fibrillation: Secondary | ICD-10-CM

## 2020-02-17 LAB — LIPID PANEL
Chol/HDL Ratio: 3 ratio (ref 0.0–4.4)
Cholesterol, Total: 193 mg/dL (ref 100–199)
HDL: 64 mg/dL (ref >39)
LDL Chol Calc (NIH): 109 mg/dL — ABNORMAL HIGH (ref 0–99)
Triglycerides: 113 mg/dL (ref 0–149)
VLDL Cholesterol Cal: 20 mg/dL (ref 5–40)

## 2020-02-17 LAB — COMPREHENSIVE METABOLIC PANEL
ALT: 9 IU/L (ref 0–32)
AST: 15 IU/L (ref 0–40)
Albumin/Globulin Ratio: 1.1 — ABNORMAL LOW (ref 1.2–2.2)
Albumin: 3.8 g/dL (ref 3.7–4.7)
Alkaline Phosphatase: 47 IU/L — ABNORMAL LOW (ref 48–121)
BUN/Creatinine Ratio: 14 (ref 12–28)
BUN: 10 mg/dL (ref 8–27)
Bilirubin Total: 0.3 mg/dL (ref 0.0–1.2)
CO2: 23 mmol/L (ref 20–29)
Calcium: 9 mg/dL (ref 8.7–10.3)
Chloride: 107 mmol/L — ABNORMAL HIGH (ref 96–106)
Creatinine, Ser: 0.74 mg/dL (ref 0.57–1.00)
GFR calc Af Amer: 90 mL/min/{1.73_m2} (ref >59)
GFR calc non Af Amer: 78 mL/min/{1.73_m2} (ref >59)
Globulin, Total: 3.5 g/dL (ref 1.5–4.5)
Glucose: 87 mg/dL (ref 65–99)
Potassium: 4.3 mmol/L (ref 3.5–5.2)
Sodium: 140 mmol/L (ref 134–144)
Total Protein: 7.3 g/dL (ref 6.0–8.5)

## 2020-02-17 LAB — CBC
Hematocrit: 36.9 % (ref 34.0–46.6)
Hemoglobin: 12.3 g/dL (ref 11.1–15.9)
MCH: 30.5 pg (ref 26.6–33.0)
MCHC: 33.3 g/dL (ref 31.5–35.7)
MCV: 92 fL (ref 79–97)
Platelets: 229 10*3/uL (ref 150–450)
RBC: 4.03 x10E6/uL (ref 3.77–5.28)
RDW: 12.8 % (ref 11.7–15.4)
WBC: 5.2 10*3/uL (ref 3.4–10.8)

## 2020-02-18 ENCOUNTER — Telehealth: Payer: Self-pay

## 2020-02-18 DIAGNOSIS — E785 Hyperlipidemia, unspecified: Secondary | ICD-10-CM

## 2020-02-18 NOTE — Telephone Encounter (Signed)
Sueanne Margarita, MD  02/17/2020 5:38 PM EDT     Would like to see LDL < 70. Please refer to lipid clinic   The patient has been notified of the result and verbalized understanding.  All questions (if any) were answered. Antonieta Iba, RN 02/18/2020 3:56 PM  Referral to lipid clinic has been placed.

## 2020-02-18 NOTE — Telephone Encounter (Signed)
-----   Message from Sueanne Margarita, MD sent at 02/17/2020  4:57 PM EDT ----- Stable labs - continue current meds and forward to PCP

## 2020-02-21 ENCOUNTER — Other Ambulatory Visit: Payer: Self-pay

## 2020-02-21 ENCOUNTER — Ambulatory Visit (INDEPENDENT_AMBULATORY_CARE_PROVIDER_SITE_OTHER): Payer: Medicare Other | Admitting: Pharmacist

## 2020-02-21 DIAGNOSIS — G720 Drug-induced myopathy: Secondary | ICD-10-CM | POA: Diagnosis not present

## 2020-02-21 DIAGNOSIS — E785 Hyperlipidemia, unspecified: Secondary | ICD-10-CM

## 2020-02-21 DIAGNOSIS — T466X5A Adverse effect of antihyperlipidemic and antiarteriosclerotic drugs, initial encounter: Secondary | ICD-10-CM | POA: Insufficient documentation

## 2020-02-21 MED ORDER — ROSUVASTATIN CALCIUM 5 MG PO TABS
ORAL_TABLET | ORAL | 11 refills | Status: DC
Start: 2020-02-21 — End: 2020-05-20

## 2020-02-21 NOTE — Progress Notes (Signed)
Patient ID: Rhonda [name]                 DOB: 09/25/1940                    MRN: [id number]     HPI: Rhonda [name] is a 79 y.o. female patient referred to lipid clinic by Dr Radford Pax. PMH is significant for persistent AF s/p ablation 12 years ago, bilateral carotid stenosis < 50% in 2015, 1-39% left ICA stenosis in 01/2019, PVCs, PACs, nonsustained atrial tachycardia, orthostatic hypotension on Florinef, and OSA. She saw neuro for leg weakness and problems talking, MRI was normal and she was told she did not have Parkinson's. 10 year ASCVD risk is 22%. She has a history of statin intolerance and was referred to lipid clinic for further management.  Pt reports taking pravastatin a few years ago which caused muscle aches. She is still experiencing muscle weakness in her legs that started about 6 months ago, she does not have a diagnosis for this yet. She also continues to experience orthostatic hypotension. Both of these have unfortunately limited her activity (previously worked in her garden). Cholesterol has not been a main focus for her with everything else going on. Reports she has gained about 10 lbs in the past year and would like to lose some weight.  Current Medications: none Intolerances: pravastatin 40mg  daily - muscle aches Risk Factors: carotid artery stenosis, age, family history, obesity LDL goal: 70mg /dL  Diet: Whole wheat english muffin with sugar free apple butter in the AM. Skips lunch or has a yogurt. Dinner is later in the evening - likes pasta, baked potatoes, doesn't eat fried food, tries to limit meat. Likes ice cream.  Exercise: Minimal due to leg weakness and orthostatic hypotension  Family History: MI in her father, Parkinson's in her mother.  Social History: Denies tobacco and illicit drug use. Occasional alcohol use.  Labs: 02/17/20: TC 193, TG 113, HDL 64, LDL 109 (no lipid lowering therapy)  Past Medical History:  Diagnosis Date  . Adenomatous colon polyp     cecum, last colonoscopy 2006  . Atrial tachycardia ([place])    non sustatined with PVCs and PACs  . Bacterial meningitis    as a child  . Basilar migraine   . Carotid stenosis, bilateral 10/13/2016   1-39% bilateral by dopplers 10/2016  . Cystocele    MIld  . History of hemorrhoids   . Hx of chest pain    Atypical  . Hx of hematuria    IVP 1990 left kidney stone, cystoscopy negative  . Hyperlipidemia   . Hyperlipidemia LDL goal <70 10/26/2017  . Hypertension   . Nephrolithiasis   . OSA (obstructive sleep apnea) 10/26/2017   Severe with AHI 38/hr.  Patient refuses PAP therapy  . Osteoporosis    actonel since 2006  . PAF (paroxysmal atrial fibrillation) ([place])    radiofrequency ablation of afib [facility]  . Right facial numbness 5/12   question TIA versus migraine,cardiac monitoring neg for AFIB  . Seizures ([place]) 1980   7 years on Dilantin  . Snoring   . SOB (shortness of breath)    Chronic mild, negative cardiolite  . Tinea versicolor     Current Outpatient Medications on File Prior to Visit  Medication Sig Dispense Refill  . acetaminophen (TYLENOL) 325 MG tablet Take 650 mg by mouth every 6 (six) hours as needed for mild pain.    Marland Kitchen alendronate (FOSAMAX) 70  MG tablet Take 70 mg by mouth once a week. Take with a full glass of water on an empty stomach.    . ALPRAZolam (XANAX) 0.5 MG tablet Take 0.5 mg by mouth at bedtime as needed for anxiety. USES FOR SLEEP    . apixaban (ELIQUIS) 5 MG TABS tablet TAKE 1 TABLET BY MOUTH TWO  TIMES DAILY    . Ascorbic Acid (VITAMIN C PO) Take 1 tablet by mouth daily.    . Cholecalciferol (VITAMIN D3 PO) Take 625 mg by mouth daily.    Marland Kitchen diltiazem (CARDIZEM) 30 MG tablet Take 30 mg by mouth every 4 (four) hours as needed (afib HR >100).    . flecainide (TAMBOCOR) 50 MG tablet TAKE 1 TABLET BY MOUTH  TWICE DAILY 180 tablet 1  . fludrocortisone (FLORINEF) 0.1 MG tablet Take 100 mcg by mouth daily.    . Loratadine (CLARITIN PO) Take 1 tablet by  mouth daily as needed (allergies).     Marland Kitchen omeprazole (PRILOSEC OTC) 20 MG tablet Take 20 mg by mouth as needed.    . Sennosides (SENOKOT PO) Take 1 tablet by mouth daily as needed.     . vitamin B-12 (CYANOCOBALAMIN) 1000 MCG tablet Take 1,000 mcg by mouth daily.     Current Facility-Administered Medications on File Prior to Visit  Medication Dose Route Frequency Provider Last Rate Last Admin  . cyanocobalamin ((VITAMIN B-12)) injection 1,000 mcg  1,000 mcg Intramuscular Once Kathrynn Ducking, MD        Allergies  Allergen Reactions  . Erythromycin Other (See Comments)    Yeast infection    Assessment/Plan:  1. Hyperlipidemia - LDL 109 at baseline above goal < 70mg /dL. Pt is intolerant to pravastatin 40mg  daily and after lengthy discussion, she is willing to try low dose rosuvastatin 5mg  3 days a week. She has a follow up appt with neurology next month. Hopefully will have some answers regarding her leg weakness. Discussed that ezetimibe or PCSK9i therapy could be options down the road, but that insurance will not cover branded lipid lowering meds until she has tried at least 2 statins. She plans to work on dietary improvements as well. Will recheck labs in 2-3 months.  Treyton Slimp E. Zenith Lamphier, PharmD, BCACP, [place] [address], [place], [place] [address] Phone: [phone]; Fax: [phone] 02/21/2020 11:20 AM

## 2020-02-21 NOTE — Patient Instructions (Addendum)
It was nice to meet you today  Your LDL is 109 and your goal is < 70  Start taking rosuvastatin (Crestor) 5mg  3 days a week on Mondays, Wednesdays, and Fridays  Call [place], Pharmacist with any questions about your cholesterol [phone]  Recheck fasting cholesterol on Monday October 25th any time after 7:30am

## 2020-03-03 ENCOUNTER — Other Ambulatory Visit: Payer: Self-pay | Admitting: Cardiology

## 2020-03-03 NOTE — Telephone Encounter (Signed)
Pt last saw Dr Radford Pax 02/13/20, last labs 02/17/20 Creat 0.74, age 79, weight 88.9kg, based on specified criteria pt is on appropriate dosage of Eliquis 5mg  BID.  Will refill rx.

## 2020-03-12 ENCOUNTER — Telehealth: Payer: Self-pay | Admitting: Physician Assistant

## 2020-03-12 NOTE — Telephone Encounter (Signed)
Patient states she has a sinus infection and would like to know if she can have her appointment 8/31 virtually.

## 2020-03-12 NOTE — Telephone Encounter (Signed)
Will route to [name], PA-C to see if she is ok with pt's appt being virtual?

## 2020-03-13 NOTE — Telephone Encounter (Signed)
She's on my schedule to evaluate her orthostatic hypotension which I won't be able to do virtual. Hopefully her sinus infection is better by 8/31 but if she isn't dizzy anymore and can adequately assess her BP's at home it can be switched to virtual.

## 2020-03-16 NOTE — Telephone Encounter (Signed)
Called and spoke to patient. She states that she continues to have dizziness and near fainting spells. Patient will keep her appointment onsite with [name], PA tomorrow.

## 2020-03-16 NOTE — Progress Notes (Signed)
Cardiology Office Note    Date:  03/17/2020   ID:  Rhonda [name], DOB Jan 03, 1941, MRN [id number]  PCP:  Lavone Orn, MD  Cardiologist: Fransico Him, MD EPS: None  No chief complaint on file.   History of Present Illness:  Rhonda [name] is a 79 y.o. female with a hx of persistent AF s/p afib ablation 12 years ago,  bilateral carotid stenosis < 50%, PVC's, PAC's and nonsustained atrial tachycardia. She has been followed in afib clinic with Dr. Rayann Heman..   Patient did not want repeat ablation and is now on flecainide.  She has a CHADS2VASC score of 4 and is on Eliquis for anticoagulation.     She underwent home sleep study showing severe sleep apnea with an AHI of 38.1/h.  She underwent CPAP titration but could not be adequately titrated due to ongoing respiratory events.  A BiPAP titration was recommended but patient refused any further studies and stated that she was not going to use a Pap device.   She went back to see Dr. Ola Spurr who had done her original ablation and she had a stress test and Ziopatch.  The stress test was fine and he placed her on Flecainide.     She saw Dr. Radford Pax 02/13/2020 She also has been having problems with orthostatic hypotension and dizziness despite being placed on Florinef 0.1mg  daily which has helped her symptoms but she was still having symptoms so was given prescription for abdominal binder and thigh-high compression hose.   Patient says today she feels good. Sun when outside her BP dropped to 88 systolic.On antibiotics for a cyst on her abdomen and also has had sinus trouble and migraines. Wearing compression stockings. She couldn't get the abdominal binder to fit so she got a girdle type from [employer]. She says she doesn't drink enough water 2-22 ounces of water, hot tea and coffee. Most episodes have occurred when she's outside and standing in 1 spot.     Past Medical History:  Diagnosis Date  . Adenomatous colon polyp    cecum, last colonoscopy  2006  . Atrial tachycardia ([place])    non sustatined with PVCs and PACs  . Bacterial meningitis    as a child  . Basilar migraine   . Carotid stenosis, bilateral 10/13/2016   1-39% bilateral by dopplers 10/2016  . Cystocele    MIld  . History of hemorrhoids   . Hx of chest pain    Atypical  . Hx of hematuria    IVP 1990 left kidney stone, cystoscopy negative  . Hyperlipidemia   . Hyperlipidemia LDL goal <70 10/26/2017  . Hypertension   . Nephrolithiasis   . OSA (obstructive sleep apnea) 10/26/2017   Severe with AHI 38/hr.  Patient refuses PAP therapy  . Osteoporosis    actonel since 2006  . PAF (paroxysmal atrial fibrillation) ([place])    radiofrequency ablation of afib [facility]  . Right facial numbness 5/12   question TIA versus migraine,cardiac monitoring neg for AFIB  . Seizures ([place]) 1980   7 years on Dilantin  . Snoring   . SOB (shortness of breath)    Chronic mild, negative cardiolite  . Tinea versicolor     Past Surgical History:  Procedure Laterality Date  . multiple ankle surgeries      Current Medications: Current Meds  Medication Sig  . acetaminophen (TYLENOL) 325 MG tablet Take 650 mg by mouth every 6 (six) hours as needed for mild pain.  Marland Kitchen  alendronate (FOSAMAX) 70 MG tablet Take 70 mg by mouth once a week. Take with a full glass of water on an empty stomach.  . Ascorbic Acid (VITAMIN C PO) Take 1 tablet by mouth daily.  . Cholecalciferol (VITAMIN D3 PO) Take 625 mg by mouth daily.  Marland Kitchen diltiazem (CARDIZEM) 30 MG tablet Take 30 mg by mouth every 4 (four) hours as needed (afib HR >100).  Marland Kitchen ELIQUIS 5 MG TABS tablet TAKE 1 TABLET BY MOUTH  TWICE DAILY  . flecainide (TAMBOCOR) 50 MG tablet TAKE 1 TABLET BY MOUTH  TWICE DAILY  . fludrocortisone (FLORINEF) 0.1 MG tablet Take 100 mcg by mouth daily.  . Loratadine (CLARITIN PO) Take 1 tablet by mouth daily as needed (allergies).   Marland Kitchen omeprazole (PRILOSEC OTC) 20 MG tablet Take 20 mg by mouth as needed.  . rosuvastatin  (CRESTOR) 5 MG tablet Take 1 tablet by mouth 3 days a week or as tolerated  . Sennosides (SENOKOT PO) Take 1 tablet by mouth daily as needed.   . vitamin B-12 (CYANOCOBALAMIN) 1000 MCG tablet Take 1,000 mcg by mouth daily.   Current Facility-Administered Medications for the 03/17/20 encounter (Office Visit) with Imogene Burn, PA-C  Medication  . cyanocobalamin ((VITAMIN B-12)) injection 1,000 mcg     Allergies:   Erythromycin   Social History   Socioeconomic History  . Marital status: Married    Spouse name: Not on file  . Number of children: Not on file  . Years of education: Not on file  . Highest education level: Not on file  Occupational History  . Not on file  Tobacco Use  . Smoking status: Never Smoker  . Smokeless tobacco: Never Used  Vaping Use  . Vaping Use: Never used  Substance and Sexual Activity  . Alcohol use: Yes    Comment: 1-2 glasses of wine weekly  . Drug use: No  . Sexual activity: Not on file  Other Topics Concern  . Not on file  Social History Narrative  . Not on file   Social Determinants of Health   Financial Resource Strain:   . Difficulty of Paying Living Expenses: Not on file  Food Insecurity:   . Worried About Charity fundraiser in the Last Year: Not on file  . Ran Out of Food in the Last Year: Not on file  Transportation Needs:   . Lack of Transportation (Medical): Not on file  . Lack of Transportation (Non-Medical): Not on file  Physical Activity:   . Days of Exercise per Week: Not on file  . Minutes of Exercise per Session: Not on file  Stress:   . Feeling of Stress : Not on file  Social Connections:   . Frequency of Communication with Friends and Family: Not on file  . Frequency of Social Gatherings with Friends and Family: Not on file  . Attends Religious Services: Not on file  . Active Member of Clubs or Organizations: Not on file  . Attends Archivist Meetings: Not on file  . Marital Status: Not on file      Family History:  The patient's   family history includes Heart attack in her father; Parkinson's disease in her mother.   ROS:   Please see the history of present illness.    ROS All other systems reviewed and are negative.   PHYSICAL EXAM:   VS:  BP (!) 162/88   Pulse 65   Ht 5\' 5"  (1.651 m)  Wt 190 lb (86.2 kg)   BMI 31.62 kg/m   Physical Exam  GEN: Well nourished, well developed, in no acute distress  Neck: no JVD, carotid bruits, or masses Cardiac:RRR; no murmurs, rubs, or gallops  Respiratory:  clear to auscultation bilaterally, normal work of breathing GI: soft, nontender, nondistended, + BS Ext: without cyanosis, clubbing, or edema, Good distal pulses bilaterally Neuro:  Alert and Oriented x 3 Psych: euthymic mood, full affect  Wt Readings from Last 3 Encounters:  03/17/20 190 lb (86.2 kg)  02/13/20 196 lb (88.9 kg)  10/16/19 198 lb 8 oz (90 kg)      Studies/Labs Reviewed:   EKG:  EKG is not ordered today.   Recent Labs: 02/17/2020: ALT 9; BUN 10; Creatinine, Ser 0.74; Hemoglobin 12.3; Platelets 229; Potassium 4.3; Sodium 140   Lipid Panel    Component Value Date/Time   CHOL 193 02/17/2020 0946   TRIG 113 02/17/2020 0946   HDL 64 02/17/2020 0946   CHOLHDL 3.0 02/17/2020 0946   LDLCALC 109 (H) 02/17/2020 0946    Additional studies/ records that were reviewed today include:   NST 04/2017 Study Highlights    Nuclear stress EF: 68%.  There was no ST segment deviation noted during stress.  There is a small defect of mild severity present in the basal inferolateral and mid inferolateral location. The defect is reversible. This likely is due to variations in diaphragmatic attenuation artifact but cannot rule out a very small area of ischemia  This is a low risk study.  The left ventricular ejection fraction is hyperdynamic (>65%).    2D echo 06/06/2019 Study Conclusions   - Left ventricle: The cavity size was normal. Wall thickness was   normal.  Systolic function was normal. The estimated ejection   fraction was in the range of 55% to 60%. Wall motion was normal;   there were no regional wall motion abnormalities. Doppler   parameters are consistent with abnormal left ventricular   relaxation (grade 1 diastolic dysfunction). The E/e&' ratio is   between 8-15, suggesting indeterminate LV filling pressure. - Aortic valve: Trileaflet. Sclerosis without stenosis. There was   trivial regurgitation. - Mitral valve: Mildly thickened leaflets . There was trivial   regurgitation. - Left atrium: The atrium was normal in size. - Tricuspid valve: There was mild regurgitation. - Pulmonary arteries: PA peak pressure: 30 mm Hg (S). - Inferior vena cava: The vessel was normal in size. The   respirophasic diameter changes were in the normal range (= 50%),   consistent with normal central venous pressure.   Impressions:   - LVEF 55-60%, normal wall thickness, normal wall motion, diastolic   dysfunction, indeterminate LV filling pressure, aortic valve   sclerosis with trivial AI, normal LA size, mild TR, RVSP 30 mmHg,   normal IVC.       ASSESSMENT:    1. Orthostatic hypotension   2. Paroxysmal atrial fibrillation (HCC)   3. Essential hypertension   4. Carotid stenosis, bilateral   5. SOB (shortness of breath)   6. Hyperlipidemia, unspecified hyperlipidemia type      PLAN:  In order of problems listed above:  Orthostatic hypotension with dizziness and near syncope started on Florinef by Dr. Laurann Montana.  Was given a prescription for thigh-high compression hose as well as abdominal binder by Dr. Radford Pax.  She recommended not using ProAmatine because of systolic blood pressures in the 150s.. Patient still having orthostatic episodes most occurring when she is outside standing in  a still position.  Systolic blood pressure is 162 today.  Dropped to 123 when standing.  Please see orthostatics for details.  Encouraged her to get a proper  abdominal binder and increase hydration.  Follow-up with Dr. Radford Pax.  Persistent atrial fibrillation status post ablation 12 years ago with recurrent PAF started on flecainide and Eliquis  Essential hypertension Systolic BP running high today but still drops over 40 points with standing.  Bilateral carotid artery stenosis 1 to 39% left carotid Dopplers 01/2019  Dyspnea on exertion felt secondary to deconditioning and sedentary lifestyle.  Echo 05/2019 normal LVEF with grade 1 DD  Hyperlipidemia LDL 109 on Crestor.   Obesity-having trouble losing weight since she is so sedentary. refer to medical weight loss management   Medication Adjustments/Labs and Tests Ordered: Current medicines are reviewed at length with the patient today.  Concerns regarding medicines are outlined above.  Medication changes, Labs and Tests ordered today are listed in the Patient Instructions below. Patient Instructions  Medication Instructions:  *If you need a refill on your cardiac medications before your next appointment, please call your pharmacy*  Follow-Up: At [facility], you and your health needs are our priority.  As part of our continuing mission to provide you with exceptional heart care, we have created designated Provider Care Teams.  These Care Teams include your primary Cardiologist (physician) and Advanced Practice Providers (APPs -  Physician Assistants and Nurse Practitioners) who all work together to provide you with the care you need, when you need it.  We recommend signing up for the patient portal called "MyChart".  Sign up information is provided on this After Visit Summary.  MyChart is used to connect with patients for Virtual Visits (Telemedicine).  Patients are able to view lab/test results, encounter notes, upcoming appointments, etc.  Non-urgent messages can be sent to your provider as well.   To learn more about what you can do with MyChart, go to [email or web address].    Your next  appointment:   Your physician recommends that you schedule a follow-up appointment in: 2 MONTHS with Dr. Radford Pax - Tuesday 05/05/20 at 3:00 pm  The format for your next appointment:   In Person with Fransico Him, MD        Signed, [name], PA-C  03/17/2020 2:06 PM    [address], [place],   [address] Phone: [phone]; Fax: [phone]

## 2020-03-17 ENCOUNTER — Ambulatory Visit: Payer: Medicare Other | Admitting: Physician Assistant

## 2020-03-17 ENCOUNTER — Other Ambulatory Visit: Payer: Self-pay

## 2020-03-17 ENCOUNTER — Encounter: Payer: Self-pay | Admitting: Physician Assistant

## 2020-03-17 VITALS — BP 162/88 | HR 65 | Ht 65.0 in | Wt 190.0 lb

## 2020-03-17 DIAGNOSIS — I10 Essential (primary) hypertension: Secondary | ICD-10-CM

## 2020-03-17 DIAGNOSIS — I6523 Occlusion and stenosis of bilateral carotid arteries: Secondary | ICD-10-CM

## 2020-03-17 DIAGNOSIS — I951 Orthostatic hypotension: Secondary | ICD-10-CM

## 2020-03-17 DIAGNOSIS — R0602 Shortness of breath: Secondary | ICD-10-CM

## 2020-03-17 DIAGNOSIS — E785 Hyperlipidemia, unspecified: Secondary | ICD-10-CM

## 2020-03-17 DIAGNOSIS — I480 Paroxysmal atrial fibrillation: Secondary | ICD-10-CM

## 2020-03-17 NOTE — Patient Instructions (Addendum)
Medication Instructions:  *If you need a refill on your cardiac medications before your next appointment, please call your pharmacy*  Follow-Up: At [facility], you and your health needs are our priority.  As part of our continuing mission to provide you with exceptional heart care, we have created designated Provider Care Teams.  These Care Teams include your primary Cardiologist (physician) and Advanced Practice Providers (APPs -  Physician Assistants and Nurse Practitioners) who all work together to provide you with the care you need, when you need it.  We recommend signing up for the patient portal called "MyChart".  Sign up information is provided on this After Visit Summary.  MyChart is used to connect with patients for Virtual Visits (Telemedicine).  Patients are able to view lab/test results, encounter notes, upcoming appointments, etc.  Non-urgent messages can be sent to your provider as well.   To learn more about what you can do with MyChart, go to [email or web address].    Your next appointment:   Your physician recommends that you schedule a follow-up appointment in: 2 MONTHS with Dr. Radford Pax - Tuesday 05/05/20 at 3:00 pm  The format for your next appointment:   In Person with Fransico Him, MD

## 2020-03-19 ENCOUNTER — Other Ambulatory Visit: Payer: Self-pay

## 2020-03-19 ENCOUNTER — Encounter: Payer: Self-pay | Admitting: Neurology

## 2020-03-19 ENCOUNTER — Ambulatory Visit: Payer: Medicare Other | Admitting: Neurology

## 2020-03-19 DIAGNOSIS — E538 Deficiency of other specified B group vitamins: Secondary | ICD-10-CM | POA: Insufficient documentation

## 2020-03-19 DIAGNOSIS — M6281 Muscle weakness (generalized): Secondary | ICD-10-CM

## 2020-03-19 HISTORY — DX: Deficiency of other specified B group vitamins: E53.8

## 2020-03-19 HISTORY — DX: Muscle weakness (generalized): M62.81

## 2020-03-19 NOTE — Progress Notes (Signed)
Reason for visit: Muscle weakness, orthostatic hypotension  Rhonda [name] is an 79 y.o. female  History of present illness:  Rhonda [name] is a 79 year old right-handed white female with a history of orthostatic hypotension since 2011, atrial fibrillation, and a low B12 level.  The patient has undergone EMG and nerve conduction study evaluation that showed evidence of a mild primarily sensory peripheral neuropathy, no clear evidence of a myopathic disorder was noted.  The patient also reports some mild memory issues.  Physical therapy was recommended for leg strengthening exercises.  The patient remains relatively inactive, she does not exercise on a regular basis.  She is limited to some degree with her orthostatic hypotension, she will have a tendency to get weak and collapse if she is standing for too long or if she is out in the heat.  She was placed on Crestor a month ago.  The patient indicates that she has been running low-grade fevers over the last month and a 99-100 range.  She has also been treated for an abscessed cyst, she has just completed a course of doxycycline.  She is in a weight loss program currently.  She has not had any falls since last seen.  She is now using compression stockings and abdominal binder for her orthostatic hypotension.  She is on B12 supplementation.  Past Medical History:  Diagnosis Date  . Abscess 2021   abdominal cyst, ruptured, treated with Doxycycline   . Adenomatous colon polyp    cecum, last colonoscopy 2006  . Atrial tachycardia ([place])    non sustatined with PVCs and PACs  . Bacterial meningitis    as a child  . Basilar migraine   . Carotid stenosis, bilateral 10/13/2016   1-39% bilateral by dopplers 10/2016  . Cystocele    MIld  . History of hemorrhoids   . Hx of chest pain    Atypical  . Hx of hematuria    IVP 1990 left kidney stone, cystoscopy negative  . Hyperlipidemia   . Hyperlipidemia LDL goal <70 10/26/2017  . Hypertension   .  Nephrolithiasis   . OSA (obstructive sleep apnea) 10/26/2017   Severe with AHI 38/hr.  Patient refuses PAP therapy  . Osteoporosis    actonel since 2006  . PAF (paroxysmal atrial fibrillation) ([place])    radiofrequency ablation of afib [facility]  . Right facial numbness 5/12   question TIA versus migraine,cardiac monitoring neg for AFIB  . Seizures ([place]) 1980   7 years on Dilantin  . Snoring   . SOB (shortness of breath)    Chronic mild, negative cardiolite  . Tinea versicolor     Past Surgical History:  Procedure Laterality Date  . multiple ankle surgeries      Family History  Problem Relation Age of Onset  . Parkinson's disease Mother   . Heart attack Father     Social history:  reports that she has never smoked. She has never used smokeless tobacco. She reports previous alcohol use. She reports that she does not use drugs.    Allergies  Allergen Reactions  . Erythromycin Other (See Comments)    Yeast infection    Medications:  Prior to Admission medications   Medication Sig Start Date End Date Taking? Authorizing Provider  acetaminophen (TYLENOL) 325 MG tablet Take 650 mg by mouth every 6 (six) hours as needed for mild pain.   Yes [provider]  alendronate (FOSAMAX) 70 MG tablet Take 70 mg by mouth  once a week. Take with a full glass of water on an empty stomach.   Yes [provider]  Ascorbic Acid (VITAMIN C PO) Take 1 tablet by mouth daily.   Yes [provider]  Cholecalciferol (VITAMIN D3 PO) Take 625 mg by mouth daily.   Yes [provider]  diltiazem (CARDIZEM) 30 MG tablet Take 30 mg by mouth every 4 (four) hours as needed (afib HR >100).   Yes [provider]  DOXYCYCLINE PO Take by mouth. Currently on the last day.   Yes [provider]  ELIQUIS 5 MG TABS tablet TAKE 1 TABLET BY MOUTH  TWICE DAILY 03/03/20  Yes Turner, Eber Hong, MD  flecainide (TAMBOCOR) 50 MG tablet TAKE 1 TABLET BY MOUTH  TWICE DAILY  12/23/19  Yes Turner, Eber Hong, MD  fludrocortisone (FLORINEF) 0.1 MG tablet Take 100 mcg by mouth daily. 11/24/19  Yes [provider]  Loratadine (CLARITIN PO) Take 1 tablet by mouth daily as needed (allergies).    Yes [provider]  omeprazole (PRILOSEC OTC) 20 MG tablet Take 20 mg by mouth as needed.   Yes [provider]  rosuvastatin (CRESTOR) 5 MG tablet Take 1 tablet by mouth 3 days a week or as tolerated 02/21/20  Yes Turner, Traci R, MD  Sennosides (SENOKOT PO) Take 1 tablet by mouth daily as needed.    Yes [provider]  vitamin B-12 (CYANOCOBALAMIN) 1000 MCG tablet Take 1,000 mcg by mouth daily.   Yes [provider]    ROS:  Out of a complete 14 system review of symptoms, the patient complains only of the following symptoms, and all other reviewed systems are negative.  Walking difficulty, muscle weakness Mild memory problems Low-grade fevers  Blood pressure (!) 144/87, pulse 64, height 5\' 5"  (1.651 m), weight 189 lb (85.7 kg).  Blood pressure, standing, right arm is 96/68.  Physical Exam  General: The patient is alert and cooperative at the time of the examination.  The patient is moderately obese.  Skin: No significant peripheral edema is noted.   Neurologic Exam  Mental status: The patient is alert and oriented x 3 at the time of the examination. The patient has apparent normal recent and remote memory, with an apparently normal attention span and concentration ability.   Cranial nerves: Facial symmetry is present. Speech is normal, no aphasia or dysarthria is noted. Extraocular movements are full. Visual fields are full.  Motor: The patient has good strength in all 4 extremities, with exception of mild weakness with biceps and triceps muscles bilaterally, and 4/5 strength with hip flexion bilaterally.  Sensory examination: Soft touch sensation is symmetric on the face, arms, and legs.  Coordination: The patient has good  finger-nose-finger and heel-to-shin bilaterally.  Gait and station: The patient is unable to arise from a seated position with arms crossed.  She has to push off with her arms to stand.  Once up, she can walk independently.  Gait is minimally wide-based.  Romberg is negative.  Reflexes: Deep tendon reflexes are symmetric, with exception that the left knee jerk reflexes depressed as compared to the right.   MRI brain 11/13/19:  IMPRESSION:   Unremarkable MRI brain (without). No acute findings.  * MRI scan images were reviewed online. I agree with the written report.    Assessment/Plan:  1.  Proximal muscle weakness  2.  Probable deconditioning  3.  Orthostatic hypotension  4.  Reports of mild memory problems  5.  Vitamin B12 deficiency  The patient continues to have mild proximal muscle weakness, this has not changed since her last visit.  The patient is very inactive, I have recommended a regular exercise program and I have suggested getting her into physical therapy to develop a home regimen.  She will discuss this with her primary care physician.  She continues to have problems with orthostatic hypotension which may limit her ability to walk longer distances.  I will follow up with her in 6 months to reassess her muscle strength at that time.  She has had low-grade fevers recently but she has also been treated for an abscess.  She will remain on vitamin B12 supplementation.  Jill Alexanders MD 03/19/2020 9:02 AM  [facility] [address] [place], [place] [address]  Phone [phone] Fax [phone]

## 2020-03-24 ENCOUNTER — Ambulatory Visit (INDEPENDENT_AMBULATORY_CARE_PROVIDER_SITE_OTHER): Payer: Medicare Other | Admitting: Family Medicine

## 2020-03-24 ENCOUNTER — Other Ambulatory Visit: Payer: Self-pay

## 2020-03-24 ENCOUNTER — Encounter (INDEPENDENT_AMBULATORY_CARE_PROVIDER_SITE_OTHER): Payer: Self-pay | Admitting: Family Medicine

## 2020-03-24 VITALS — BP 149/83 | HR 66 | Temp 98.2°F | Ht 65.0 in | Wt 184.0 lb

## 2020-03-24 DIAGNOSIS — Z0289 Encounter for other administrative examinations: Secondary | ICD-10-CM

## 2020-03-24 DIAGNOSIS — R5383 Other fatigue: Secondary | ICD-10-CM | POA: Diagnosis not present

## 2020-03-24 DIAGNOSIS — I471 Supraventricular tachycardia: Secondary | ICD-10-CM | POA: Diagnosis not present

## 2020-03-24 DIAGNOSIS — Z6830 Body mass index (BMI) 30.0-30.9, adult: Secondary | ICD-10-CM

## 2020-03-24 DIAGNOSIS — I10 Essential (primary) hypertension: Secondary | ICD-10-CM | POA: Diagnosis not present

## 2020-03-24 DIAGNOSIS — R0602 Shortness of breath: Secondary | ICD-10-CM | POA: Diagnosis not present

## 2020-03-24 DIAGNOSIS — Z1331 Encounter for screening for depression: Secondary | ICD-10-CM | POA: Diagnosis not present

## 2020-03-24 DIAGNOSIS — M6281 Muscle weakness (generalized): Secondary | ICD-10-CM

## 2020-03-24 DIAGNOSIS — E538 Deficiency of other specified B group vitamins: Secondary | ICD-10-CM

## 2020-03-24 DIAGNOSIS — E669 Obesity, unspecified: Secondary | ICD-10-CM | POA: Diagnosis not present

## 2020-03-24 DIAGNOSIS — E785 Hyperlipidemia, unspecified: Secondary | ICD-10-CM

## 2020-03-25 LAB — TSH: TSH: 2.31 u[IU]/mL (ref 0.450–4.500)

## 2020-03-25 LAB — VITAMIN B12: Vitamin B-12: 1077 pg/mL (ref 232–1245)

## 2020-03-25 LAB — VITAMIN D 25 HYDROXY (VIT D DEFICIENCY, FRACTURES): Vit D, 25-Hydroxy: 66.6 ng/mL (ref 30.0–100.0)

## 2020-03-25 LAB — FOLATE: Folate: 14 ng/mL (ref >3.0)

## 2020-03-25 LAB — INSULIN, RANDOM: INSULIN: 15.6 u[IU]/mL (ref 2.6–24.9)

## 2020-03-25 LAB — T3: T3, Total: 104 ng/dL (ref 71–180)

## 2020-03-25 LAB — T4, FREE: Free T4: 1.31 ng/dL (ref 0.82–1.77)

## 2020-03-25 LAB — HEMOGLOBIN A1C
Est. average glucose Bld gHb Est-mCnc: 114 mg/dL
Hgb A1c MFr Bld: 5.6 % (ref 4.8–5.6)

## 2020-03-25 NOTE — Progress Notes (Signed)
Dear Rhonda Barrios, PA-C,   Thank you for referring Rhonda [name] to our clinic. The following note includes my evaluation and treatment recommendations.  Chief Complaint:   Rhonda [name] (MR# [id number]) is a 79 y.o. female who presents for evaluation and treatment of obesity and related comorbidities. Current BMI is Body mass index is 30.62 kg/m. Rhonda [name] has been struggling with her weight for many years and has been unsuccessful in either losing weight, maintaining weight loss, or reaching her healthy weight goal.  Rhonda [name] is currently in the action stage of change and ready to dedicate time achieving and maintaining a healthier weight. Rhonda [name] is interested in becoming our patient and working on intensive lifestyle modifications including (but not limited to) diet and exercise for weight loss.  Rhonda [name] was sent her by her cardiology team due to her weight, and they recommend she lose weight to hep with her cholesterol levels.  Her PCP is Dr. Lavone [name].  She lives with her husband, Rhonda [name], 74 years old, who she states eats very "unhealthily".  Rhonda [name]'s habits were reviewed today and are as follows: Her family eats meals together, she thinks her family will eat healthier with her, her desired weight loss is 24 pounds, she started gaining weight in 1992 and again in 2017, her heaviest weight ever was 205 pounds, she craves pasta, she lunch sometimes, she is frequently drinking liquids with calories, she frequently makes poor food choices and she frequently eats larger portions than normal.  Depression Screen Rhonda [name]'s Food and Mood (modified PHQ-9) score was 2.  Depression screen [facility] 2/9 03/24/2020  Decreased Interest 2  Down, Depressed, Hopeless 1  PHQ - 2 Score 3  Altered sleeping 0  Tired, decreased energy 1  Change in appetite 2  Feeling bad or failure about yourself  0  Trouble concentrating 0  Moving slowly or fidgety/restless 0  PHQ-9 Score 6   Subjective:   1. Other  fatigue Rhonda [name] denies daytime somnolence and denies waking up still tired. Patent has a history of symptoms of snoring. Rhonda [name] generally gets 8 hours of sleep per night, and states that she has generally restful sleep. Snoring is present. Apneic episodes are present. Epworth Sleepiness Score is 6.  2. Shortness of breath on exertion Rhonda [name] notes increasing shortness of breath with exercising and seems to be worsening over time with weight gain. She notes getting out of breath sooner with activity than she used to. This has gotten worse recently. Rhonda [name] denies shortness of breath at rest or orthopnea.  3. Atrial tachycardia, paroxysmal ([place]) She is on diltiazem and flecainide per Cardiology.  Also taking Eliquis.  She is without complaints or concerns today.  4. Benign essential HTN (with chronic orthostatic hypotension) Calcium chain blockers - management per Cardiology.  Also with history of orthostatic hypotension, treated by Cardiology with florinef.  She is without symptoms or complaints today.  BP Readings from Last 3 Encounters:  03/24/20 (!) 149/83  03/19/20 (!) 144/87  03/17/20 (!) 162/88   5. Hyperlipidemia LDL goal <70 Crestor per Cardiology.  She has chronic bilateral left extremity myalgias, which apparently have not gotten any worse on statin therapy.  She states that when her weight was less in the past, her cholesterol was not any better.  Lab Results  Component Value Date   ALT 9 02/17/2020   AST 15 02/17/2020   ALKPHOS 47 (L) 02/17/2020   BILITOT 0.3 02/17/2020   Lab Results  Component Value Date  CHOL 193 02/17/2020   HDL 64 02/17/2020   LDLCALC 109 (H) 02/17/2020   TRIG 113 02/17/2020   CHOLHDL 3.0 02/17/2020   6. Muscle weakness (generalized) Chronic.  She has had a workup per her PCP.  Negative workup thus far, she says.  7. B12 deficiency She notes fatigue and muscle weakness. She is not a vegetarian.  She does not have a previous diagnosis of pernicious anemia.   She does not have a history of weight loss surgery.  She takes vitamin B12 1000 mcg OTC daily.   Lab Results  Component Value Date   VITAMINB12 1,077 03/24/2020   8. Depression screening Crystallynn was screened for depression as part of her new patient workup.  PHQ-9 is 2.  Assessment/Plan:   1. Other fatigue Rhonda [name] does feel that her weight is causing her energy to be lower than it should be. Fatigue may be related to obesity, depression or many other causes. Labs will be ordered, and in the meanwhile, Rhonda [name] will focus on self care including making healthy food choices, increasing physical activity and focusing on stress reduction.  IC and check labs today.  - Vitamin B12 - T3 - T4, free - TSH - VITAMIN D 25 Hydroxy (Vit-D Deficiency, Fractures) - Hemoglobin A1c - Insulin, random - Folate  2. Shortness of breath on exertion Rhonda [name] does feel that she gets out of breath more easily that she used to when she exercises. Rhonda [name]'s shortness of breath appears to be obesity related and exercise induced. She has agreed to work on weight loss and gradually increase exercise to treat her exercise induced shortness of breath. Will continue to monitor closely.  IC and check labs today.  - T3 - T4, free - TSH  3. Atrial tachycardia, paroxysmal ([facility]) Symptoms are stable, treament per Cardiology/PCP.  Will check thyroid labs and others today to ensure within normal limits and not contributing to condition.  - Vitamin B12 - T3 - T4, free - TSH - Hemoglobin A1c - Insulin, random - Folate  4. Benign essential HTN (with chronic orthostatic hypotension) Blood pressure appears to be much better controlled today than when at prior office visits with her cardiology team/PCP.  Continue medications per other doctors and follow prudent nutritional plan and weight loss.  - T3 - T4, free - TSH - Hemoglobin A1c - Insulin, random  5. Hyperlipidemia LDL goal <70 Explained to her that weight los may or may not  improve her cholesterol levels.  Certainly encouraged lean meats and cheeses and recommend she follow the meal plan to attain the best outcomes with cholesterol levels.  Check labs today.  6. Muscle weakness (generalized) Check labs (B12, thyroid, vitamin D).  - Vitamin B12 - T3 - T4, free - TSH - VITAMIN D 25 Hydroxy (Vit-D Deficiency, Fractures) - Hemoglobin A1c - Insulin, random - Folate  7. B12 deficiency Check labs today.  - Vitamin B12  8. Depression screening Depression screen was negative today.  9. Class 1 obesity with serious comorbidity and body mass index (BMI) of 30.0 to 30.9 in adult, unspecified obesity type Rhonda [name] is currently in the action stage of change and her goal is to continue with weight loss efforts. I recommend Rhonda [name] begin the structured treatment plan as follows:  She has agreed to the Category 1 Plan.  Exercise goals: As is.   Behavioral modification strategies: increasing lean protein intake, decreasing simple carbohydrates, increasing water intake, decreasing liquid calories, no skipping meals, meal planning and cooking strategies,  better snacking choices and planning for success.  She was informed of the importance of frequent follow-up visits to maximize her success with intensive lifestyle modifications for her multiple health conditions. She was informed we would discuss her lab results at her next visit unless there is a critical issue that needs to be addressed sooner. Rhonda [name] agreed to keep her next visit at the agreed upon time to discuss these results.  Objective:   Blood pressure (!) 149/83, pulse 66, temperature 98.2 F (36.8 C), height 5\' 5"  (1.651 m), weight 184 lb (83.5 kg), SpO2 96 %. Body mass index is 30.62 kg/m.  Indirect Calorimeter completed today shows a VO2 of 163 and a REE of 1134.  Her calculated basal metabolic rate is 2671 thus her basal metabolic rate is worse than expected.  General: Cooperative, alert, well developed, in no  acute distress. HEENT: Conjunctivae and lids unremarkable. Cardiovascular: Regular rhythm.  Lungs: Normal work of breathing. Neurologic: No focal deficits.   Lab Results  Component Value Date   CREATININE 0.74 02/17/2020   BUN 10 02/17/2020   NA 140 02/17/2020   K 4.3 02/17/2020   CL 107 (H) 02/17/2020   CO2 23 02/17/2020   Lab Results  Component Value Date   ALT 9 02/17/2020   AST 15 02/17/2020   ALKPHOS 47 (L) 02/17/2020   BILITOT 0.3 02/17/2020   Lab Results  Component Value Date   HGBA1C 5.6 03/24/2020   Lab Results  Component Value Date   INSULIN 15.6 03/24/2020   Lab Results  Component Value Date   TSH 2.310 03/24/2020   Lab Results  Component Value Date   CHOL 193 02/17/2020   HDL 64 02/17/2020   LDLCALC 109 (H) 02/17/2020   TRIG 113 02/17/2020   CHOLHDL 3.0 02/17/2020   Lab Results  Component Value Date   WBC 5.2 02/17/2020   HGB 12.3 02/17/2020   HCT 36.9 02/17/2020   MCV 92 02/17/2020   PLT 229 02/17/2020   Obesity Behavioral Intervention:   Approximately 15 minutes were spent on the discussion below.  ASK: We discussed the diagnosis of obesity with Rai today and Shelvia agreed to give [place] permission to discuss obesity behavioral modification therapy today.  ASSESS: Edwyna has the diagnosis of obesity and her BMI today is 30.8. Vici is in the action stage of change.   ADVISE: Biana was educated on the multiple health risks of obesity as well as the benefit of weight loss to improve her health. She was advised of the need for long term treatment and the importance of lifestyle modifications to improve her current health and to decrease her risk of future health problems.  AGREE: Multiple dietary modification options and treatment options were discussed and Margorie agreed to follow the recommendations documented in the above note.  ARRANGE: Ariaunna was educated on the importance of frequent visits to treat obesity as outlined per CMS and USPSTF  guidelines and agreed to schedule her next follow up appointment today.  Attestation Statements:   Reviewed by clinician on day of visit: allergies, medications, problem list, medical history, surgical history, family history, social history, and previous encounter notes.  This is the patient's first visit at [facility].  The patient's NEW PATIENT PACKET that they filled out prior to today's office visit was reviewed at length and some information from that paperwork was also included within the following office visit note.    Included in the packet: current and past health history,  medications, allergies, ROS, gynecologic history (women only), surgical history, family history, social history, weight history, weight loss surgery history (for those that have had weight loss surgery), nutritional evaluation, mood and food questionnaire along with a depression screening (PHQ9) on all patients, an Epworth questionnaire, sleep habits questionnaire, patient life and health improvement goals questionnaire. These will all be scanned into the patient's chart under media.   During the visit, I independently reviewed the patient's EKG, bioimpedance scale results, and indirect calorimeter results. I used this information to tailor a meal plan for the patient that will help Raisa to lose weight and will improve her obesity-related conditions going forward. I performed a medically necessary appropriate examination and/or evaluation. I discussed the assessment and treatment plan with the patient. The patient was provided an opportunity to ask questions and all were answered. The patient agreed with the plan and demonstrated an understanding of the instructions. Labs were ordered at this visit and will be reviewed at the next visit unless more critical results need to be addressed immediately. Clinical information was updated and documented in the EMR.   Time spent on visit including pre-visit chart  review and post-visit care was estimated to be 60-74 minutes.  A separate 15 minutes was spent on risk counseling (see above/below).   I, Water quality scientist, CMA, am acting as Location manager for [employer], DO.  I have reviewed the above documentation for accuracy and completeness, and I agree with the above. Mellody Dance, DO

## 2020-04-07 ENCOUNTER — Other Ambulatory Visit: Payer: Self-pay

## 2020-04-07 ENCOUNTER — Ambulatory Visit (INDEPENDENT_AMBULATORY_CARE_PROVIDER_SITE_OTHER): Payer: Medicare Other | Admitting: Family Medicine

## 2020-04-07 ENCOUNTER — Encounter (INDEPENDENT_AMBULATORY_CARE_PROVIDER_SITE_OTHER): Payer: Self-pay | Admitting: Family Medicine

## 2020-04-07 VITALS — BP 157/85 | HR 59 | Temp 97.8°F | Ht 65.0 in | Wt 180.0 lb

## 2020-04-07 DIAGNOSIS — E538 Deficiency of other specified B group vitamins: Secondary | ICD-10-CM

## 2020-04-07 DIAGNOSIS — E8881 Metabolic syndrome: Secondary | ICD-10-CM

## 2020-04-07 DIAGNOSIS — E669 Obesity, unspecified: Secondary | ICD-10-CM | POA: Diagnosis not present

## 2020-04-07 DIAGNOSIS — E559 Vitamin D deficiency, unspecified: Secondary | ICD-10-CM

## 2020-04-07 DIAGNOSIS — I10 Essential (primary) hypertension: Secondary | ICD-10-CM

## 2020-04-07 DIAGNOSIS — E7849 Other hyperlipidemia: Secondary | ICD-10-CM

## 2020-04-07 DIAGNOSIS — Z6830 Body mass index (BMI) 30.0-30.9, adult: Secondary | ICD-10-CM | POA: Diagnosis not present

## 2020-04-09 NOTE — Progress Notes (Signed)
Chief Complaint:   OBESITY Rhonda [name] is here to discuss her progress with her obesity treatment plan along with follow-up of her obesity related diagnoses. Rhonda [name] is on the Category 1 Plan and states she is following her eating plan approximately 65% of the time. Rhonda [name] states she is exercising for 0 minutes 0 times per week.  Today's visit was #: 2 Starting weight: 184 lbs Starting date: 03/24/2020 Today's weight: 180 lbs Today's date: 04/07/2020 Total lbs lost to date: 4 lbs Total lbs lost since last in-office visit: 4 lbs  Interim History: Rhonda [name] says she saw her PCP 2 days after she saw me, and they questioned her on why she ws sent to [place] by Cardiology.  She finds the Category 1 plan to be too much food.  She is eating less food than is on her plan such as 1 egg instead of 2 and for lunch she eats a yogurt and a pear or apple.  She is not weighing foods every time.  She finds it difficult to fix her husband one thing and herself another.  Subjective:   1. Essential hypertension, with orthostatic hypotension Review: taking medications as instructed, no medication side effects noted, no chest pain on exertion, no dyspnea on exertion, no swelling of ankles.  Poorly controlled blood pressure now.  She says her PCP increased her Florinef from daily to twice daily due to her having orthostatic hypotension.  They want it ot be highter in order to minimize her dizziness upon standing.  BP Readings from Last 3 Encounters:  04/07/20 (!) 157/85  03/24/20 (!) 149/83  03/19/20 (!) 144/87   2. Insulin resistance Rhonda [name] has a diagnosis of insulin resistance based on her elevated fasting insulin level >5. She continues to work on diet and exercise to decrease her risk of diabetes.  Positive elevated fasting insulin.  Lab Results  Component Value Date   INSULIN 15.6 03/24/2020   Lab Results  Component Value Date   HGBA1C 5.6 03/24/2020   3. Other hyperlipidemia Rhonda [name] has hyperlipidemia and has been  trying to improve her cholesterol levels with intensive lifestyle modification including a low saturated fat diet, exercise and weight loss. She denies any chest pain, claudication or myalgias.  Negative myalgias.  LFTs within normal limits.  She is taking Crestor 5 mg 3 days per week.  Tolerating well.  Lab Results  Component Value Date   ALT 9 02/17/2020   AST 15 02/17/2020   ALKPHOS 47 (L) 02/17/2020   BILITOT 0.3 02/17/2020   Lab Results  Component Value Date   CHOL 193 02/17/2020   HDL 64 02/17/2020   LDLCALC 109 (H) 02/17/2020   TRIG 113 02/17/2020   CHOLHDL 3.0 02/17/2020   4. Vitamin B12 deficiency She is not a vegetarian.  She does not have a previous diagnosis of pernicious anemia.  She does not have a history of weight loss surgery.  Asymptomatic.  Denies issues.  Lab Results  Component Value Date   VITAMINB12 1,077 03/24/2020   5. Vitamin D deficiency Rhonda [name]'s Vitamin D level was 66.6 on 03/24/2020. She is currently taking OTC vitamin D 1,000 IU each day. She denies nausea, vomiting or muscle weakness.  Assessment/Plan:   1. Essential hypertension, with orthostatic hypotension Discussed labs with patient today.  Rhonda [name] is working on healthy weight loss and exercise to improve blood pressure control. We will watch for signs of hypotension as she continues her lifestyle modifications.  Management per PCP/ cards.  She  states her blood pressure is at goal today.  PCP's goal is SBP over 100 upon initially standing and increased at rest.  Blood pressure is at goal today secondary to her medication.  2. Insulin resistance New.  Discussed labs with patient today.  Rhonda [name] will continue to work on weight loss, exercise, and decreasing simple carbohydrates to help decrease the risk of diabetes. Rhonda [name] agreed to follow-up with [place] as directed to closely monitor her progress.  If weight loss stalls or hunger increases, will reassess medication management.  Will follow every 3-4 months.  3.  Other hyperlipidemia Discussed labs with patient today.  Cardiovascular risk and specific lipid/LDL goals reviewed.  We discussed several lifestyle modifications today and Rhonda [name] will continue to work on diet, exercise and weight loss efforts. Orders and follow up as documented in patient record. At goal.  Management per Cardiology- crestor 5mg .  Weight loss, prudent nutritional plan.  Counseling Intensive lifestyle modifications are the first line treatment for this issue. . Dietary changes: Increase soluble fiber. Decrease simple carbohydrates. . Exercise changes: Moderate to vigorous-intensity aerobic activity 150 minutes per week if tolerated. . Lipid-lowering medications: see documented in medical record.  4. Vitamin B12 deficiency Discussed labs with patient today.  The diagnosis was reviewed with the patient. Counseling provided today, see below. We will continue to monitor. Orders and follow up as documented in patient record.  At goal.  Continue supplement.  Counseling . The body needs vitamin B12: to make red blood cells; to make DNA; and to help the nerves work properly so they can carry messages from the brain to the body.  . The main causes of vitamin B12 deficiency include dietary deficiency, digestive diseases, pernicious anemia, and having a surgery in which part of the stomach or small intestine is removed.  . Certain medicines can make it harder for the body to absorb vitamin B12. These medicines include: heartburn medications; some antibiotics; some medications used to treat diabetes, gout, and high cholesterol.  . In some cases, there are no symptoms of this condition. If the condition leads to anemia or nerve damage, various symptoms can occur, such as weakness or fatigue, shortness of breath, and numbness or tingling in your hands and feet.   . Treatment:  o May include taking vitamin B12 supplements.  o Avoid alcohol.  o Eat lots of healthy foods that contain vitamin  B12: - Beef, pork, chicken, [place], and organ meats, such as liver.  - Seafood: This includes clams, rainbow trout, salmon, tuna, and haddock. Eggs.  - Cereal and dairy products that are fortified: This means that vitamin B12 has been added to the food.   5. Vitamin D deficiency Discussed labs with patient today.  Low Vitamin D level contributes to fatigue and are associated with obesity, breast, and colon cancer. She agrees to continue to take OTC Vitamin D @1 ,000 IU daily and will follow-up for routine testing of Vitamin D, at least 2-3 times per year to avoid over-replacement.  Levels are within normal limits and at goal.  6. Class 1 obesity with serious comorbidity and body mass index (BMI) of 30.0 to 30.9 in adult, unspecified obesity type  Rhonda [name] is currently in the action stage of change. As such, her goal is to continue with weight loss efforts. She has agreed to the Category 1 Plan.   Exercise goals: As is.  Behavioral modification strategies: increasing lean protein intake, increasing vegetables, meal planning and cooking strategies, keeping healthy foods in the home,  ways to avoid night time snacking, better snacking choices and planning for success.  Eat all foods on plan.  Rhonda [name] has agreed to follow-up with our clinic in 2 weeks. She was informed of the importance of frequent follow-up visits to maximize her success with intensive lifestyle modifications for her multiple health conditions.   Objective:   Blood pressure (!) 157/85, pulse (!) 59, temperature 97.8 F (36.6 C), height 5\' 5"  (1.651 m), weight 180 lb (81.6 kg), SpO2 95 %. Body mass index is 29.95 kg/m.  General: Cooperative, alert, well developed, in no acute distress. HEENT: Conjunctivae and lids unremarkable. Cardiovascular: Regular rhythm.  Lungs: Normal work of breathing. Neurologic: No focal deficits.   Lab Results  Component Value Date   CREATININE 0.74 02/17/2020   BUN 10 02/17/2020   NA 140 02/17/2020    K 4.3 02/17/2020   CL 107 (H) 02/17/2020   CO2 23 02/17/2020   Lab Results  Component Value Date   ALT 9 02/17/2020   AST 15 02/17/2020   ALKPHOS 47 (L) 02/17/2020   BILITOT 0.3 02/17/2020   Lab Results  Component Value Date   HGBA1C 5.6 03/24/2020   Lab Results  Component Value Date   INSULIN 15.6 03/24/2020   Lab Results  Component Value Date   TSH 2.310 03/24/2020   Lab Results  Component Value Date   CHOL 193 02/17/2020   HDL 64 02/17/2020   LDLCALC 109 (H) 02/17/2020   TRIG 113 02/17/2020   CHOLHDL 3.0 02/17/2020   Lab Results  Component Value Date   WBC 5.2 02/17/2020   HGB 12.3 02/17/2020   HCT 36.9 02/17/2020   MCV 92 02/17/2020   PLT 229 02/17/2020   Obesity Behavioral Intervention:   Approximately 15 minutes were spent on the discussion below.  ASK: We discussed the diagnosis of obesity with Rhonda [name] today and Rhonda [name] agreed to give [place] permission to discuss obesity behavioral modification therapy today.  ASSESS: Rhonda [name] has the diagnosis of obesity and her BMI today is 30.1. Rhonda [name] is in the action stage of change.   ADVISE: Rhonda [name] was educated on the multiple health risks of obesity as well as the benefit of weight loss to improve her health. She was advised of the need for long term treatment and the importance of lifestyle modifications to improve her current health and to decrease her risk of future health problems.  AGREE: Multiple dietary modification options and treatment options were discussed and Rhonda [name] agreed to follow the recommendations documented in the above note.  ARRANGE: Rhonda [name] was educated on the importance of frequent visits to treat obesity as outlined per CMS and USPSTF guidelines and agreed to schedule her next follow up appointment today.  Attestation Statements:   Reviewed by clinician on day of visit: allergies, medications, problem list, medical history, surgical history, family history, social history, and previous encounter  notes.  I, Water quality scientist, CMA, am acting as Location manager for [employer], DO.  I have reviewed the above documentation for accuracy and completeness, and I agree with the above. Mellody Dance, DO

## 2020-04-21 ENCOUNTER — Encounter (INDEPENDENT_AMBULATORY_CARE_PROVIDER_SITE_OTHER): Payer: Self-pay | Admitting: Physician Assistant

## 2020-04-21 ENCOUNTER — Ambulatory Visit (INDEPENDENT_AMBULATORY_CARE_PROVIDER_SITE_OTHER): Payer: Medicare Other | Admitting: Physician Assistant

## 2020-04-21 ENCOUNTER — Other Ambulatory Visit: Payer: Self-pay

## 2020-04-21 VITALS — BP 95/61 | HR 80 | Temp 97.9°F | Ht 65.0 in | Wt 177.0 lb

## 2020-04-21 DIAGNOSIS — E7849 Other hyperlipidemia: Secondary | ICD-10-CM | POA: Diagnosis not present

## 2020-04-21 DIAGNOSIS — E669 Obesity, unspecified: Secondary | ICD-10-CM | POA: Diagnosis not present

## 2020-04-21 DIAGNOSIS — I10 Essential (primary) hypertension: Secondary | ICD-10-CM

## 2020-04-21 DIAGNOSIS — Z6830 Body mass index (BMI) 30.0-30.9, adult: Secondary | ICD-10-CM

## 2020-04-22 NOTE — Progress Notes (Signed)
Chief Complaint:   Rhonda [name] is here to discuss her progress with her Rhonda treatment plan along with follow-up of her Rhonda related diagnoses. Rhonda [name] is on the Category 1 Plan and states she is following her eating plan approximately 65% of the time. Rhonda [name] states she is exercising 0 minutes 0 times per week.  Today's visit was #: 3 Starting weight: 184 lbs Starting date: 03/24/2020 Today's weight: 177 lbs Today's date: 04/21/2020 Total lbs lost to date: 7 Total lbs lost since last in-office visit: 3  Interim History: Rhonda [name] reports that this will most likely be the last time she comes because she feels like she can do it on her own. She feels restricted in what she can eat.  Subjective:   Other hyperlipidemia. Rhonda [name] is seeing her cardiologist who is managing her LDL's. She is on Crestor.   Lab Results  Component Value Date   CHOL 193 02/17/2020   HDL 64 02/17/2020   LDLCALC 109 (H) 02/17/2020   TRIG 113 02/17/2020   CHOLHDL 3.0 02/17/2020   Lab Results  Component Value Date   ALT 9 02/17/2020   AST 15 02/17/2020   ALKPHOS 47 (L) 02/17/2020   BILITOT 0.3 02/17/2020   The 10-year ASCVD risk score Rhonda Bussing DC Jr., et al., 2013) is: 17%   Values used to calculate the score:     Age: 79 years     Sex: Female     Is Non-Hispanic African American: No     Diabetic: No     Tobacco smoker: No     Systolic Blood Pressure: 95 mmHg     Is BP treated: Yes     HDL Cholesterol: 64 mg/dL     Total Cholesterol: 193 mg/dL  Essential hypertension. Blood pressure is slightly low today. No lightheadedness or dizziness. Her PCP is managing and she is taking medication to keep blood pressure in the normal range. She also follows with Cardiology.  BP Readings from Last 3 Encounters:  04/21/20 95/61  04/07/20 (!) 157/85  03/24/20 (!) 149/83   Lab Results  Component Value Date   CREATININE 0.74 02/17/2020   CREATININE 0.73 05/27/2019   CREATININE 0.74 05/01/2018    Assessment/Plan:   Other hyperlipidemia. Cardiovascular risk and specific lipid/LDL goals reviewed.  We discussed several lifestyle modifications today and Rhonda [name] will continue to work on diet, exercise and weight loss efforts. Orders and follow up as documented in patient record. She will follow-up with Cardiology the end of this month as scheduled.  Counseling Intensive lifestyle modifications are the first line treatment for this issue. . Dietary changes: Increase soluble fiber. Decrease simple carbohydrates. . Exercise changes: Moderate to vigorous-intensity aerobic activity 150 minutes per week if tolerated. . Lipid-lowering medications: see documented in medical record.  Essential hypertension. Rhonda [name] is working on healthy weight loss and exercise to improve blood pressure control. We will watch for signs of hypotension as she continues her lifestyle modifications. She will follow-up with her PCP regarding her low blood pressure.  Class 1 Rhonda with serious comorbidity and body mass index (BMI) of 30.0 to 30.9 in adult, unspecified Rhonda type - BMI above 30 at the satrt of program.  Rhonda [name] is currently in the action stage of change. As such, her goal is to continue with weight loss efforts. She has agreed to keeping a food journal and adhering to recommended goals of 1000 calories and 70 grams of protein daily.   Exercise goals: Older adults  should follow the adult guidelines. When older adults cannot meet the adult guidelines, they should be as physically active as their abilities and conditions will allow.   Behavioral modification strategies: increasing lean protein intake and no skipping meals.  Rhonda [name] has agreed to follow-up with our clinic in 4 weeks. She was informed of the importance of frequent follow-up visits to maximize her success with intensive lifestyle modifications for her multiple health conditions.   Objective:   Blood pressure 95/61, pulse 80, temperature 97.9 F  (36.6 C), height 5\' 5"  (1.651 m), weight 177 lb (80.3 kg), SpO2 95 %. Body mass index is 29.45 kg/m.  General: Cooperative, alert, well developed, in no acute distress. HEENT: Conjunctivae and lids unremarkable. Cardiovascular: Regular rhythm.  Lungs: Normal work of breathing. Neurologic: No focal deficits.   Lab Results  Component Value Date   CREATININE 0.74 02/17/2020   BUN 10 02/17/2020   NA 140 02/17/2020   K 4.3 02/17/2020   CL 107 (H) 02/17/2020   CO2 23 02/17/2020   Lab Results  Component Value Date   ALT 9 02/17/2020   AST 15 02/17/2020   ALKPHOS 47 (L) 02/17/2020   BILITOT 0.3 02/17/2020   Lab Results  Component Value Date   HGBA1C 5.6 03/24/2020   Lab Results  Component Value Date   INSULIN 15.6 03/24/2020   Lab Results  Component Value Date   TSH 2.310 03/24/2020   Lab Results  Component Value Date   CHOL 193 02/17/2020   HDL 64 02/17/2020   LDLCALC 109 (H) 02/17/2020   TRIG 113 02/17/2020   CHOLHDL 3.0 02/17/2020   Lab Results  Component Value Date   WBC 5.2 02/17/2020   HGB 12.3 02/17/2020   HCT 36.9 02/17/2020   MCV 92 02/17/2020   PLT 229 02/17/2020   No results found for: IRON, TIBC, FERRITIN  Rhonda Behavioral Intervention:   Approximately 15 minutes were spent on the discussion below.  ASK: We discussed the diagnosis of Rhonda with Rhonda [name] today and Rhonda [name] agreed to give [place] permission to discuss Rhonda behavioral modification therapy today.  ASSESS: Rhonda [name] has the diagnosis of Rhonda and her BMI today is 29.5. Rhonda [name] is in the action stage of change.   ADVISE: Rhonda [name] was educated on the multiple health risks of Rhonda as well as the benefit of weight loss to improve her health. She was advised of the need for long term treatment and the importance of lifestyle modifications to improve her current health and to decrease her risk of future health problems.  AGREE: Multiple dietary modification options and treatment options were  discussed and Rhonda [name] agreed to follow the recommendations documented in the above note.  ARRANGE: Rhonda [name] was educated on the importance of frequent visits to treat Rhonda as outlined per CMS and USPSTF guidelines and agreed to schedule her next follow up appointment today.  Attestation Statements:   Reviewed by clinician on day of visit: allergies, medications, problem list, medical history, surgical history, family history, social history, and previous encounter notes.  IMichaelene Song, am acting as transcriptionist for Rhonda Potash, PA-C   I have reviewed the above documentation for accuracy and completeness, and I agree with the above. Rhonda Potash, PA-C

## 2020-05-05 ENCOUNTER — Ambulatory Visit: Payer: Medicare Other | Admitting: Cardiology

## 2020-05-05 ENCOUNTER — Encounter: Payer: Self-pay | Admitting: Cardiology

## 2020-05-05 ENCOUNTER — Other Ambulatory Visit: Payer: Self-pay

## 2020-05-05 VITALS — BP 100/68 | HR 68 | Ht 65.0 in | Wt 179.2 lb

## 2020-05-05 DIAGNOSIS — I951 Orthostatic hypotension: Secondary | ICD-10-CM

## 2020-05-05 DIAGNOSIS — I6523 Occlusion and stenosis of bilateral carotid arteries: Secondary | ICD-10-CM

## 2020-05-05 DIAGNOSIS — I4819 Other persistent atrial fibrillation: Secondary | ICD-10-CM

## 2020-05-05 DIAGNOSIS — I10 Essential (primary) hypertension: Secondary | ICD-10-CM | POA: Diagnosis not present

## 2020-05-05 DIAGNOSIS — R0602 Shortness of breath: Secondary | ICD-10-CM

## 2020-05-05 MED ORDER — PYRIDOSTIGMINE BROMIDE 60 MG PO TABS: 30.0000 mg | ORAL_TABLET | Freq: Every day | ORAL | 3 refills | Status: DC

## 2020-05-05 NOTE — Progress Notes (Signed)
Cardiology Office Note    Date:  05/07/2020   ID:  Rhonda [name], DOB 24-Jun-1941, MRN [id number]  PCP:  Lavone Orn, MD  Cardiologist: Fransico Him, MD EPS: None  Chief Complaint  Patient presents with  . Follow-up    orthostatic hypotension, afib, PVCs, atrial tachycardia, OSA    History of Present Illness:  Rhonda [name] is a 79 y.o. female with a hx of persistent AF s/p afib ablation 12 years ago,  bilateral carotid stenosis < 50%, PVC's, PAC's and nonsustained atrial tachycardia. She has been followed in afib clinic with Dr. Rayann Heman..   Patient did not want repeat ablation and is now on flecainide.  She has a CHADS2VASC score of 4 and is on Eliquis for anticoagulation.     She underwent home sleep study showing severe sleep apnea with an AHI of 38.1/h.  She underwent CPAP titration but could not be adequately titrated due to ongoing respiratory events.  A BiPAP titration was recommended but patient refused any further studies and stated that she was not going to use a Pap device.   She went back to see Dr. Ola Spurr who had done her original ablation and she had a stress test and Ziopatch.  The stress test was fine and he placed her on Flecainide.     She saw me 02/13/2020 was having problems with orthostatic hypotension and dizziness despite being placed on Florinef 0.1mg  daily which had helped her symptoms but she was still having symptoms so was given prescription for abdominal binder and thigh-high compression hose.   She was seen back by [name], PA and was doing better .  She was wearing compression stockings but couldn't get the abdominal binder to fit so she got a girdle type from [employer].   She is here today for followup and is doing well.  She denies any chest pain or pressure, PND, orthopnea, LE edema, palpitations or syncope. She still has some episodes of dizziness if she has been standing for long periods of time outside or when cooking but has not had any  syncope and if she gets dizzy she sits down.  She feels out of shape and gets DOE some but attributes it to deconditioning from sedentary state.  She is compliant with her meds and is tolerating meds with no SE.    Past Medical History:  Diagnosis Date  . Abscess 2021   abdominal cyst, ruptured, treated with Doxycycline   . Adenomatous colon polyp    cecum, last colonoscopy 2006  . Atrial tachycardia ([place])    non sustatined with PVCs and PACs  . B12 deficiency 03/19/2020  . Bacterial meningitis    as a child  . Basilar migraine   . Carotid stenosis, bilateral 10/13/2016   1-39% bilateral by dopplers 10/2016  . Cystocele    MIld  . History of hemorrhoids   . Hx of chest pain    Atypical  . Hx of hematuria    IVP 1990 left kidney stone, cystoscopy negative  . Hyperlipidemia   . Hyperlipidemia LDL goal <70 10/26/2017  . Hypertension   . Joint pain   . Muscle weakness (generalized) 03/19/2020  . Nephrolithiasis   . OSA (obstructive sleep apnea) 10/26/2017   Severe with AHI 38/hr.  Patient refuses PAP therapy  . Osteoporosis    actonel since 2006  . PAF (paroxysmal atrial fibrillation) ([place])    radiofrequency ablation of afib [facility]  . Right facial numbness 5/12  question TIA versus migraine,cardiac monitoring neg for AFIB  . Seizures ([place]) 1980   7 years on Dilantin  . Snoring   . SOB (shortness of breath)    Chronic mild, negative cardiolite  . Tinea versicolor     Past Surgical History:  Procedure Laterality Date  . ABLATION     Heart  . CHOLECYSTECTOMY    . multiple ankle surgeries      Current Medications: Current Meds  Medication Sig  . acetaminophen (TYLENOL) 325 MG tablet Take 650 mg by mouth every 6 (six) hours as needed for mild pain.  Marland Kitchen alendronate (FOSAMAX) 70 MG tablet Take 70 mg by mouth once a week. Take with a full glass of water on an empty stomach.  . Ascorbic Acid (VITAMIN C PO) Take 1 tablet by mouth daily.  . Cholecalciferol (VITAMIN D3 PO)  Take 625 mg by mouth daily.  Marland Kitchen diltiazem (CARDIZEM) 30 MG tablet Take 30 mg by mouth every 4 (four) hours as needed (afib HR >100).  Marland Kitchen ELIQUIS 5 MG TABS tablet TAKE 1 TABLET BY MOUTH  TWICE DAILY  . flecainide (TAMBOCOR) 50 MG tablet TAKE 1 TABLET BY MOUTH  TWICE DAILY  . fludrocortisone (FLORINEF) 0.1 MG tablet Take 100 mcg by mouth 2 (two) times daily.   . Loratadine (CLARITIN PO) Take 1 tablet by mouth daily as needed (allergies).   Marland Kitchen omeprazole (PRILOSEC OTC) 20 MG tablet Take 20 mg by mouth as needed.  . rosuvastatin (CRESTOR) 5 MG tablet Take 1 tablet by mouth 3 days a week or as tolerated  . Sennosides (SENOKOT PO) Take 1 tablet by mouth daily as needed.   . vitamin B-12 (CYANOCOBALAMIN) 1000 MCG tablet Take 1,000 mcg by mouth daily.   Current Facility-Administered Medications for the 05/05/20 encounter (Office Visit) with Sueanne Margarita, MD  Medication  . cyanocobalamin ((VITAMIN B-12)) injection 1,000 mcg     Allergies:   Erythromycin   Social History   Socioeconomic History  . Marital status: Married    Spouse name: Not on file  . Number of children: Not on file  . Years of education: Not on file  . Highest education level: Not on file  Occupational History  . Occupation: Retired  Tobacco Use  . Smoking status: Never Smoker  . Smokeless tobacco: Never Used  Vaping Use  . Vaping Use: Never used  Substance and Sexual Activity  . Alcohol use: Not Currently    Comment: 1-2 glasses of wine weekly  . Drug use: No  . Sexual activity: Not on file  Other Topics Concern  . Not on file  Social History Narrative   Lives with spouse   Right handed   Caffeine: 1.5 cups/day   Social Determinants of Health   Financial Resource Strain:   . Difficulty of Paying Living Expenses: Not on file  Food Insecurity:   . Worried About Charity fundraiser in the Last Year: Not on file  . Ran Out of Food in the Last Year: Not on file  Transportation Needs:   . Lack of Transportation  (Medical): Not on file  . Lack of Transportation (Non-Medical): Not on file  Physical Activity:   . Days of Exercise per Week: Not on file  . Minutes of Exercise per Session: Not on file  Stress:   . Feeling of Stress : Not on file  Social Connections:   . Frequency of Communication with Friends and Family: Not on file  . Frequency  of Social Gatherings with Friends and Family: Not on file  . Attends Religious Services: Not on file  . Active Member of Clubs or Organizations: Not on file  . Attends Archivist Meetings: Not on file  . Marital Status: Not on file     Family History:  The patient's   family history includes Heart attack in her father; Parkinson's disease in her mother; Sudden death in her father.   ROS:   Please see the history of present illness.    ROS All other systems reviewed and are negative.   PHYSICAL EXAM:   VS:  BP 100/68 (BP Location: Right Arm, Patient Position: Standing)   Pulse 68   Ht 5\' 5"  (1.651 m)   Wt 179 lb 3.2 oz (81.3 kg)   SpO2 97%   BMI 29.82 kg/m   Physical Exam   GEN: Well nourished, well developed, in no acute distress  Neck: no JVD, carotid bruits, or masses Cardiac:RRR; no murmurs, rubs, or gallops  Respiratory:  clear to auscultation bilaterally, normal work of breathing GI: soft, nontender, nondistended, + BS Ext: without cyanosis, clubbing, or edema, Good distal pulses bilaterally Neuro:  Alert and Oriented x 3 Psych: euthymic mood, full affect  Wt Readings from Last 3 Encounters:  05/05/20 179 lb 3.2 oz (81.3 kg)  04/21/20 177 lb (80.3 kg)  04/07/20 180 lb (81.6 kg)      Studies/Labs Reviewed:   EKG:  EKG is not ordered today.    Recent Labs: 02/17/2020: ALT 9; BUN 10; Creatinine, Ser 0.74; Hemoglobin 12.3; Platelets 229; Potassium 4.3; Sodium 140 03/24/2020: TSH 2.310   Lipid Panel    Component Value Date/Time   CHOL 193 02/17/2020 0946   TRIG 113 02/17/2020 0946   HDL 64 02/17/2020 0946   CHOLHDL 3.0  02/17/2020 0946   LDLCALC 109 (H) 02/17/2020 0946    Additional studies/ records that were reviewed today include:   NST 04/2017 Study Highlights    Nuclear stress EF: 68%.  There was no ST segment deviation noted during stress.  There is a small defect of mild severity present in the basal inferolateral and mid inferolateral location. The defect is reversible. This likely is due to variations in diaphragmatic attenuation artifact but cannot rule out a very small area of ischemia  This is a low risk study.  The left ventricular ejection fraction is hyperdynamic (>65%).    2D echo 06/06/2019 Study Conclusions   - Left ventricle: The cavity size was normal. Wall thickness was   normal. Systolic function was normal. The estimated ejection   fraction was in the range of 55% to 60%. Wall motion was normal;   there were no regional wall motion abnormalities. Doppler   parameters are consistent with abnormal left ventricular   relaxation (grade 1 diastolic dysfunction). The E/e&' ratio is   between 8-15, suggesting indeterminate LV filling pressure. - Aortic valve: Trileaflet. Sclerosis without stenosis. There was   trivial regurgitation. - Mitral valve: Mildly thickened leaflets . There was trivial   regurgitation. - Left atrium: The atrium was normal in size. - Tricuspid valve: There was mild regurgitation. - Pulmonary arteries: PA peak pressure: 30 mm Hg (S). - Inferior vena cava: The vessel was normal in size. The   respirophasic diameter changes were in the normal range (= 50%),   consistent with normal central venous pressure.   Impressions:   - LVEF 55-60%, normal wall thickness, normal wall motion, diastolic   dysfunction, indeterminate  LV filling pressure, aortic valve   sclerosis with trivial AI, normal LA size, mild TR, RVSP 30 mmHg,   normal IVC.  ASSESSMENT:    1. Orthostatic hypotension   2. Persistent atrial fibrillation ([place])   3. Benign essential HTN     4. Carotid stenosis, bilateral   5. SOB (shortness of breath)      PLAN:  In order of problems listed above:  Orthostatic hypotension  -She still has some dizziness but no syncope -her dizziness mainly occurs if she has been standing too long -she is wearing her compression hose and bought some girdles instead of abdominal binder   -orthostatics done today and she has a drop of 58mmhg in BP when standing with some dizziness -would avoid ProAmatine because of systolic blood pressures in the 150s. -continue Florinef 0.1mg  BID -add Mestinon 30mg  daily -she will followup with me in 2 weeks and if still orthostatic then increase Mestinon  Persistent atrial fibrillation  -status post ablation 12 years ago with recurrent PAF  -she has not had any further palpitations after starting on flecainide  -she denies any bleeding problems on Eliquis -continue Eliquis 5mg  BID -SCr normal at 0.740   Essential hypertension  -BP controlled  Carotid artery stenosis  -repeat dopplers 01/2019 stable with 1 to 39% left carotid stenosis  Dyspnea on exertion  -felt secondary to deconditioning and sedentary lifestyle.   -Echo 05/2019 normal LVEF with grade 1 DD  Hyperlipidemia  -LDL goal < 70 -LDL 109 in Aug 2020 -continue Crestor 5mg  3 times weekly -she does not feel well on the statin -she has lost weight So I will recheck FLP and if still elevated then refer to lipid clinic  Obesity -she has been followed in [facility] and has lost 20lbs.    Medication Adjustments/Labs and Tests Ordered: Current medicines are reviewed at length with the patient today.  Concerns regarding medicines are outlined above.  Medication changes, Labs and Tests ordered today are listed in the Patient Instructions below. Patient Instructions  Medication Instructions:  Your physician has recommended you make the following change in your medication: 1) START taking Mestinon 30 mg daily  *If  you need a refill on your cardiac medications before your next appointment, please call your pharmacy*  Follow-Up: At [facility], you and your health needs are our priority.  As part of our continuing mission to provide you with exceptional heart care, we have created designated Provider Care Teams.  These Care Teams include your primary Cardiologist (physician) and Advanced Practice Providers (APPs -  Physician Assistants and Nurse Practitioners) who all work together to provide you with the care you need, when you need it.  Your next appointment:   2 week(s)  The format for your next appointment:   In Person  Provider:   You may see Fransico Him, MD or one of the following Advanced Practice Providers on your designated Care Team:    Melina Copa, PA-C  [name], PA-C      Signed, Fransico Him, MD  05/07/2020 12:59 PM    [address], [place], [place]  [id number] Phone: [phone]; Fax: [phone]

## 2020-05-05 NOTE — Patient Instructions (Signed)
Medication Instructions:  Your physician has recommended you make the following change in your medication: 1) START taking Mestinon 30 mg daily  *If you need a refill on your cardiac medications before your next appointment, please call your pharmacy*  Follow-Up: At [facility], you and your health needs are our priority.  As part of our continuing mission to provide you with exceptional heart care, we have created designated Provider Care Teams.  These Care Teams include your primary Cardiologist (physician) and Advanced Practice Providers (APPs -  Physician Assistants and Nurse Practitioners) who all work together to provide you with the care you need, when you need it.  Your next appointment:   2 week(s)  The format for your next appointment:   In Person  Provider:   You may see Fransico Him, MD or one of the following Advanced Practice Providers on your designated Care Team:    Melina Copa, PA-C  [name], PA-C

## 2020-05-11 ENCOUNTER — Other Ambulatory Visit: Payer: Self-pay | Admitting: Cardiology

## 2020-05-11 ENCOUNTER — Other Ambulatory Visit: Payer: Medicare Other

## 2020-05-11 ENCOUNTER — Other Ambulatory Visit: Payer: Self-pay

## 2020-05-12 LAB — HEPATIC FUNCTION PANEL
ALT: 9 IU/L (ref 0–32)
AST: 10 IU/L (ref 0–40)
Albumin: 3.9 g/dL (ref 3.7–4.7)
Alkaline Phosphatase: 42 IU/L — ABNORMAL LOW (ref 44–121)
Bilirubin Total: 0.4 mg/dL (ref 0.0–1.2)
Bilirubin, Direct: 0.12 mg/dL (ref 0.00–0.40)
Total Protein: 7.5 g/dL (ref 6.0–8.5)

## 2020-05-12 LAB — LIPID PANEL
Chol/HDL Ratio: 2.3 ratio (ref 0.0–4.4)
Cholesterol, Total: 127 mg/dL (ref 100–199)
HDL: 55 mg/dL (ref >39)
LDL Chol Calc (NIH): 59 mg/dL (ref 0–99)
Triglycerides: 63 mg/dL (ref 0–149)
VLDL Cholesterol Cal: 13 mg/dL (ref 5–40)

## 2020-05-19 ENCOUNTER — Encounter (INDEPENDENT_AMBULATORY_CARE_PROVIDER_SITE_OTHER): Payer: Self-pay | Admitting: Physician Assistant

## 2020-05-19 ENCOUNTER — Other Ambulatory Visit: Payer: Self-pay

## 2020-05-19 ENCOUNTER — Ambulatory Visit (INDEPENDENT_AMBULATORY_CARE_PROVIDER_SITE_OTHER): Payer: Medicare Other | Admitting: Physician Assistant

## 2020-05-19 VITALS — BP 132/83 | HR 63 | Temp 98.0°F | Ht 65.0 in | Wt 172.0 lb

## 2020-05-19 DIAGNOSIS — E7849 Other hyperlipidemia: Secondary | ICD-10-CM | POA: Diagnosis not present

## 2020-05-19 DIAGNOSIS — E669 Obesity, unspecified: Secondary | ICD-10-CM | POA: Diagnosis not present

## 2020-05-19 DIAGNOSIS — E8881 Metabolic syndrome: Secondary | ICD-10-CM

## 2020-05-19 DIAGNOSIS — Z6830 Body mass index (BMI) 30.0-30.9, adult: Secondary | ICD-10-CM

## 2020-05-19 NOTE — Progress Notes (Signed)
Chief Complaint:   OBESITY Rhonda [name] is here to discuss her progress with her obesity treatment plan along with follow-up of her obesity related diagnoses. Rhonda [name] is keeping a food journal and adhering to recommended goals of 1000 calories and 70 grams of protein and states she is following her eating plan approximately 50% of the time. Rhonda [name] states she is exercising 0 minutes 0 times per week.  Today's visit was #: 4 Starting weight: 184 lbs Starting date: 03/24/2020 Today's weight: 172 lbs Today's date: 05/19/2020 Total lbs lost to date: 12 Total lbs lost since last in-office visit: 5  Interim History: Rhonda [name] states that she has been eating "a lot of things I shouldn't be eating." She did a good job portion controlling her food and getting more protein in. She is mentally keeping track of her food, but not journaling. She wants to lose 5 more lbs.  Subjective:   Other hyperlipidemia. Recent lipid panel showed levels improved after PCP added Lipitor.   Lab Results  Component Value Date   CHOL 127 05/11/2020   HDL 55 05/11/2020   LDLCALC 59 05/11/2020   TRIG 63 05/11/2020   CHOLHDL 2.3 05/11/2020   Lab Results  Component Value Date   ALT 9 05/11/2020   AST 10 05/11/2020   ALKPHOS 42 (L) 05/11/2020   BILITOT 0.4 05/11/2020   The ASCVD Risk score Mikey Bussing DC Jr., et al., 2013) failed to calculate for the following reasons:   The valid total cholesterol range is 130 to 320 mg/dL  Insulin resistance. Rhonda [name] has a diagnosis of insulin resistance based on her elevated fasting insulin level >5. She continues to work on diet and exercise to decrease her risk of diabetes. Rhonda [name] is on no medication and denies polyphagia. She is due for labs at her next visit.  Lab Results  Component Value Date   INSULIN 15.6 03/24/2020   Lab Results  Component Value Date   HGBA1C 5.6 03/24/2020   Assessment/Plan:   Other hyperlipidemia. Cardiovascular risk and specific lipid/LDL goals  reviewed.  We discussed several lifestyle modifications today and Rhonda [name] will continue to work on diet, exercise and weight loss efforts. Orders and follow up as documented in patient record. PCP will continue to monitor.  Counseling Intensive lifestyle modifications are the first line treatment for this issue. . Dietary changes: Increase soluble fiber. Decrease simple carbohydrates. . Exercise changes: Moderate to vigorous-intensity aerobic activity 150 minutes per week if tolerated. . Lipid-lowering medications: see documented in medical record.  Insulin resistance. Rhonda [name] will continue to work on weight loss, exercise, and decreasing simple carbohydrates to help decrease the risk of diabetes. Rhonda [name] agreed to follow-up with [place] as directed to closely monitor her progress. Labs will be checked at her next office visit.  Class 1 obesity with serious comorbidity and body mass index (BMI) of 30.0 to 30.9 in adult, unspecified obesity type.  Rhonda [name] is currently in the action stage of change. As such, her goal is to continue with weight loss efforts. She has agreed to keeping a food journal and adhering to recommended goals of 1000 calories and 70 grams of protein daily.  Labs will be checked at the time of her next visit.  Exercise goals: Older adults should follow the adult guidelines. When older adults cannot meet the adult guidelines, they should be as physically active as their abilities and conditions will allow.   Behavioral modification strategies: meal planning and cooking strategies and keeping healthy foods in  the home.  Rhonda [name] has agreed to follow-up with our clinic in 6 weeks. She was informed of the importance of frequent follow-up visits to maximize her success with intensive lifestyle modifications for her multiple health conditions.   Objective:   Blood pressure 132/83, pulse 63, temperature 98 F (36.7 C), height 5\' 5"  (1.651 m), weight 172 lb (78 kg). Body mass index is 28.62  kg/m.  General: Cooperative, alert, well developed, in no acute distress. HEENT: Conjunctivae and lids unremarkable. Cardiovascular: Regular rhythm.  Lungs: Normal work of breathing. Neurologic: No focal deficits.   Lab Results  Component Value Date   CREATININE 0.74 02/17/2020   BUN 10 02/17/2020   NA 140 02/17/2020   K 4.3 02/17/2020   CL 107 (H) 02/17/2020   CO2 23 02/17/2020   Lab Results  Component Value Date   ALT 9 05/11/2020   AST 10 05/11/2020   ALKPHOS 42 (L) 05/11/2020   BILITOT 0.4 05/11/2020   Lab Results  Component Value Date   HGBA1C 5.6 03/24/2020   Lab Results  Component Value Date   INSULIN 15.6 03/24/2020   Lab Results  Component Value Date   TSH 2.310 03/24/2020   Lab Results  Component Value Date   CHOL 127 05/11/2020   HDL 55 05/11/2020   LDLCALC 59 05/11/2020   TRIG 63 05/11/2020   CHOLHDL 2.3 05/11/2020   Lab Results  Component Value Date   WBC 5.2 02/17/2020   HGB 12.3 02/17/2020   HCT 36.9 02/17/2020   MCV 92 02/17/2020   PLT 229 02/17/2020   No results found for: IRON, TIBC, FERRITIN  Obesity Behavioral Intervention:   Approximately 15 minutes were spent on the discussion below.  ASK: We discussed the diagnosis of obesity with Rhonda [name] today and Rhonda [name] agreed to give [place] permission to discuss obesity behavioral modification therapy today.  ASSESS: Rhonda [name] has the diagnosis of obesity and her BMI today is 28.7. Rhonda [name] is in the action stage of change.   ADVISE: Rhonda [name] was educated on the multiple health risks of obesity as well as the benefit of weight loss to improve her health. She was advised of the need for long term treatment and the importance of lifestyle modifications to improve her current health and to decrease her risk of future health problems.  AGREE: Multiple dietary modification options and treatment options were discussed and Rhonda [name] agreed to follow the recommendations documented in the above note.  ARRANGE: Rhonda [name] was  educated on the importance of frequent visits to treat obesity as outlined per CMS and USPSTF guidelines and agreed to schedule her next follow up appointment today.  Attestation Statements:   Reviewed by clinician on day of visit: allergies, medications, problem list, medical history, surgical history, family history, social history, and previous encounter notes.  Rhonda [name], am acting as transcriptionist for Abby Potash, PA-C   I have reviewed the above documentation for accuracy and completeness, and I agree with the above. Abby Potash, PA-C

## 2020-05-20 ENCOUNTER — Encounter: Payer: Self-pay | Admitting: Cardiology

## 2020-05-20 ENCOUNTER — Ambulatory Visit: Payer: Medicare Other | Admitting: Cardiology

## 2020-05-20 VITALS — BP 112/77 | HR 68 | Ht 65.0 in | Wt 177.0 lb

## 2020-05-20 DIAGNOSIS — I951 Orthostatic hypotension: Secondary | ICD-10-CM

## 2020-05-20 DIAGNOSIS — I4819 Other persistent atrial fibrillation: Secondary | ICD-10-CM | POA: Diagnosis not present

## 2020-05-20 DIAGNOSIS — I6523 Occlusion and stenosis of bilateral carotid arteries: Secondary | ICD-10-CM

## 2020-05-20 DIAGNOSIS — R0602 Shortness of breath: Secondary | ICD-10-CM

## 2020-05-20 DIAGNOSIS — E785 Hyperlipidemia, unspecified: Secondary | ICD-10-CM

## 2020-05-20 DIAGNOSIS — I10 Essential (primary) hypertension: Secondary | ICD-10-CM | POA: Diagnosis not present

## 2020-05-20 MED ORDER — APIXABAN 5 MG PO TABS
5.0000 mg | ORAL_TABLET | Freq: Two times a day (BID) | ORAL | 1 refills | Status: DC
Start: 2020-05-20 — End: 2021-01-26

## 2020-05-20 MED ORDER — ROSUVASTATIN CALCIUM 5 MG PO TABS: ORAL_TABLET | ORAL | 3 refills | Status: DC

## 2020-05-20 MED ORDER — PYRIDOSTIGMINE BROMIDE 60 MG PO TABS: 30.0000 mg | ORAL_TABLET | Freq: Two times a day (BID) | ORAL | 3 refills | Status: DC

## 2020-05-20 NOTE — Progress Notes (Signed)
Cardiology Office Note    Date:  05/20/2020   ID:  Rhonda [name], DOB [birth year], MRN [id number]  PCP:  Lavone Orn, MD  Cardiologist: Fransico Him, MD EPS: None  Chief Complaint  Patient presents with  . Follow-up    orthostatic hypotension    History of Present Illness:  Rhonda [name] is a 79 y.o. female with a hx of persistent AF s/p afib ablation 12 years ago,  bilateral carotid stenosis < 50%, PVC's, PAC's and nonsustained atrial tachycardia. She has been followed in afib clinic with Dr. Rayann Heman..   Patient did not want repeat ablation and is now on flecainide.  She has a CHADS2VASC score of 4 and is on Eliquis for anticoagulation.     She underwent home sleep study showing severe sleep apnea with an AHI of 38.1/h.  She underwent CPAP titration but could not be adequately titrated due to ongoing respiratory events.  A BiPAP titration was recommended but patient refused any further studies and stated that she was not going to use a Pap device.   She went back to see Dr. Ola Spurr who had done her original ablation and she had a stress test and Ziopatch.  The stress test was fine and he placed her on Flecainide.     She saw me 02/13/2020 was having problems with orthostatic hypotension and dizziness despite being placed on Florinef 0.1mg  daily which had helped her symptoms but she was still having symptoms so was given prescription for abdominal binder and thigh-high compression hose.   She was seen back by [name], PA and was doing better .  She was wearing compression stockings but couldn't get the abdominal binder to fit so she got a girdle type from [employer].   She saw me a few weeks ago and was having significant problems with orthostatic hypotension with a 41OINO drop in systolic BP with standing.  Due to baseline HTN we wanted to avoid Proamatine due to possible supine HTN.  She was continued on Florinef and Mestinon was added.    She is here today for followup and is  doing well.  She denies any chest pain or pressure, SOB, DOE, PND, orthopnea, LE edema, palpitations or syncope. She has only had 1 episode of dizziness that occurred while out shopping but otherwise has felt good on the Mestinon.  She is compliant with her meds and is tolerating meds with no SE.    Past Medical History:  Diagnosis Date  . Abscess 2021   abdominal cyst, ruptured, treated with Doxycycline   . Adenomatous colon polyp    cecum, last colonoscopy 2006  . Atrial tachycardia ([place])    non sustatined with PVCs and PACs  . B12 deficiency 03/19/2020  . Bacterial meningitis    as a child  . Basilar migraine   . Carotid stenosis, bilateral 10/13/2016   1-39% bilateral by dopplers 10/2016  . Cystocele    MIld  . History of hemorrhoids   . Hx of chest pain    Atypical  . Hx of hematuria    IVP 1990 left kidney stone, cystoscopy negative  . Hyperlipidemia   . Hyperlipidemia LDL goal <70 10/26/2017  . Hypertension   . Joint pain   . Muscle weakness (generalized) 03/19/2020  . Nephrolithiasis   . OSA (obstructive sleep apnea) 10/26/2017   Severe with AHI 38/hr.  Patient refuses PAP therapy  . Osteoporosis    actonel since 2006  . PAF (paroxysmal atrial fibrillation) (  [place])    radiofrequency ablation of afib [facility]  . Right facial numbness 5/12   question TIA versus migraine,cardiac monitoring neg for AFIB  . Seizures ([place]) 1980   7 years on Dilantin  . Snoring   . SOB (shortness of breath)    Chronic mild, negative cardiolite  . Tinea versicolor     Past Surgical History:  Procedure Laterality Date  . ABLATION     Heart  . CHOLECYSTECTOMY    . multiple ankle surgeries      Current Medications: Current Meds  Medication Sig  . acetaminophen (TYLENOL) 325 MG tablet Take 650 mg by mouth every 6 (six) hours as needed for mild pain.  Marland Kitchen alendronate (FOSAMAX) 70 MG tablet Take 70 mg by mouth once a week. Take with a full glass of water on an empty stomach.  . Ascorbic  Acid (VITAMIN C PO) Take 1 tablet by mouth daily.  . Cholecalciferol (VITAMIN D3 PO) Take 625 mg by mouth daily.  Marland Kitchen diltiazem (CARDIZEM) 30 MG tablet Take 30 mg by mouth every 4 (four) hours as needed (afib HR >100).  Marland Kitchen ELIQUIS 5 MG TABS tablet TAKE 1 TABLET BY MOUTH  TWICE DAILY  . flecainide (TAMBOCOR) 50 MG tablet TAKE 1 TABLET BY MOUTH  TWICE DAILY  . fludrocortisone (FLORINEF) 0.1 MG tablet Take 100 mcg by mouth 2 (two) times daily.   . Loratadine (CLARITIN PO) Take 1 tablet by mouth daily as needed (allergies).   Marland Kitchen omeprazole (PRILOSEC OTC) 20 MG tablet Take 20 mg by mouth as needed.  . rosuvastatin (CRESTOR) 5 MG tablet Take 1 tablet by mouth 3 days a week or as tolerated  . Sennosides (SENOKOT PO) Take 1 tablet by mouth daily as needed.   . vitamin B-12 (CYANOCOBALAMIN) 1000 MCG tablet Take 1,000 mcg by mouth daily.  . [DISCONTINUED] pyridostigmine (MESTINON) 60 MG tablet Take 0.5 tablets (30 mg total) by mouth daily.   Current Facility-Administered Medications for the 05/20/20 encounter (Office Visit) with Sueanne Margarita, MD  Medication  . cyanocobalamin ((VITAMIN B-12)) injection 1,000 mcg     Allergies:   Erythromycin   Social History   Socioeconomic History  . Marital status: Married    Spouse name: Not on file  . Number of children: Not on file  . Years of education: Not on file  . Highest education level: Not on file  Occupational History  . Occupation: Retired  Tobacco Use  . Smoking status: Never Smoker  . Smokeless tobacco: Never Used  Vaping Use  . Vaping Use: Never used  Substance and Sexual Activity  . Alcohol use: Not Currently    Comment: 1-2 glasses of wine weekly  . Drug use: No  . Sexual activity: Not on file  Other Topics Concern  . Not on file  Social History Narrative   Lives with spouse   Right handed   Caffeine: 1.5 cups/day   Social Determinants of Health   Financial Resource Strain:   . Difficulty of Paying Living Expenses: Not on file   Food Insecurity:   . Worried About Charity fundraiser in the Last Year: Not on file  . Ran Out of Food in the Last Year: Not on file  Transportation Needs:   . Lack of Transportation (Medical): Not on file  . Lack of Transportation (Non-Medical): Not on file  Physical Activity:   . Days of Exercise per Week: Not on file  . Minutes of Exercise per  Session: Not on file  Stress:   . Feeling of Stress : Not on file  Social Connections:   . Frequency of Communication with Friends and Family: Not on file  . Frequency of Social Gatherings with Friends and Family: Not on file  . Attends Religious Services: Not on file  . Active Member of Clubs or Organizations: Not on file  . Attends Archivist Meetings: Not on file  . Marital Status: Not on file     Family History:  The patient's   family history includes Heart attack in her father; Parkinson's disease in her mother; Sudden death in her father.   ROS:   Please see the history of present illness.    ROS All other systems reviewed and are negative.   PHYSICAL EXAM:   VS:  BP 112/77 (Patient Position: Standing) Comment (Patient Position): standing for 2 min  Pulse 68   Ht 5\' 5"  (1.651 m)   Wt 177 lb (80.3 kg)   BMI 29.45 kg/m    Physical Exam    GEN: Well nourished, well developed in no acute distress HEENT: Normal NECK: No JVD; No carotid bruits LYMPHATICS: No lymphadenopathy CARDIAC:RRR, no murmurs, rubs, gallops RESPIRATORY:  Clear to auscultation without rales, wheezing or rhonchi  ABDOMEN: Soft, non-tender, non-distended MUSCULOSKELETAL:  No edema; No deformity  SKIN: Warm and dry NEUROLOGIC:  Alert and oriented x 3 PSYCHIATRIC:  Normal affect    Wt Readings from Last 3 Encounters:  05/20/20 177 lb (80.3 kg)  05/19/20 172 lb (78 kg)  05/05/20 179 lb 3.2 oz (81.3 kg)      Studies/Labs Reviewed:   EKG:  EKG is not ordered today.    Recent Labs: 02/17/2020: BUN 10; Creatinine, Ser 0.74; Hemoglobin  12.3; Platelets 229; Potassium 4.3; Sodium 140 03/24/2020: TSH 2.310 05/11/2020: ALT 9   Lipid Panel    Component Value Date/Time   CHOL 127 05/11/2020 0924   TRIG 63 05/11/2020 0924   HDL 55 05/11/2020 0924   CHOLHDL 2.3 05/11/2020 0924   LDLCALC 59 05/11/2020 0924    Additional studies/ records that were reviewed today include:   NST 04/2017 Study Highlights    Nuclear stress EF: 68%.  There was no ST segment deviation noted during stress.  There is a small defect of mild severity present in the basal inferolateral and mid inferolateral location. The defect is reversible. This likely is due to variations in diaphragmatic attenuation artifact but cannot rule out a very small area of ischemia  This is a low risk study.  The left ventricular ejection fraction is hyperdynamic (>65%).    2D echo 06/06/2019 Study Conclusions   - Left ventricle: The cavity size was normal. Wall thickness was   normal. Systolic function was normal. The estimated ejection   fraction was in the range of 55% to 60%. Wall motion was normal;   there were no regional wall motion abnormalities. Doppler   parameters are consistent with abnormal left ventricular   relaxation (grade 1 diastolic dysfunction). The E/e&' ratio is   between 8-15, suggesting indeterminate LV filling pressure. - Aortic valve: Trileaflet. Sclerosis without stenosis. There was   trivial regurgitation. - Mitral valve: Mildly thickened leaflets . There was trivial   regurgitation. - Left atrium: The atrium was normal in size. - Tricuspid valve: There was mild regurgitation. - Pulmonary arteries: PA peak pressure: 30 mm Hg (S). - Inferior vena cava: The vessel was normal in size. The   respirophasic diameter  changes were in the normal range (= 50%),   consistent with normal central venous pressure.   Impressions:   - LVEF 55-60%, normal wall thickness, normal wall motion, diastolic   dysfunction, indeterminate LV filling  pressure, aortic valve   sclerosis with trivial AI, normal LA size, mild TR, RVSP 30 mmHg,   normal IVC.  ASSESSMENT:    1. Orthostatic hypotension   2. Persistent atrial fibrillation ([place])   3. Benign essential HTN   4. Carotid stenosis, bilateral   5. SOB (shortness of breath)   6. Hyperlipidemia LDL goal <70      PLAN:  In order of problems listed above:  Orthostatic hypotension  -She still has some dizziness but no syncope -her dizziness mainly occurs if she has been standing too long -she is wearing her compression hose and bought some girdles instead of abdominal binder   -orthostatics done at last OV showed a drop of 17mmhg in BP when standing with some dizziness -would avoid ProAmatine because of systolic blood pressures in the 150s. -continue Florinef 0.1mg  BID -orthostatics today are improved and less dizziness -will increase Mestinon to 30mg  BID (take at 9am and 4PM) and followup with me in 4 weeks  Persistent atrial fibrillation  -status post ablation 12 years ago with recurrent PAF  -denies any palpitations on flecainide -she denies any bleeding problems on Eliquis -continue Eliquis 5mg  BID -SCr normal at 0.78 in Oct 2021 and Hbg 12.3 in Aug 2021  Essential hypertension  -BP controlled  Carotid artery stenosis  -repeat dopplers 01/2019 stable with 1 to 39% left carotid stenosis -repeat dopplers 01/2021  Dyspnea on exertion  -felt secondary to deconditioning and sedentary lifestyle.   -Echo 05/2019 normal LVEF with grade 1 DD  Hyperlipidemia  -LDL goal < 70 -LDL 59 in Oct 2021 -she wants to cut back on dosing because she thinks it makes her feel bad -decrease Crestor to 5mg  2  times weekly -repeat FLP in 8 weeks  Medication Adjustments/Labs and Tests Ordered: Current medicines are reviewed at length with the patient today.  Concerns regarding medicines are outlined above.  Medication changes, Labs and Tests ordered today are listed in the Patient  Instructions below. There are no Patient Instructions on file for this visit.   Signed, Fransico Him, MD  05/20/2020 2:07 PM    [address], [place], [place]  [address] Phone: [phone]; Fax: [phone]

## 2020-05-20 NOTE — Patient Instructions (Signed)
Medication Instructions:  Your physician has recommended you make the following change in your medication:  1) INCREASE Mestinon 30 mg twice daily 2) DECREASE Crestor (rosuvastatin) to 5 mg twice per week  *If you need a refill on your cardiac medications before your next appointment, please call your pharmacy*   Lab Work: Fasting lipids in 8 weeks  If you have labs (blood work) drawn today and your tests are completely normal, you will receive your results only by: Marland Kitchen MyChart Message (if you have MyChart) OR . A paper copy in the mail If you have any lab test that is abnormal or we need to change your treatment, we will call you to review the results.  Follow-Up: At [facility], you and your health needs are our priority.  As part of our continuing mission to provide you with exceptional heart care, we have created designated Provider Care Teams.  These Care Teams include your primary Cardiologist (physician) and Advanced Practice Providers (APPs -  Physician Assistants and Nurse Practitioners) who all work together to provide you with the care you need, when you need it.  Your next appointment:   4 week(s)  The format for your next appointment:   In Person  Provider:   Fransico Him, MD

## 2020-05-20 NOTE — Addendum Note (Signed)
Addended by: Antonieta Iba on: 05/20/2020 02:47 PM   Modules accepted: Orders

## 2020-06-24 ENCOUNTER — Ambulatory Visit: Payer: Medicare Other | Admitting: Cardiology

## 2020-06-24 ENCOUNTER — Encounter: Payer: Self-pay | Admitting: Cardiology

## 2020-06-24 ENCOUNTER — Other Ambulatory Visit: Payer: Self-pay

## 2020-06-24 VITALS — BP 163/87 | HR 60 | Ht 65.0 in | Wt 170.5 lb

## 2020-06-24 DIAGNOSIS — I951 Orthostatic hypotension: Secondary | ICD-10-CM

## 2020-06-24 DIAGNOSIS — R0602 Shortness of breath: Secondary | ICD-10-CM

## 2020-06-24 DIAGNOSIS — E785 Hyperlipidemia, unspecified: Secondary | ICD-10-CM

## 2020-06-24 DIAGNOSIS — I4819 Other persistent atrial fibrillation: Secondary | ICD-10-CM

## 2020-06-24 DIAGNOSIS — I6523 Occlusion and stenosis of bilateral carotid arteries: Secondary | ICD-10-CM | POA: Diagnosis not present

## 2020-06-24 DIAGNOSIS — I10 Essential (primary) hypertension: Secondary | ICD-10-CM | POA: Diagnosis not present

## 2020-06-24 NOTE — Addendum Note (Signed)
Addended by: Antonieta Iba on: 06/24/2020 01:59 PM   Modules accepted: Orders

## 2020-06-24 NOTE — Progress Notes (Signed)
Cardiology Office Note    Date:  06/24/2020   ID:  ESRA FRANKOWSKI, DOB [birth year], MRN [id number]  PCP:  Lavone Orn, MD  Cardiologist: Fransico Him, MD EPS: None  Chief Complaint  Patient presents with  . Follow-up    Orthostatic hypotension, atrial fibrillation, HTN    History of Present Illness:  Rhonda [name] is a 79 y.o. female with a hx of persistent AF s/p afib ablation 12 years ago,  bilateral carotid stenosis < 50%, PVC's, PAC's and nonsustained atrial tachycardia. She has been followed in afib clinic with Dr. Rayann Heman..   Patient did not want repeat ablation and is now on flecainide.  She has a CHADS2VASC score of 4 and is on Eliquis for anticoagulation.     She underwent home sleep study showing severe sleep apnea with an AHI of 38.1/h.  She underwent CPAP titration but could not be adequately titrated due to ongoing respiratory events.  A BiPAP titration was recommended but patient refused any further studies and stated that she was not going to use a Pap device.   She went back to see Dr. Ola Spurr who had done her original ablation and she had a stress test and Ziopatch.  The stress test was fine and he placed her on Flecainide.     She saw me 02/13/2020 was having problems with orthostatic hypotension and dizziness despite being placed on Florinef 0.1mg  daily which had helped her symptoms but she was still having symptoms so was given prescription for abdominal binder and thigh-high compression hose.   She was seen back by [name], PA and was doing better .  She was wearing compression stockings but couldn't get the abdominal binder to fit so she got a girdle type from [employer].   She saw me a few weeks ago and was having significant problems with orthostatic hypotension with a 62ZHYQ drop in systolic BP with standing.  Due to baseline HTN we wanted to avoid Proamatine due to possible supine HTN.  She was continued on Florinef and Mestinon was added.  At followup she  was doing much better but still with some orthostasis on exam and I increased Mestinon to 30mg  BID.  She is here today for followup and is doing well.  She has had very few events since I saw her last.  She had one episode of lightheadedness when at the grocery store but resolved quickly.  She had another dizzy spell yesterday while working in the kitchen but had not had her compression hose on.  She denies any chest pain or pressure, SOB, DOE, PND, orthopnea, LE edema or syncope. She is compliant with her meds and is tolerating meds with no SE.  She has refused to wear an abdominal binder.  Her PCP increased her Florinef for low BP and since then she started having more palpitations.  She cut back on the florinef back to 1 tablet daily and cut back on the Mestinon and the palpitations resolved.  Last night her BP was 179/167mmHg which happens sometimes after taking Fosamax.     Past Medical History:  Diagnosis Date  . Abscess 2021   abdominal cyst, ruptured, treated with Doxycycline   . Adenomatous colon polyp    cecum, last colonoscopy 2006  . Atrial tachycardia ([place])    non sustatined with PVCs and PACs  . B12 deficiency 03/19/2020  . Bacterial meningitis    as a child  . Basilar migraine   . Carotid stenosis, bilateral  10/13/2016   1-39% bilateral by dopplers 10/2016  . Cystocele    MIld  . History of hemorrhoids   . Hx of chest pain    Atypical  . Hx of hematuria    IVP 1990 left kidney stone, cystoscopy negative  . Hyperlipidemia   . Hyperlipidemia LDL goal <70 10/26/2017  . Hypertension   . Joint pain   . Muscle weakness (generalized) 03/19/2020  . Nephrolithiasis   . OSA (obstructive sleep apnea) 10/26/2017   Severe with AHI 38/hr.  Patient refuses PAP therapy  . Osteoporosis    actonel since 2006  . PAF (paroxysmal atrial fibrillation) ([place])    radiofrequency ablation of afib [facility]  . Right facial numbness 5/12   question TIA versus migraine,cardiac monitoring neg for  AFIB  . Seizures ([place]) 1980   7 years on Dilantin  . Snoring   . SOB (shortness of breath)    Chronic mild, negative cardiolite  . Tinea versicolor     Past Surgical History:  Procedure Laterality Date  . ABLATION     Heart  . CHOLECYSTECTOMY    . multiple ankle surgeries      Current Medications: Current Meds  Medication Sig  . acetaminophen (TYLENOL) 325 MG tablet Take 650 mg by mouth every 6 (six) hours as needed for mild pain.  Marland Kitchen alendronate (FOSAMAX) 70 MG tablet Take 70 mg by mouth once a week. Take with a full glass of water on an empty stomach.  Marland Kitchen apixaban (ELIQUIS) 5 MG TABS tablet Take 1 tablet (5 mg total) by mouth 2 (two) times daily.  . Ascorbic Acid (VITAMIN C PO) Take 1 tablet by mouth daily.  . Cholecalciferol (VITAMIN D3 PO) Take 625 mg by mouth daily.  Marland Kitchen diltiazem (CARDIZEM) 30 MG tablet Take 30 mg by mouth every 4 (four) hours as needed (afib HR >100).  . flecainide (TAMBOCOR) 50 MG tablet TAKE 1 TABLET BY MOUTH  TWICE DAILY  . fludrocortisone (FLORINEF) 0.1 MG tablet Take 100 mcg by mouth 2 (two) times daily.   . Loratadine (CLARITIN PO) Take 1 tablet by mouth daily as needed (allergies).   Marland Kitchen omeprazole (PRILOSEC OTC) 20 MG tablet Take 20 mg by mouth as needed.  . pyridostigmine (MESTINON) 60 MG tablet Take 0.5 tablets (30 mg total) by mouth 2 (two) times daily.  . rosuvastatin (CRESTOR) 5 MG tablet Take 5 mg (1 tablet) twice weekly  . Sennosides (SENOKOT PO) Take 1 tablet by mouth daily as needed.   . vitamin B-12 (CYANOCOBALAMIN) 1000 MCG tablet Take 1,000 mcg by mouth daily.   Current Facility-Administered Medications for the 06/24/20 encounter (Office Visit) with Sueanne Margarita, MD  Medication  . cyanocobalamin ((VITAMIN B-12)) injection 1,000 mcg     Allergies:   Erythromycin   Social History   Socioeconomic History  . Marital status: Married    Spouse name: Not on file  . Number of children: Not on file  . Years of education: Not on file  .  Highest education level: Not on file  Occupational History  . Occupation: Retired  Tobacco Use  . Smoking status: Never Smoker  . Smokeless tobacco: Never Used  Vaping Use  . Vaping Use: Never used  Substance and Sexual Activity  . Alcohol use: Not Currently    Comment: 1-2 glasses of wine weekly  . Drug use: No  . Sexual activity: Not on file  Other Topics Concern  . Not on file  Social History  Narrative   Lives with spouse   Right handed   Caffeine: 1.5 cups/day   Social Determinants of Health   Financial Resource Strain:   . Difficulty of Paying Living Expenses: Not on file  Food Insecurity:   . Worried About Charity fundraiser in the Last Year: Not on file  . Ran Out of Food in the Last Year: Not on file  Transportation Needs:   . Lack of Transportation (Medical): Not on file  . Lack of Transportation (Non-Medical): Not on file  Physical Activity:   . Days of Exercise per Week: Not on file  . Minutes of Exercise per Session: Not on file  Stress:   . Feeling of Stress : Not on file  Social Connections:   . Frequency of Communication with Friends and Family: Not on file  . Frequency of Social Gatherings with Friends and Family: Not on file  . Attends Religious Services: Not on file  . Active Member of Clubs or Organizations: Not on file  . Attends Archivist Meetings: Not on file  . Marital Status: Not on file     Family History:  The patient's   family history includes Heart attack in her father; Parkinson's disease in her mother; Sudden death in her father.   ROS:   Please see the history of present illness.    ROS All other systems reviewed and are negative.   PHYSICAL EXAM:   VS:  Pulse 60   Ht 5\' 5"  (1.651 m)   Wt 170 lb 8 oz (77.3 kg)   SpO2 96%   BMI 28.37 kg/m    Physical Exam   GEN: Well nourished, well developed in no acute distress HEENT: Normal NECK: No JVD; No carotid bruits LYMPHATICS: No lymphadenopathy CARDIAC:RRR, no  murmurs, rubs, gallops RESPIRATORY:  Clear to auscultation without rales, wheezing or rhonchi  ABDOMEN: Soft, non-tender, non-distended MUSCULOSKELETAL:  No edema; No deformity  SKIN: Warm and dry NEUROLOGIC:  Alert and oriented x 3 PSYCHIATRIC:  Normal affect     Wt Readings from Last 3 Encounters:  06/24/20 170 lb 8 oz (77.3 kg)  05/20/20 177 lb (80.3 kg)  05/19/20 172 lb (78 kg)      Studies/Labs Reviewed:   EKG:  EKG is not ordered today.    Recent Labs: 02/17/2020: BUN 10; Creatinine, Ser 0.74; Hemoglobin 12.3; Platelets 229; Potassium 4.3; Sodium 140 03/24/2020: TSH 2.310 05/11/2020: ALT 9   Lipid Panel    Component Value Date/Time   CHOL 127 05/11/2020 0924   TRIG 63 05/11/2020 0924   HDL 55 05/11/2020 0924   CHOLHDL 2.3 05/11/2020 0924   LDLCALC 59 05/11/2020 0924    Additional studies/ records that were reviewed today include:   NST 04/2017 Study Highlights    Nuclear stress EF: 68%.  There was no ST segment deviation noted during stress.  There is a small defect of mild severity present in the basal inferolateral and mid inferolateral location. The defect is reversible. This likely is due to variations in diaphragmatic attenuation artifact but cannot rule out a very small area of ischemia  This is a low risk study.  The left ventricular ejection fraction is hyperdynamic (>65%).    2D echo 06/06/2019 Study Conclusions   - Left ventricle: The cavity size was normal. Wall thickness was   normal. Systolic function was normal. The estimated ejection   fraction was in the range of 55% to 60%. Wall motion was normal;  there were no regional wall motion abnormalities. Doppler   parameters are consistent with abnormal left ventricular   relaxation (grade 1 diastolic dysfunction). The E/e&' ratio is   between 8-15, suggesting indeterminate LV filling pressure. - Aortic valve: Trileaflet. Sclerosis without stenosis. There was   trivial regurgitation. -  Mitral valve: Mildly thickened leaflets . There was trivial   regurgitation. - Left atrium: The atrium was normal in size. - Tricuspid valve: There was mild regurgitation. - Pulmonary arteries: PA peak pressure: 30 mm Hg (S). - Inferior vena cava: The vessel was normal in size. The   respirophasic diameter changes were in the normal range (= 50%),   consistent with normal central venous pressure.   Impressions:   - LVEF 55-60%, normal wall thickness, normal wall motion, diastolic   dysfunction, indeterminate LV filling pressure, aortic valve   sclerosis with trivial AI, normal LA size, mild TR, RVSP 30 mmHg,   normal IVC.  ASSESSMENT:    1. Orthostatic hypotension   2. Persistent atrial fibrillation ([place])   3. Benign essential HTN   4. Carotid stenosis, bilateral   5. SOB (shortness of breath)   6. Hyperlipidemia LDL goal <70      PLAN:  In order of problems listed above:  Orthostatic hypotension  -She still has some dizziness but no syncope -her dizziness mainly occurs if she has been standing too long -she is wearing her compression hose and bought some girdles instead of abdominal binder>>she has stopped wearing the girdle and does not wear the tighter compression hose at home -orthostatics today showed a drop of almost 19mmhg in BP when standing with some dizziness but decreased her mestinon to only 30mg  once daily after she had palpitations related to when her florinef was increased by her PCP but she took it back down to 0.1mg  daily.   -would avoid ProAmatine because of systolic blood pressures in the 150s. -I am going to stop her florinef due to BP trending upward now to 163/80mmHg today -will increase Mestinon to 30mg  BID (take at 9am and 4PM) and followup with me in 4 weeks -encouraged her to wear her thigh high compression hose daily even at home  Persistent atrial fibrillation  -status post ablation 12 years ago with recurrent PAF  -had some palpitations recently  when PCP increased Florinef but resolved when she cut the dose back -she denies any bleeding problems on Eliquis -continue Eliquis 5mg  BID -SCr normal at 0.78 in Oct 2021 and Hbg 12.3 in Aug 2021  Essential hypertension  -BP elevated today -will stop florinef  Carotid artery stenosis  -repeat dopplers 01/2019 stable with 1 to 39% left carotid stenosis -repeat dopplers 01/2021  Dyspnea on exertion  -felt secondary to deconditioning and sedentary lifestyle.   -Echo 05/2019 normal LVEF with grade 1 DD  Hyperlipidemia  -LDL goal < 70 -LDL 59 in Oct 2021 -continue Crestor to 5mg  2  times weekly -FLP is pending next week  Followup with me in 4 weeks  Medication Adjustments/Labs and Tests Ordered: Current medicines are reviewed at length with the patient today.  Concerns regarding medicines are outlined above.  Medication changes, Labs and Tests ordered today are listed in the Patient Instructions below. There are no Patient Instructions on file for this visit.   Signed, Fransico Him, MD  06/24/2020 1:31 PM    [facility] [place], [place],   [address] Phone: [phone]; Fax: [phone]

## 2020-06-24 NOTE — Patient Instructions (Addendum)
Medication Instructions:  Your physician has recommended you make the following change in your medication:  1) RESTART taking Mestinon 30 mg twice daily  2) STOP taking Florinef   *If you need a refill on your cardiac medications before your next appointment, please call your pharmacy*  Follow-Up: At [facility], you and your health needs are our priority.  As part of our continuing mission to provide you with exceptional heart care, we have created designated Provider Care Teams.  These Care Teams include your primary Cardiologist (physician) and Advanced Practice Providers (APPs -  Physician Assistants and Nurse Practitioners) who all work together to provide you with the care you need, when you need it.  Your next appointment:   4 week(s)  The format for your next appointment:   In Person  Provider:   You may see Fransico Him, MD or one of the following Advanced Practice Providers on your designated Care Team:    Melina Copa, PA-C  [name], PA-C

## 2020-06-30 ENCOUNTER — Encounter (INDEPENDENT_AMBULATORY_CARE_PROVIDER_SITE_OTHER): Payer: Self-pay | Admitting: Physician Assistant

## 2020-06-30 ENCOUNTER — Other Ambulatory Visit: Payer: Self-pay

## 2020-06-30 ENCOUNTER — Ambulatory Visit (INDEPENDENT_AMBULATORY_CARE_PROVIDER_SITE_OTHER): Payer: Medicare Other | Admitting: Physician Assistant

## 2020-06-30 VITALS — BP 93/66 | HR 68 | Temp 97.6°F | Ht 65.0 in | Wt 169.0 lb

## 2020-06-30 DIAGNOSIS — R739 Hyperglycemia, unspecified: Secondary | ICD-10-CM | POA: Diagnosis not present

## 2020-06-30 DIAGNOSIS — E559 Vitamin D deficiency, unspecified: Secondary | ICD-10-CM | POA: Diagnosis not present

## 2020-06-30 DIAGNOSIS — Z6830 Body mass index (BMI) 30.0-30.9, adult: Secondary | ICD-10-CM

## 2020-06-30 DIAGNOSIS — E669 Obesity, unspecified: Secondary | ICD-10-CM

## 2020-06-30 DIAGNOSIS — E7849 Other hyperlipidemia: Secondary | ICD-10-CM | POA: Diagnosis not present

## 2020-06-30 DIAGNOSIS — E66811 Obesity, class 1: Secondary | ICD-10-CM

## 2020-07-01 LAB — COMPREHENSIVE METABOLIC PANEL
ALT: 11 IU/L (ref 0–32)
AST: 11 IU/L (ref 0–40)
Albumin/Globulin Ratio: 0.8 — ABNORMAL LOW (ref 1.2–2.2)
Albumin: 3.5 g/dL — ABNORMAL LOW (ref 3.7–4.7)
Alkaline Phosphatase: 41 IU/L — ABNORMAL LOW (ref 44–121)
BUN/Creatinine Ratio: 19 (ref 12–28)
BUN: 16 mg/dL (ref 8–27)
Bilirubin Total: 0.4 mg/dL (ref 0.0–1.2)
CO2: 25 mmol/L (ref 20–29)
Calcium: 9.4 mg/dL (ref 8.7–10.3)
Chloride: 106 mmol/L (ref 96–106)
Creatinine, Ser: 0.83 mg/dL (ref 0.57–1.00)
GFR calc Af Amer: 78 mL/min/{1.73_m2} (ref >59)
GFR calc non Af Amer: 68 mL/min/{1.73_m2} (ref >59)
Globulin, Total: 4.2 g/dL (ref 1.5–4.5)
Glucose: 77 mg/dL (ref 65–99)
Potassium: 4.7 mmol/L (ref 3.5–5.2)
Sodium: 141 mmol/L (ref 134–144)
Total Protein: 7.7 g/dL (ref 6.0–8.5)

## 2020-07-01 LAB — LIPID PANEL
Chol/HDL Ratio: 2.3 ratio (ref 0.0–4.4)
Cholesterol, Total: 139 mg/dL (ref 100–199)
HDL: 60 mg/dL (ref >39)
LDL Chol Calc (NIH): 62 mg/dL (ref 0–99)
Triglycerides: 92 mg/dL (ref 0–149)
VLDL Cholesterol Cal: 17 mg/dL (ref 5–40)

## 2020-07-01 LAB — VITAMIN D 25 HYDROXY (VIT D DEFICIENCY, FRACTURES): Vit D, 25-Hydroxy: 35.3 ng/mL (ref 30.0–100.0)

## 2020-07-01 LAB — HEMOGLOBIN A1C
Est. average glucose Bld gHb Est-mCnc: 117 mg/dL
Hgb A1c MFr Bld: 5.7 % — ABNORMAL HIGH (ref 4.8–5.6)

## 2020-07-01 LAB — INSULIN, RANDOM: INSULIN: 35.7 u[IU]/mL — ABNORMAL HIGH (ref 2.6–24.9)

## 2020-07-01 NOTE — Progress Notes (Signed)
Chief Complaint:   OBESITY Rhonda [name] is here to discuss her progress with her obesity treatment plan along with follow-up of her obesity related diagnoses. Rhonda [name] is keeping a food journal and adhering to recommended goals of 1000 calories and 70 grams of protein and states she is following her eating plan approximately 40% of the time. Rhonda [name] states she is exercising 0 minutes 0 times per week.  Today's visit was #: 5 Starting weight: 184 lbs Starting date: 03/24/2020 Today's weight: 169 lbs Today's date: 06/30/2020 Total lbs lost to date: 15 Total lbs lost since last in-office visit: 3  Interim History: Rhonda [name] states that she is 2 pounds away from her goal, but she is unsure whether or not she will continue to lose or just stay at the same weight. She feels like she can continue eating healthy on her own and she does not want to continue with her visits here.  Subjective:   Hyperglycemia. Rhonda [name] has a history of some elevated blood glucose readings without a diagnosis of diabetes. She denies polyphagia or hypoglycemia.  Other hyperlipidemia. Rhonda [name] is on Crestor twice weekly from her cardiologist. She was taking it 3 times weekly, but her cardiologist changed the frequency after her last labs.   Lab Results  Component Value Date   CHOL 127 05/11/2020   HDL 55 05/11/2020   LDLCALC 59 05/11/2020   TRIG 63 05/11/2020   CHOLHDL 2.3 05/11/2020   Lab Results  Component Value Date   ALT 9 05/11/2020   AST 10 05/11/2020   ALKPHOS 42 (L) 05/11/2020   BILITOT 0.4 05/11/2020   The ASCVD Risk score Rhonda Bussing DC Jr., Rhonda al., 2013) failed to calculate for the following reasons:   The valid total cholesterol range is 130 to 320 mg/dL  Vitamin D deficiency. Rhonda [name] is on no medication. She reports fatigue.   Ref. Range 03/24/2020 14:33  Vitamin D, 25-Hydroxy Latest Ref Range: 30.0 - 100.0 ng/mL 66.6   Assessment/Plan:   Hyperglycemia. Fasting labs will be obtained and results with be  discussed with Rhonda [name] in 2 weeks at her follow up visit. In the meanwhile Rhonda [name] was started on a lower simple carbohydrate diet and will work on weight loss efforts. Labs will be checked today.   Other hyperlipidemia. Cardiovascular risk and specific lipid/LDL goals reviewed.  We discussed several lifestyle modifications today and Rhonda [name] will continue to work on diet, exercise and weight loss efforts. Orders and follow up as documented in patient record. She will follow-up with Cardiology as scheduled.  Counseling Intensive lifestyle modifications are the first line treatment for this issue. . Dietary changes: Increase soluble fiber. Decrease simple carbohydrates. . Exercise changes: Moderate to vigorous-intensity aerobic activity 150 minutes per week if tolerated. . Lipid-lowering medications: see documented in medical record.  Vitamin D deficiency. Low Vitamin D level contributes to fatigue and are associated with obesity, breast, and colon cancer. VITAMIN D 25 Hydroxy (Vit-D Deficiency, Fractures) level will be checked today.   Class 1 obesity with serious comorbidity and body mass index (BMI) of 30.0 to 30.9 in adult, unspecified obesity type - BMI started greater than 30.1.  Rhonda [name] is currently in the action stage of change. As such, her goal is to continue with weight loss efforts. She has agreed to keeping a food journal and adhering to recommended goals of 1000 calories and 70 grams of protein daily.   Exercise goals: Older adults should follow the adult guidelines. When older adults cannot meet  the adult guidelines, they should be as physically active as their abilities and conditions will allow.   Behavioral modification strategies: meal planning and cooking strategies and planning for success.  Rhonda [name] will call to schedule a follow-up appointment. She was informed of the importance of frequent follow-up visits to maximize her success with intensive lifestyle modifications for her multiple  health conditions.   Rhonda [name] was informed we would discuss her lab results at her next visit unless there is a critical issue that needs to be addressed sooner. Rhonda [name] agreed to keep her next visit at the agreed upon time to discuss these results.  Objective:   Blood pressure 93/66, pulse 68, temperature 97.6 F (36.4 C), height 5\' 5"  (1.651 m), weight 169 lb (76.7 kg). Body mass index is 28.12 kg/m.  General: Cooperative, alert, well developed, in no acute distress. HEENT: Conjunctivae and lids unremarkable. Cardiovascular: Regular rhythm.  Lungs: Normal work of breathing. Neurologic: No focal deficits.   Lab Results  Component Value Date   CREATININE 0.74 02/17/2020   BUN 10 02/17/2020   NA 140 02/17/2020   K 4.3 02/17/2020   CL 107 (H) 02/17/2020   CO2 23 02/17/2020   Lab Results  Component Value Date   ALT 9 05/11/2020   AST 10 05/11/2020   ALKPHOS 42 (L) 05/11/2020   BILITOT 0.4 05/11/2020   Lab Results  Component Value Date   HGBA1C 5.6 03/24/2020   Lab Results  Component Value Date   INSULIN 15.6 03/24/2020   Lab Results  Component Value Date   TSH 2.310 03/24/2020   Lab Results  Component Value Date   CHOL 127 05/11/2020   HDL 55 05/11/2020   LDLCALC 59 05/11/2020   TRIG 63 05/11/2020   CHOLHDL 2.3 05/11/2020   Lab Results  Component Value Date   WBC 5.2 02/17/2020   HGB 12.3 02/17/2020   HCT 36.9 02/17/2020   MCV 92 02/17/2020   PLT 229 02/17/2020   No results found for: IRON, TIBC, FERRITIN  Obesity Behavioral Intervention:   Approximately 15 minutes were spent on the discussion below.  ASK: We discussed the diagnosis of obesity with Rhonda [name] today and Rhonda [name] agreed to give [place] permission to discuss obesity behavioral modification therapy today.  ASSESS: Rhonda [name] has the diagnosis of obesity and her BMI today is 28.1. Rhonda [name] is in the action stage of change.   ADVISE: Rhonda [name] was educated on the multiple health risks of obesity as well as the benefit of  weight loss to improve her health. She was advised of the need for long term treatment and the importance of lifestyle modifications to improve her current health and to decrease her risk of future health problems.  AGREE: Multiple dietary modification options and treatment options were discussed and Yanilen agreed to follow the recommendations documented in the above note.  ARRANGE: Rhonda [name] was educated on the importance of frequent visits to treat obesity as outlined per CMS and USPSTF guidelines and agreed to schedule her next follow up appointment today.  Attestation Statements:   Reviewed by clinician on day of visit: allergies, medications, problem list, medical history, surgical history, family history, social history, and previous encounter notes.  Rhonda [name], am acting as transcriptionist for Abby Potash, PA-C   I have reviewed the above documentation for accuracy and completeness, and I agree with the above. Abby Potash, PA-C

## 2020-07-20 ENCOUNTER — Other Ambulatory Visit: Payer: Self-pay | Admitting: Cardiology

## 2020-07-20 DIAGNOSIS — I6523 Occlusion and stenosis of bilateral carotid arteries: Secondary | ICD-10-CM

## 2020-07-20 DIAGNOSIS — I10 Essential (primary) hypertension: Secondary | ICD-10-CM

## 2020-07-20 DIAGNOSIS — E785 Hyperlipidemia, unspecified: Secondary | ICD-10-CM

## 2020-07-20 DIAGNOSIS — G4733 Obstructive sleep apnea (adult) (pediatric): Secondary | ICD-10-CM

## 2020-07-20 DIAGNOSIS — I4819 Other persistent atrial fibrillation: Secondary | ICD-10-CM

## 2020-07-28 ENCOUNTER — Other Ambulatory Visit: Payer: Self-pay

## 2020-07-28 ENCOUNTER — Ambulatory Visit: Payer: Medicare Other | Admitting: Cardiology

## 2020-07-28 ENCOUNTER — Encounter: Payer: Self-pay | Admitting: Cardiology

## 2020-07-28 VITALS — BP 109/68 | HR 66 | Ht 65.0 in | Wt 171.0 lb

## 2020-07-28 DIAGNOSIS — I6523 Occlusion and stenosis of bilateral carotid arteries: Secondary | ICD-10-CM

## 2020-07-28 DIAGNOSIS — E785 Hyperlipidemia, unspecified: Secondary | ICD-10-CM

## 2020-07-28 DIAGNOSIS — I4819 Other persistent atrial fibrillation: Secondary | ICD-10-CM

## 2020-07-28 DIAGNOSIS — R0602 Shortness of breath: Secondary | ICD-10-CM | POA: Diagnosis not present

## 2020-07-28 DIAGNOSIS — I10 Essential (primary) hypertension: Secondary | ICD-10-CM | POA: Diagnosis not present

## 2020-07-28 DIAGNOSIS — I951 Orthostatic hypotension: Secondary | ICD-10-CM | POA: Diagnosis not present

## 2020-07-28 MED ORDER — PYRIDOSTIGMINE BROMIDE 60 MG PO TABS: 60.0000 mg | ORAL_TABLET | Freq: Two times a day (BID) | ORAL | 3 refills | Status: DC

## 2020-07-28 NOTE — Patient Instructions (Signed)
Medication Instructions:  Your physician has recommended you make the following change in your medication:  1) INCREASE Mestinon to 60 mg twice daily    *If you need a refill on your cardiac medications before your next appointment, please call your pharmacy*   Lab Work: Cortisol AM If you have labs (blood work) drawn today and your tests are completely normal, you will receive your results only by: Marland Kitchen MyChart Message (if you have MyChart) OR . A paper copy in the mail If you have any lab test that is abnormal or we need to change your treatment, we will call you to review the results.   Follow-Up: At [facility], you and your health needs are our priority.  As part of our continuing mission to provide you with exceptional heart care, we have created designated Provider Care Teams.  These Care Teams include your primary Cardiologist (physician) and Advanced Practice Providers (APPs -  Physician Assistants and Nurse Practitioners) who all work together to provide you with the care you need, when you need it.  Your next appointment:   4 week(s)  The format for your next appointment:   In Person  Provider:   You may see Fransico Him, MD or one of the following Advanced Practice Providers on your designated Care Team:    Melina Copa, PA-C  [name], PA-C

## 2020-07-28 NOTE — Progress Notes (Signed)
Cardiology Office Note    Date:  07/28/2020   ID:  Rhonda [name], DOB 03-28-1941, MRN [id number]  PCP:  Lavone Orn, MD  Cardiologist: Fransico Him, MD EPS: None  Chief Complaint  Patient presents with  . Follow-up    Orthostatic hypotension, PAF, HTN, carotid stenosis, HLD    History of Present Illness:  Rhonda [name] is a 80 y.o. female with a hx of persistent AF s/p afib ablation 12 years ago,  bilateral carotid stenosis < 50%, PVC's, PAC's and nonsustained atrial tachycardia. She has been followed in afib clinic with Dr. Rayann Heman..   Patient did not want repeat ablation and is now on flecainide.  She has a CHADS2VASC score of 4 and is on Eliquis for anticoagulation.     She underwent home sleep study showing severe sleep apnea with an AHI of 38.1/h.  She underwent CPAP titration but could not be adequately titrated due to ongoing respiratory events.  A BiPAP titration was recommended but patient refused any further studies and stated that she was not going to use a Pap device.   She went back to see Dr. Ola Spurr who had done her original ablation and she had a stress test and Ziopatch.  The stress test was fine and he placed her on Flecainide.     She saw me 02/13/2020 was having problems with orthostatic hypotension and dizziness despite being placed on Florinef 0.1mg  daily which had helped her symptoms but she was still having symptoms so was given prescription for abdominal binder and thigh-high compression hose.   She was seen back by [name], PA and was doing better .  She was wearing compression stockings but couldn't get the abdominal binder to fit so she got a girdle type from [employer].   Her PCP had increased her Florinef and BP was trending upward and she was having palpitations.  She decreased her Florinef back to 0.1mg  daily but BP elevated persisted  I stopped her Florinef and increased Mestinon to 30mg  BID.  She is still having problems with dizziness when  standing up and BP readings sometimes could not be calibrated when she checked it. She got an abdominal binder but unfortunately it does not fit well.  She denies and chest pain or pressure, SOB, DOE, LE edema or syncope. She is compliant with her meds and is tolerating meds with no SE.    Past Medical History:  Diagnosis Date  . Abscess 2021   abdominal cyst, ruptured, treated with Doxycycline   . Adenomatous colon polyp    cecum, last colonoscopy 2006  . Atrial tachycardia ([place])    non sustatined with PVCs and PACs  . B12 deficiency 03/19/2020  . Bacterial meningitis    as a child  . Basilar migraine   . Carotid stenosis, bilateral 10/13/2016   1-39% bilateral by dopplers 10/2016  . Cystocele    MIld  . History of hemorrhoids   . Hx of chest pain    Atypical  . Hx of hematuria    IVP 1990 left kidney stone, cystoscopy negative  . Hyperlipidemia   . Hyperlipidemia LDL goal <70 10/26/2017  . Hypertension   . Joint pain   . Muscle weakness (generalized) 03/19/2020  . Nephrolithiasis   . OSA (obstructive sleep apnea) 10/26/2017   Severe with AHI 38/hr.  Patient refuses PAP therapy  . Osteoporosis    actonel since 2006  . PAF (paroxysmal atrial fibrillation) ([place])    radiofrequency ablation of  afib [facility]  . Right facial numbness 5/12   question TIA versus migraine,cardiac monitoring neg for AFIB  . Seizures ([place]) 1980   7 years on Dilantin  . Snoring   . SOB (shortness of breath)    Chronic mild, negative cardiolite  . Tinea versicolor     Past Surgical History:  Procedure Laterality Date  . ABLATION     Heart  . CHOLECYSTECTOMY    . multiple ankle surgeries      Current Medications: Current Meds  Medication Sig  . acetaminophen (TYLENOL) 325 MG tablet Take 650 mg by mouth every 6 (six) hours as needed for mild pain.  Marland Kitchen alendronate (FOSAMAX) 70 MG tablet Take 70 mg by mouth once a week. Take with a full glass of water on an empty stomach.  Marland Kitchen apixaban (ELIQUIS) 5  MG TABS tablet Take 1 tablet (5 mg total) by mouth 2 (two) times daily.  . Ascorbic Acid (VITAMIN C PO) Take 1 tablet by mouth daily.  . Cholecalciferol (VITAMIN D3 PO) Take 625 mg by mouth daily.  Marland Kitchen diltiazem (CARDIZEM) 30 MG tablet Take 30 mg by mouth every 4 (four) hours as needed (afib HR >100).  . flecainide (TAMBOCOR) 50 MG tablet TAKE 1 TABLET BY MOUTH  TWICE DAILY  . Loratadine (CLARITIN PO) Take 1 tablet by mouth daily as needed (allergies).   Marland Kitchen omeprazole (PRILOSEC OTC) 20 MG tablet Take 20 mg by mouth as needed.  . pyridostigmine (MESTINON) 60 MG tablet Take 0.5 tablets (30 mg total) by mouth 2 (two) times daily.  . rosuvastatin (CRESTOR) 5 MG tablet Take 5 mg (1 tablet) twice weekly  . Sennosides (SENOKOT PO) Take 1 tablet by mouth daily as needed.   . vitamin B-12 (CYANOCOBALAMIN) 1000 MCG tablet Take 1,000 mcg by mouth daily.   Current Facility-Administered Medications for the 07/28/20 encounter (Office Visit) with Sueanne Margarita, MD  Medication  . cyanocobalamin ((VITAMIN B-12)) injection 1,000 mcg     Allergies:   Erythromycin and Pravastatin sodium   Social History   Socioeconomic History  . Marital status: Married    Spouse name: Not on file  . Number of children: Not on file  . Years of education: Not on file  . Highest education level: Not on file  Occupational History  . Occupation: Retired  Tobacco Use  . Smoking status: Never Smoker  . Smokeless tobacco: Never Used  Vaping Use  . Vaping Use: Never used  Substance and Sexual Activity  . Alcohol use: Not Currently    Comment: 1-2 glasses of wine weekly  . Drug use: No  . Sexual activity: Not on file  Other Topics Concern  . Not on file  Social History Narrative   Lives with spouse   Right handed   Caffeine: 1.5 cups/day   Social Determinants of Health   Financial Resource Strain: Not on file  Food Insecurity: Not on file  Transportation Needs: Not on file  Physical Activity: Not on file   Stress: Not on file  Social Connections: Not on file     Family History:  The patient's   family history includes Heart attack in her father; Parkinson's disease in her mother; Sudden death in her father.   ROS:   Please see the history of present illness.    ROS All other systems reviewed and are negative.   PHYSICAL EXAM:   VS:  BP 109/68   Pulse 66   Ht 5\' 5"  (1.651  m)   Wt 171 lb (77.6 kg)   BMI 28.46 kg/m    Orthostatic VS for the past 24 hrs (Last 3 readings):  BP- Lying Pulse- Lying BP- Sitting Pulse- Sitting BP- Standing at 0 minutes Pulse- Standing at 0 minutes BP- Standing at 3 minutes Pulse- Standing at 3 minutes  07/28/20 1311 157/83 58 135/80 63 90/62 66 95/68 69    Physical Exam   GEN: Well nourished, well developed in no acute distress HEENT: Normal NECK: No JVD; No carotid bruits LYMPHATICS: No lymphadenopathy CARDIAC:RRR, no murmurs, rubs, gallops RESPIRATORY:  Clear to auscultation without rales, wheezing or rhonchi  ABDOMEN: Soft, non-tender, non-distended MUSCULOSKELETAL:  No edema; No deformity  SKIN: Warm and dry NEUROLOGIC:  Alert and oriented x 3 PSYCHIATRIC:  Normal affect     Wt Readings from Last 3 Encounters:  07/28/20 171 lb (77.6 kg)  06/30/20 169 lb (76.7 kg)  06/24/20 170 lb 8 oz (77.3 kg)      Studies/Labs Reviewed:   EKG:  EKG is not ordered today.    Recent Labs: 02/17/2020: Hemoglobin 12.3; Platelets 229 03/24/2020: TSH 2.310 06/30/2020: ALT 11; BUN 16; Creatinine, Ser 0.83; Potassium 4.7; Sodium 141   Lipid Panel    Component Value Date/Time   CHOL 139 06/30/2020 1002   TRIG 92 06/30/2020 1002   HDL 60 06/30/2020 1002   CHOLHDL 2.3 06/30/2020 1002   LDLCALC 62 06/30/2020 1002    Additional studies/ records that were reviewed today include:   NST 04/2017 Study Highlights    Nuclear stress EF: 68%.  There was no ST segment deviation noted during stress.  There is a small defect of mild severity present in the  basal inferolateral and mid inferolateral location. The defect is reversible. This likely is due to variations in diaphragmatic attenuation artifact but cannot rule out a very small area of ischemia  This is a low risk study.  The left ventricular ejection fraction is hyperdynamic (>65%).    2D echo 06/06/2019 Study Conclusions   - Left ventricle: The cavity size was normal. Wall thickness was   normal. Systolic function was normal. The estimated ejection   fraction was in the range of 55% to 60%. Wall motion was normal;   there were no regional wall motion abnormalities. Doppler   parameters are consistent with abnormal left ventricular   relaxation (grade 1 diastolic dysfunction). The E/e&' ratio is   between 8-15, suggesting indeterminate LV filling pressure. - Aortic valve: Trileaflet. Sclerosis without stenosis. There was   trivial regurgitation. - Mitral valve: Mildly thickened leaflets . There was trivial   regurgitation. - Left atrium: The atrium was normal in size. - Tricuspid valve: There was mild regurgitation. - Pulmonary arteries: PA peak pressure: 30 mm Hg (S). - Inferior vena cava: The vessel was normal in size. The   respirophasic diameter changes were in the normal range (= 50%),   consistent with normal central venous pressure.   Impressions:   - LVEF 55-60%, normal wall thickness, normal wall motion, diastolic   dysfunction, indeterminate LV filling pressure, aortic valve   sclerosis with trivial AI, normal LA size, mild TR, RVSP 30 mmHg,   normal IVC.  ASSESSMENT:    1. Orthostatic hypotension   2. Persistent atrial fibrillation ([place])   3. Benign essential HTN   4. Carotid stenosis, bilateral   5. SOB (shortness of breath)   6. Hyperlipidemia LDL goal <70      PLAN:  In order of  problems listed above:  Orthostatic hypotension  -She still has some dizziness but no syncope -her dizziness mainly occurs if she has been standing too long -she is  wearing her compression hose and got an abdominal binder but it does not fit well due to her stomach.  She has a girdle that she thinks will be tighter on her and I am fine with her wearing the girdle -orthostatics today showed a drop of almost 69mmhg in BP when standing with some dizziness -would avoid ProAmatine and Florinef because of systolic blood pressures in the 150s. -will increase Mestinon to 60mg  BID and followup with me in 4 weeks -continue tight fitting girdle and thigh high compression hose -I will check an am Cortisol level to rule out adrenal insuff -thyroid panel normal  Persistent atrial fibrillation  -status post ablation 12 years ago with recurrent PAF  -she occasionally has some skipped heart beats -she denies any bleeding problems on Eliquis -continue Eliquis 5mg  BID -SCr normal at 0.83 in [place]  2021 and Hbg 12.3 in Aug 2021  Essential hypertension  -BP elevated today but I do not want to add anything for worry of dropping BP when she stands  Carotid artery stenosis  -repeat dopplers 01/2019 stable with 1 to 39% left carotid stenosis -repeat dopplers 01/2021  Dyspnea on exertion  -felt secondary to deconditioning and sedentary lifestyle.   -Echo 05/2019 normal LVEF with grade 1 DD  Hyperlipidemia  -LDL goal < 70 -LDL 62 in Dec 2021 -continue Crestor to 5mg  2  times weekly  Followup with me in 4 weeks  Medication Adjustments/Labs and Tests Ordered: Current medicines are reviewed at length with the patient today.  Concerns regarding medicines are outlined above.  Medication changes, Labs and Tests ordered today are listed in the Patient Instructions below. There are no Patient Instructions on file for this visit.   Signed, Fransico Him, MD  07/28/2020 1:40 PM    [facility] [address], [place], [place]  [address] Phone: [phone]; Fax: [phone]

## 2020-07-28 NOTE — Addendum Note (Signed)
Addended by: Antonieta Iba on: 07/28/2020 01:46 PM   Modules accepted: Orders

## 2020-07-30 ENCOUNTER — Other Ambulatory Visit: Payer: Medicare Other

## 2020-08-05 ENCOUNTER — Other Ambulatory Visit: Payer: Medicare Other

## 2020-08-06 ENCOUNTER — Other Ambulatory Visit: Payer: Medicare Other | Admitting: *Deleted

## 2020-08-06 ENCOUNTER — Other Ambulatory Visit: Payer: Self-pay

## 2020-08-06 DIAGNOSIS — I4819 Other persistent atrial fibrillation: Secondary | ICD-10-CM

## 2020-08-06 DIAGNOSIS — I10 Essential (primary) hypertension: Secondary | ICD-10-CM | POA: Diagnosis not present

## 2020-08-06 DIAGNOSIS — R0602 Shortness of breath: Secondary | ICD-10-CM | POA: Diagnosis not present

## 2020-08-06 DIAGNOSIS — I6523 Occlusion and stenosis of bilateral carotid arteries: Secondary | ICD-10-CM | POA: Diagnosis not present

## 2020-08-06 DIAGNOSIS — E785 Hyperlipidemia, unspecified: Secondary | ICD-10-CM

## 2020-08-06 DIAGNOSIS — I951 Orthostatic hypotension: Secondary | ICD-10-CM | POA: Diagnosis not present

## 2020-08-07 LAB — CORTISOL-AM, BLOOD: Cortisol - AM: 16.5 ug/dL (ref 6.2–19.4)

## 2020-09-03 ENCOUNTER — Other Ambulatory Visit: Payer: Self-pay

## 2020-09-03 ENCOUNTER — Encounter: Payer: Self-pay | Admitting: Cardiology

## 2020-09-03 ENCOUNTER — Ambulatory Visit: Payer: Medicare Other | Admitting: Cardiology

## 2020-09-03 VITALS — BP 154/80 | HR 58 | Ht 65.0 in | Wt 172.0 lb

## 2020-09-03 DIAGNOSIS — I4819 Other persistent atrial fibrillation: Secondary | ICD-10-CM | POA: Diagnosis not present

## 2020-09-03 DIAGNOSIS — I10 Essential (primary) hypertension: Secondary | ICD-10-CM

## 2020-09-03 DIAGNOSIS — I951 Orthostatic hypotension: Secondary | ICD-10-CM | POA: Diagnosis not present

## 2020-09-03 DIAGNOSIS — I6523 Occlusion and stenosis of bilateral carotid arteries: Secondary | ICD-10-CM | POA: Diagnosis not present

## 2020-09-03 NOTE — Progress Notes (Signed)
Cardiology Office Note    Date:  09/03/2020   ID:  RIMA BLIZZARD, DOB 06-17-1941, MRN [id number]  PCP:  Rhonda Orn, MD  Cardiologist: Rhonda Him, MD EPS: None  Chief Complaint  Patient presents with  . Follow-up    Orthostatic hypotension, HTN, PAF    History of Present Illness:  Rhonda [name] is a 80 y.o. female with a hx of persistent AF s/p afib ablation 12 years ago,  bilateral carotid stenosis < 50%, PVC's, PAC's and nonsustained atrial tachycardia. She has been followed in afib clinic with Rhonda [name]..   Patient did not want repeat ablation and is now on flecainide.  She has a CHADS2VASC score of 4 and is on Eliquis for anticoagulation.     She underwent home sleep study showing severe sleep apnea with an AHI of 38.1/h.  She underwent CPAP titration but could not be adequately titrated due to ongoing respiratory events.  A BiPAP titration was recommended but patient refused any further studies and stated that she was not going to use a Pap device.   She went back to see Dr. Ola [name] who had done her original ablation and she had a stress test and Ziopatch.  The stress test was fine and he placed her on Flecainide.     She saw me 02/13/2020 was having problems with orthostatic hypotension and dizziness despite being placed on Florinef 0.1mg  daily which had helped her symptoms but she was still having symptoms so was given prescription for abdominal binder and thigh-high compression hose.   She was seen back by Rhonda Husk, PA and was doing better .  She was wearing compression stockings but couldn't get the abdominal binder to fit so she got a girdle type from [employer].   Her PCP had increased her Florinef and BP was trending upward and she was having palpitations.  She decreased her Florinef back to 0.1mg  daily but BP elevated persisted  I stopped her Florinef and increased Mestinon to 30mg  BID.  At last OV she was still having problems with dizziness when standing up and  BP readings sometimes could not be calibrated when she checked it. She got an abdominal binder but unfortunately it does not fit well.  Her Mestinon was increased to 60mg  BID.  She is back today and thinks that the increased dose of Mestinon has helped.  Unfortunately she has developed some cholinergic side effects such has excessive sweating and abdominal cramping after meals although it is mild.  Her dizziness has improved some and mainly only gets dizzy after standing for a long period of time.  She has not had any syncope.    Past Medical History:  Diagnosis Date  . Abscess 2021   abdominal cyst, ruptured, treated with Doxycycline   . Adenomatous colon polyp    cecum, last colonoscopy 2006  . Atrial tachycardia ([place])    non sustatined with PVCs and PACs  . B12 deficiency 03/19/2020  . Bacterial meningitis    as a child  . Basilar migraine   . Carotid stenosis, bilateral 10/13/2016   1-39% bilateral by dopplers 10/2016  . Cystocele    MIld  . History of hemorrhoids   . Hx of chest pain    Atypical  . Hx of hematuria    IVP 1990 left kidney stone, cystoscopy negative  . Hyperlipidemia   . Hyperlipidemia LDL goal <70 10/26/2017  . Hypertension   . Joint pain   . Muscle weakness (generalized) 03/19/2020  .  Nephrolithiasis   . OSA (obstructive sleep apnea) 10/26/2017   Severe with AHI 38/hr.  Patient refuses PAP therapy  . Osteoporosis    actonel since 2006  . PAF (paroxysmal atrial fibrillation) ([place])    radiofrequency ablation of afib [facility]  . Right facial numbness 5/12   question TIA versus migraine,cardiac monitoring neg for AFIB  . Seizures ([place]) 1980   7 years on Dilantin  . Snoring   . SOB (shortness of breath)    Chronic mild, negative cardiolite  . Tinea versicolor     Past Surgical History:  Procedure Laterality Date  . ABLATION     Heart  . CHOLECYSTECTOMY    . multiple ankle surgeries      Current Medications: Current Meds  Medication Sig  .  acetaminophen (TYLENOL) 325 MG tablet Take 650 mg by mouth every 6 (six) hours as needed for mild pain.  Marland Kitchen alendronate (FOSAMAX) 70 MG tablet Take 70 mg by mouth once a week. Take with a full glass of water on an empty stomach.  Marland Kitchen apixaban (ELIQUIS) 5 MG TABS tablet Take 1 tablet (5 mg total) by mouth 2 (two) times daily.  . Ascorbic Acid (VITAMIN C PO) Take 1 tablet by mouth daily.  . Cholecalciferol (VITAMIN D3 PO) Take 625 mg by mouth daily.  Marland Kitchen diltiazem (CARDIZEM) 30 MG tablet Take 30 mg by mouth every 4 (four) hours as needed (afib HR >100).  . flecainide (TAMBOCOR) 50 MG tablet TAKE 1 TABLET BY MOUTH  TWICE DAILY  . Loratadine (CLARITIN PO) Take 1 tablet by mouth daily as needed (allergies).   Marland Kitchen omeprazole (PRILOSEC OTC) 20 MG tablet Take 20 mg by mouth as needed.  . pyridostigmine (MESTINON) 60 MG tablet Take 1 tablet (60 mg total) by mouth in the morning and at bedtime.  . rosuvastatin (CRESTOR) 5 MG tablet Take 5 mg (1 tablet) twice weekly  . Sennosides (SENOKOT PO) Take 1 tablet by mouth daily as needed.   . vitamin B-12 (CYANOCOBALAMIN) 1000 MCG tablet Take 1,000 mcg by mouth daily.   Current Facility-Administered Medications for the 09/03/20 encounter (Office Visit) with Rhonda Margarita, MD  Medication  . cyanocobalamin ((VITAMIN B-12)) injection 1,000 mcg     Allergies:   Erythromycin and Pravastatin sodium   Social History   Socioeconomic History  . Marital status: Married    Spouse name: Not on file  . Number of children: Not on file  . Years of education: Not on file  . Highest education level: Not on file  Occupational History  . Occupation: Retired  Tobacco Use  . Smoking status: Never Smoker  . Smokeless tobacco: Never Used  Vaping Use  . Vaping Use: Never used  Substance and Sexual Activity  . Alcohol use: Not Currently    Comment: 1-2 glasses of wine weekly  . Drug use: No  . Sexual activity: Not on file  Other Topics Concern  . Not on file  Social  History Narrative   Lives with spouse   Right handed   Caffeine: 1.5 cups/day   Social Determinants of Health   Financial Resource Strain: Not on file  Food Insecurity: Not on file  Transportation Needs: Not on file  Physical Activity: Not on file  Stress: Not on file  Social Connections: Not on file     Family History:  The patient's   family history includes Heart attack in her father; Parkinson's disease in her mother; Sudden death in her  father.   ROS:   Please see the history of present illness.    ROS All other systems reviewed and are negative.   PHYSICAL EXAM:   VS:  BP (!) 154/80   Pulse (!) 58   Ht 5\' 5"  (1.651 m)   Wt 172 lb (78 kg)   SpO2 97%   BMI 28.62 kg/m    Orthostatic VS for the past 24 hrs (Last 3 readings):  BP- Lying Pulse- Lying BP- Sitting Pulse- Sitting BP- Standing at 0 minutes Pulse- Standing at 0 minutes BP- Standing at 3 minutes Pulse- Standing at 3 minutes  09/03/20 1359 150/81 57 154/80 58 111/71 62 102/67 63    Orthostatic VS for the past 24 hrs (Last 3 readings):  BP- Lying Pulse- Lying BP- Sitting Pulse- Sitting BP- Standing at 0 minutes Pulse- Standing at 0 minutes BP- Standing at 3 minutes Pulse- Standing at 3 minutes  09/03/20 1359 150/81 57 154/80 58 111/71 62 102/67 63    Physical Exam   GEN: Well nourished, well developed in no acute distress HEENT: Normal NECK: No JVD; No carotid bruits LYMPHATICS: No lymphadenopathy CARDIAC:RRR, no murmurs, rubs, gallops RESPIRATORY:  Clear to auscultation without rales, wheezing or rhonchi  ABDOMEN: Soft, non-tender, non-distended MUSCULOSKELETAL:  No edema; No deformity  SKIN: Warm and dry NEUROLOGIC:  Alert and oriented x 3 PSYCHIATRIC:  Normal affect   Wt Readings from Last 3 Encounters:  09/03/20 172 lb (78 kg)  07/28/20 171 lb (77.6 kg)  06/30/20 169 lb (76.7 kg)    Studies/Labs Reviewed:   EKG:  EKG is not ordered today.    Recent Labs: 02/17/2020: Hemoglobin 12.3;  Platelets 229 03/24/2020: TSH 2.310 06/30/2020: ALT 11; BUN 16; Creatinine, Ser 0.83; Potassium 4.7; Sodium 141   Lipid Panel    Component Value Date/Time   CHOL 139 06/30/2020 1002   TRIG 92 06/30/2020 1002   HDL 60 06/30/2020 1002   CHOLHDL 2.3 06/30/2020 1002   LDLCALC 62 06/30/2020 1002    Additional studies/ records that were reviewed today include:   NST 04/2017 Study Highlights    Nuclear stress EF: 68%.  There was no ST segment deviation noted during stress.  There is a small defect of mild severity present in the basal inferolateral and mid inferolateral location. The defect is reversible. This likely is due to variations in diaphragmatic attenuation artifact but cannot rule out a very small area of ischemia  This is a low risk study.  The left ventricular ejection fraction is hyperdynamic (>65%).    2D echo 06/06/2019 Study Conclusions   - Left ventricle: The cavity size was normal. Wall thickness was   normal. Systolic function was normal. The estimated ejection   fraction was in the range of 55% to 60%. Wall motion was normal;   there were no regional wall motion abnormalities. Doppler   parameters are consistent with abnormal left ventricular   relaxation (grade 1 diastolic dysfunction). The E/e&' ratio is   between 8-15, suggesting indeterminate LV filling pressure. - Aortic valve: Trileaflet. Sclerosis without stenosis. There was   trivial regurgitation. - Mitral valve: Mildly thickened leaflets . There was trivial   regurgitation. - Left atrium: The atrium was normal in size. - Tricuspid valve: There was mild regurgitation. - Pulmonary arteries: PA peak pressure: 30 mm Hg (S). - Inferior vena cava: The vessel was normal in size. The   respirophasic diameter changes were in the normal range (= 50%),   consistent with  normal central venous pressure.   Impressions:   - LVEF 55-60%, normal wall thickness, normal wall motion, diastolic   dysfunction,  indeterminate LV filling pressure, aortic valve   sclerosis with trivial AI, normal LA size, mild TR, RVSP 30 mmHg,   normal IVC.  ASSESSMENT:    1. Orthostatic hypotension   2. Persistent atrial fibrillation ([place])   3. Benign essential HTN   4. Carotid stenosis, bilateral      PLAN:  In order of problems listed above:  Orthostatic hypotension  -her dizziness mainly occurs if she has been standing too long and has improved with Mestinon but unfortunately has developed some cholinergic side effects -orthostatics are much improved today -need avoid ProAmatine and Florinef because of systolic blood pressures in the 150s. -continue Mestinon to 60mg  BID  -continue tight fitting girdle and thigh high compression hose>>she tried an abdominal binder but had a hard time with it fitting -am Cortisol and thyroid panel were normal -I am going to refer to Dr. Caryl Comes to see if he has any further suggestions  Persistent atrial fibrillation  -status post ablation 12 years ago with recurrent PAF  -she is maintaining NSR and has not had a reoccurence of PAF -she denies any bleeding problems on Eliquis -continue Eliquis 5mg  BID -SCr normal at 0.83 in Dec  2021 and Hbg 12.3 in Aug 2021  Essential hypertension  -BP remains borderline elevated today but I do not want to add anything for worry of dropping BP when she stands  Carotid artery stenosis  -repeat dopplers 01/2019 stable with 1 to 39% left carotid stenosis -repeat dopplers 01/2021   Medication Adjustments/Labs and Tests Ordered: Current medicines are reviewed at length with the patient today.  Concerns regarding medicines are outlined above.  Medication changes, Labs and Tests ordered today are listed in the Patient Instructions below. There are no Patient Instructions on file for this visit.   Signed, Rhonda Him, MD  09/03/2020 2:24 PM    [facility] [address], [place], [place]  [address] Phone: [phone]; Fax: [phone]

## 2020-09-03 NOTE — Addendum Note (Signed)
Addended by: Antonieta Iba on: 09/03/2020 02:41 PM   Modules accepted: Orders

## 2020-09-03 NOTE — Patient Instructions (Signed)
Medication Instructions:  Your physician recommends that you continue on your current medications as directed. Please refer to the Current Medication list given to you today.  *If you need a refill on your cardiac medications before your next appointment, please call your pharmacy*  Follow-Up: At [facility], you and your health needs are our priority.  As part of our continuing mission to provide you with exceptional heart care, we have created designated Provider Care Teams.  These Care Teams include your primary Cardiologist (physician) and Advanced Practice Providers (APPs -  Physician Assistants and Nurse Practitioners) who all work together to provide you with the care you need, when you need it.  Your next appointment:   3 month(s)  The format for your next appointment:   In Person  Provider:   You may see Fransico Him, MD or one of the following Advanced Practice Providers on your designated Care Team:    Melina Copa, PA-C  [name], PA-C  Other Instructions You have been referred to see Dr. Virl Axe.

## 2020-09-07 DIAGNOSIS — G4733 Obstructive sleep apnea (adult) (pediatric): Secondary | ICD-10-CM | POA: Diagnosis not present

## 2020-09-07 DIAGNOSIS — Z1389 Encounter for screening for other disorder: Secondary | ICD-10-CM | POA: Diagnosis not present

## 2020-09-07 DIAGNOSIS — D126 Benign neoplasm of colon, unspecified: Secondary | ICD-10-CM | POA: Diagnosis not present

## 2020-09-07 DIAGNOSIS — I480 Paroxysmal atrial fibrillation: Secondary | ICD-10-CM | POA: Diagnosis not present

## 2020-09-07 DIAGNOSIS — M810 Age-related osteoporosis without current pathological fracture: Secondary | ICD-10-CM | POA: Diagnosis not present

## 2020-09-07 DIAGNOSIS — I951 Orthostatic hypotension: Secondary | ICD-10-CM | POA: Diagnosis not present

## 2020-09-07 DIAGNOSIS — E7800 Pure hypercholesterolemia, unspecified: Secondary | ICD-10-CM | POA: Diagnosis not present

## 2020-09-07 DIAGNOSIS — D6869 Other thrombophilia: Secondary | ICD-10-CM | POA: Diagnosis not present

## 2020-09-07 DIAGNOSIS — Z0000 Encounter for general adult medical examination without abnormal findings: Secondary | ICD-10-CM | POA: Diagnosis not present

## 2020-09-07 DIAGNOSIS — I119 Hypertensive heart disease without heart failure: Secondary | ICD-10-CM | POA: Diagnosis not present

## 2020-10-01 ENCOUNTER — Encounter: Payer: Self-pay | Admitting: Neurology

## 2020-10-01 ENCOUNTER — Other Ambulatory Visit: Payer: Self-pay

## 2020-10-01 ENCOUNTER — Ambulatory Visit: Payer: Medicare Other | Admitting: Neurology

## 2020-10-01 VITALS — BP 133/76 | HR 74 | Ht 64.0 in | Wt 172.6 lb

## 2020-10-01 DIAGNOSIS — M810 Age-related osteoporosis without current pathological fracture: Secondary | ICD-10-CM | POA: Diagnosis not present

## 2020-10-01 DIAGNOSIS — I480 Paroxysmal atrial fibrillation: Secondary | ICD-10-CM | POA: Diagnosis not present

## 2020-10-01 DIAGNOSIS — I951 Orthostatic hypotension: Secondary | ICD-10-CM | POA: Diagnosis not present

## 2020-10-01 DIAGNOSIS — E7800 Pure hypercholesterolemia, unspecified: Secondary | ICD-10-CM | POA: Diagnosis not present

## 2020-10-01 DIAGNOSIS — G4700 Insomnia, unspecified: Secondary | ICD-10-CM | POA: Diagnosis not present

## 2020-10-01 DIAGNOSIS — M6281 Muscle weakness (generalized): Secondary | ICD-10-CM | POA: Diagnosis not present

## 2020-10-01 DIAGNOSIS — I119 Hypertensive heart disease without heart failure: Secondary | ICD-10-CM | POA: Diagnosis not present

## 2020-10-01 NOTE — Progress Notes (Signed)
Reason for visit: Muscle weakness, orthostatic hypotension  Rhonda [name] is an 80 y.o. female  History of present illness:  Rhonda [name] is a 80 year old right-handed white female with a history of atrial fibrillation and B12 deficiency and a mild sensory neuropathy.  The patient has orthostatic hypotension, she has been treated with Florinef in the past but this resulted in some increase in her blood pressure.  She has been on Mestinon, but she is having some side effects with growling of the stomach and runny nose and sweats.  She will be seeing Dr. Caryl Comes from cardiology in the near future.  The patient was advised to do exercises when last seen, she indicates that she remains quite inactive, she has not been doing much walking or any other regimented exercise program.  She indicates that she feels better when she is walking but if she stands still for 2 or 3 minutes, she begins feeling woozy.  She has not fallen.  She returns to this office for an evaluation.  Past Medical History:  Diagnosis Date  . Abscess 2021   abdominal cyst, ruptured, treated with Doxycycline   . Adenomatous colon polyp    cecum, last colonoscopy 2006  . Atrial tachycardia ([place])    non sustatined with PVCs and PACs  . B12 deficiency 03/19/2020  . Bacterial meningitis    as a child  . Basilar migraine   . Carotid stenosis, bilateral 10/13/2016   1-39% bilateral by dopplers 10/2016  . Cystocele    MIld  . History of hemorrhoids   . Hx of chest pain    Atypical  . Hx of hematuria    IVP 1990 left kidney stone, cystoscopy negative  . Hyperlipidemia   . Hyperlipidemia LDL goal <70 10/26/2017  . Hypertension   . Joint pain   . Muscle weakness (generalized) 03/19/2020  . Nephrolithiasis   . OSA (obstructive sleep apnea) 10/26/2017   Severe with AHI 38/hr.  Patient refuses PAP therapy  . Osteoporosis    actonel since 2006  . PAF (paroxysmal atrial fibrillation) ([place])    radiofrequency ablation of afib  bowman gray  . Right facial numbness 5/12   question TIA versus migraine,cardiac monitoring neg for AFIB  . Seizures ([place]) 1980   7 years on Dilantin  . Snoring   . SOB (shortness of breath)    Chronic mild, negative cardiolite  . Tinea versicolor     Past Surgical History:  Procedure Laterality Date  . ABLATION     Heart  . CHOLECYSTECTOMY    . multiple ankle surgeries      Family History  Problem Relation Age of Onset  . Parkinson's disease Mother   . Heart attack Father   . Sudden death Father     Social history:  reports that she has never smoked. She has never used smokeless tobacco. She reports previous alcohol use. She reports that she does not use drugs.    Allergies  Allergen Reactions  . Erythromycin Other (See Comments)    Yeast infection  . Pravastatin Sodium Other (See Comments)    Medications:  Prior to Admission medications   Medication Sig Start Date End Date Taking? Authorizing Provider  acetaminophen (TYLENOL) 325 MG tablet Take 650 mg by mouth every 6 (six) hours as needed for mild pain.   Yes [provider]  alendronate (FOSAMAX) 70 MG tablet Take 70 mg by mouth once a week. Take with a full glass of water on  an empty stomach.   Yes [provider]  apixaban (ELIQUIS) 5 MG TABS tablet Take 1 tablet (5 mg total) by mouth 2 (two) times daily. 05/20/20  Yes Turner, Eber Hong, MD  Ascorbic Acid (VITAMIN C PO) Take 1 tablet by mouth daily.   Yes [provider]  Cholecalciferol (VITAMIN D3 PO) Take 625 mg by mouth daily.   Yes [provider]  diltiazem (CARDIZEM) 30 MG tablet Take 30 mg by mouth every 4 (four) hours as needed (afib HR >100).   Yes [provider]  flecainide (TAMBOCOR) 50 MG tablet TAKE 1 TABLET BY MOUTH  TWICE DAILY 07/22/20  Yes Turner, Traci R, MD  Loratadine (CLARITIN PO) Take 1 tablet by mouth daily as needed (allergies).    Yes [provider]  omeprazole (PRILOSEC OTC) 20 MG tablet  Take 20 mg by mouth as needed.   Yes [provider]  pyridostigmine (MESTINON) 60 MG tablet Take 1 tablet (60 mg total) by mouth in the morning and at bedtime. 07/28/20  Yes Turner, Eber Hong, MD  rosuvastatin (CRESTOR) 5 MG tablet Take 5 mg (1 tablet) twice weekly 05/20/20  Yes Turner, Traci R, MD  Sennosides (SENOKOT PO) Take 1 tablet by mouth daily as needed.    Yes [provider]  vitamin B-12 (CYANOCOBALAMIN) 1000 MCG tablet Take 1,000 mcg by mouth daily.   Yes [provider]    ROS:  Out of a complete 14 system review of symptoms, the patient complains only of the following symptoms, and all other reviewed systems are negative.  Dizziness Muscle weakness  Blood pressure 133/76, pulse 74, height 5\' 4"  (1.626 m), weight 172 lb 9.6 oz (78.3 kg).   Blood pressure, right arm, sitting is 144/80.  Blood pressure, right arm, standing is 90/64.  Physical Exam  General: The patient is alert and cooperative at the time of the examination.  Skin: No significant peripheral edema is noted.   Neurologic Exam  Mental status: The patient is alert and oriented x 3 at the time of the examination. The patient has apparent normal recent and remote memory, with an apparently normal attention span and concentration ability.   Cranial nerves: Facial symmetry is present. Speech is normal, no aphasia or dysarthria is noted. Extraocular movements are full. Visual fields are full.  Motor: The patient has good strength in all 4 extremities, with exception of a slight weakness with hip flexion bilaterally.  Sensory examination: Soft touch sensation is symmetric on the face, arms, and legs.  Coordination: The patient has good finger-nose-finger and heel-to-shin bilaterally.  Gait and station: The patient has a normal gait. Tandem gait is slightly unsteady. Romberg is negative. No drift is seen.  The patient is unable to stand from a seated position with arms  crossed.  Reflexes: Deep tendon reflexes are symmetric.   Assessment/Plan:  1.  Orthostatic hypotension  2.  Proximal leg weakness  The patient will be set up for physical therapy for leg strengthening exercises.  She needs to make a concerted effort to enter into a regular muscle toning exercise program.  The patient will follow up in 5 months.  If she is able to exercise and is not making progress, I will consider MRI study of the low back.  Jill Alexanders MD 10/01/2020 10:13 AM  [facility] [address] [place], [place] [address]  Phone [phone] Fax [phone]

## 2020-10-05 ENCOUNTER — Other Ambulatory Visit: Payer: Self-pay

## 2020-10-05 ENCOUNTER — Ambulatory Visit: Payer: Medicare Other | Admitting: Internal Medicine

## 2020-10-05 ENCOUNTER — Encounter: Payer: Self-pay | Admitting: Internal Medicine

## 2020-10-05 VITALS — BP 124/80 | HR 69 | Ht 64.0 in | Wt 171.0 lb

## 2020-10-05 DIAGNOSIS — I951 Orthostatic hypotension: Secondary | ICD-10-CM

## 2020-10-05 DIAGNOSIS — I4819 Other persistent atrial fibrillation: Secondary | ICD-10-CM

## 2020-10-05 MED ORDER — PYRIDOSTIGMINE BROMIDE 60 MG PO TABS: 30.0000 mg | ORAL_TABLET | Freq: Two times a day (BID) | ORAL | 3 refills | Status: DC

## 2020-10-05 NOTE — Patient Instructions (Addendum)
Medication Instructions:  Your physician has recommended you make the following change in your medication:   ** Decrease your Mestinon from 60mg  to 30mg  - 1/2 tablet twice daily.   *If you need a refill on your cardiac medications before your next appointment, please call your pharmacy*   Lab Work: None ordered.  If you have labs (blood work) drawn today and your tests are completely normal, you will receive your results only by: Marland Kitchen MyChart Message (if you have MyChart) OR . A paper copy in the mail If you have any lab test that is abnormal or we need to change your treatment, we will call you to review the results.   Testing/Procedures: None ordered.    Follow-Up: At [facility], you and your health needs are our priority.  As part of our continuing mission to provide you with exceptional heart care, we have created designated Provider Care Teams.  These Care Teams include your primary Cardiologist (physician) and Advanced Practice Providers (APPs -  Physician Assistants and Nurse Practitioners) who all work together to provide you with the care you need, when you need it.  We recommend signing up for the patient portal called "MyChart".  Sign up information is provided on this After Visit Summary.  MyChart is used to connect with patients for Virtual Visits (Telemedicine).  Patients are able to view lab/test results, encounter notes, upcoming appointments, etc.  Non-urgent messages can be sent to your provider as well.   To learn more about what you can do with MyChart, go to [email or web address].    Your next appointment:   Virtual visit with Dr Caryl Comes Thursday, 11/26/2020 at 2pm   Other Instructions Abdominal binder, Thigh sleeves and fluids as discussed bt Dr Caryl Comes

## 2020-10-05 NOTE — Progress Notes (Signed)
ELECTROPHYSIOLOGY CONSULT NOTE  Patient ID: Rhonda [name], MRN: [id number], DOB/AGE: 12/19/1940 80 y.o. Admit date: (Not on file) Date of Consult: 10/05/2020  Primary Physician: Lavone Orn, MD Primary Cardiologist: Rhonda [name] is a 80 y.o. female who is being seen today for the evaluation of orthostatic hypotension  at the request of Rhonda [name].   Chief Complaint: Orthostatic hypotension    HPI Rhonda [name] is a 80 y.o. female referred for orthostatic hypotension  Progressive over the last few years.  Fear of falling.  Weakness with prolonged standing.  Significant problems with constipation dry eyes and dry mouth.  Date Change BP (L>>S) Change HR  10/19 39 mm 7 bpm  8/21 46 2  2/22 48 5  3/22 51 4     Seen by neuro for generalized weakness ( confirmed by neuro)  Eval includes  Neg ESR, neg lyme/ACE/ACh receptors neg/ Cortisol normal   Diagnosis of atrial fibrillation for which she was previously seen by Rhonda [name].  Remote PVI 2006 at [facility].  Some discussions regarding repeat ablation with Rhonda. Greggory [name].  Declined.  Flecainide was initiated 12/19 atrial fibrillation has been not so problematic.  DATE PR interval QRSduration Dose  9/19  158 86 0  7//21 176 88 50  3/22 158 90 50   The patient denies chest pain, shortness of breath, nocturnal dyspnea, orthopnea or peripheral edema.  There have been some palpitations and lightheadedness.  She has not fallen.  Frequently she has to sit down and put her head between her legs..    Severe sleep apnea treated  DATE TEST EF   10/18 MYOVIEW    % No ischemia  11/20 Echo   55-60 % No hypertrophy         Date Cr K Hgb  12/21 0.83 4.7 12.3          Previous medications used:  Midodrine- Florinef-yes  Droxydopa-n Mestinon-yes with side effects Beta blockers-  Ivabradine-n Serotonin drugs-n Anticholinergics-n  Thromboembolic risk factors ( age  -2, HTN-1, TIA/CVA-2, Gender-1) for a CHADSVASc Score of  >=6      Hx of AFib-persistent; Hx of Ablation followed by [name]  Severe OSA      Past Medical History:  Diagnosis Date  . Abscess 2021   abdominal cyst, ruptured, treated with Doxycycline   . Adenomatous colon polyp    cecum, last colonoscopy 2006  . Atrial tachycardia ([place])    non sustatined with PVCs and PACs  . B12 deficiency 03/19/2020  . Bacterial meningitis    as a child  . Basilar migraine   . Carotid stenosis, bilateral 10/13/2016   1-39% bilateral by dopplers 10/2016  . Cystocele    MIld  . History of hemorrhoids   . Hx of chest pain    Atypical  . Hx of hematuria    IVP 1990 left kidney stone, cystoscopy negative  . Hyperlipidemia   . Hyperlipidemia LDL goal <70 10/26/2017  . Hypertension   . Joint pain   . Muscle weakness (generalized) 03/19/2020  . Nephrolithiasis   . OSA (obstructive sleep apnea) 10/26/2017   Severe with AHI 38/hr.  Patient refuses PAP therapy  . Osteoporosis    actonel since 2006  . PAF (paroxysmal atrial fibrillation) ([place])    radiofrequency ablation of afib bowman gray  . Right facial numbness 5/12   question TIA versus migraine,cardiac monitoring neg for AFIB  . Seizures ([place]) 1980   7 years  on Dilantin  . Snoring   . SOB (shortness of breath)    Chronic mild, negative cardiolite  . Tinea versicolor       Surgical History:  Past Surgical History:  Procedure Laterality Date  . ABLATION     Heart  . CHOLECYSTECTOMY    . multiple ankle surgeries       Home Meds: Prior to Admission medications   Medication Sig Start Date End Date Taking? Authorizing Provider  acetaminophen (TYLENOL) 325 MG tablet Take 650 mg by mouth every 6 (six) hours as needed for mild pain.    [provider]  alendronate (FOSAMAX) 70 MG tablet Take 70 mg by mouth once a week. Take with a full glass of water on an empty stomach.    [provider]  apixaban (ELIQUIS) 5 MG TABS tablet Take 1 tablet (5 mg total) by mouth 2 (two)  times daily. 05/20/20   Sueanne Margarita, MD  Ascorbic Acid (VITAMIN C PO) Take 1 tablet by mouth daily.    [provider]  Cholecalciferol (VITAMIN D3 PO) Take 625 mg by mouth daily.    [provider]  diltiazem (CARDIZEM) 30 MG tablet Take 30 mg by mouth every 4 (four) hours as needed (afib HR >100).    [provider]  flecainide (TAMBOCOR) 50 MG tablet TAKE 1 TABLET BY MOUTH  TWICE DAILY 07/22/20   Sueanne Margarita, MD  Loratadine (CLARITIN PO) Take 1 tablet by mouth daily as needed (allergies).     [provider]  omeprazole (PRILOSEC OTC) 20 MG tablet Take 20 mg by mouth as needed.    [provider]  pyridostigmine (MESTINON) 60 MG tablet Take 1 tablet (60 mg total) by mouth in the morning and at bedtime. 07/28/20   Sueanne Margarita, MD  rosuvastatin (CRESTOR) 5 MG tablet Take 5 mg (1 tablet) twice weekly 05/20/20   Sueanne Margarita, MD  Sennosides (SENOKOT PO) Take 1 tablet by mouth daily as needed.     [provider]  vitamin B-12 (CYANOCOBALAMIN) 1000 MCG tablet Take 1,000 mcg by mouth daily.    [provider]      Allergies:  Allergies  Allergen Reactions  . Erythromycin Other (See Comments)    Yeast infection  . Pravastatin Sodium Other (See Comments)    Social History   Socioeconomic History  . Marital status: Married    Spouse name: Not on file  . Number of children: Not on file  . Years of education: Not on file  . Highest education level: Not on file  Occupational History  . Occupation: Retired  Tobacco Use  . Smoking status: Never Smoker  . Smokeless tobacco: Never Used  Vaping Use  . Vaping Use: Never used  Substance and Sexual Activity  . Alcohol use: Not Currently    Comment: 1-2 glasses of wine weekly  . Drug use: No  . Sexual activity: Not on file  Other Topics Concern  . Not on file  Social History Narrative   Lives with spouse   Right handed   Caffeine: 1.5 cups/day   Social  Determinants of Health   Financial Resource Strain: Not on file  Food Insecurity: Not on file  Transportation Needs: Not on file  Physical Activity: Not on file  Stress: Not on file  Social Connections: Not on file  Intimate Partner Violence: Not on file     Family History  Problem Relation Age of Onset  .  Parkinson's disease Mother   . Heart attack Father   . Sudden death Father      ROS:  Please see the history of present illness.     All other systems reviewed and negative.    Physical Exam: Blood pressure 124/80, pulse 69, height $RemoveBe'5\' 4"'bTkqdyBLk$  (1.626 m), weight 171 lb (77.6 kg), SpO2 96 %. General: Well developed, well nourished female in no acute distress. Head: Normocephalic, atraumatic, sclera non-icteric, no xanthomas, nares are without discharge. EENT: normal Lymph Nodes:  none Back: without scoliosis/kyphosis, no CVA tendersness Neck: Negative for carotid bruits. JVD not elevated. Lungs: Clear bilaterally to auscultation without wheezes, rales, or rhonchi. Breathing is unlabored. Heart: RRR with S1 S2. No murmur , rubs, or gallops appreciated. Abdomen: Soft, non-tender, non-distended with normoactive bowel sounds. No hepatomegaly. No rebound/guarding. No obvious abdominal masses. Msk:  Strength and tone appear normal for age. Extremities: No clubbing or cyanosis tr  edema.  Distal pedal pulses are 2+ and equal bilaterally. Skin: Warm and Dry Neuro: Alert and oriented X 3. CN III-XII intact Grossly normal sensory and motor function . Psych:  Responds to questions appropriately with a normal affect.      Labs: Cardiac Enzymes No results for input(s): CKTOTAL, CKMB, TROPONINI in the last 72 hours. CBC Lab Results  Component Value Date   WBC 5.2 02/17/2020   HGB 12.3 02/17/2020   HCT 36.9 02/17/2020   MCV 92 02/17/2020   PLT 229 02/17/2020   PROTIME: No results for input(s): LABPROT, INR in the last 72 hours. Chemistry No results for input(s): NA, K, CL, CO2, BUN,  CREATININE, CALCIUM, PROT, BILITOT, ALKPHOS, ALT, AST, GLUCOSE in the last 168 hours.  Invalid input(s): LABALBU Lipids Lab Results  Component Value Date   CHOL 139 06/30/2020   HDL 60 06/30/2020   LDLCALC 62 06/30/2020   TRIG 92 06/30/2020   BNP No results found for: PROBNP Thyroid Function Tests: No results for input(s): TSH, T4TOTAL, T3FREE, THYROIDAB in the last 72 hours.  Invalid input(s): FREET3    Miscellaneous No results found for: DDIMER  Radiology/Studies:  No results found.  EKG: Sinus at 69 Interval 16/09/41 Interpolated PVC right bundle superior axis Otherwise normal  RR interval with respiration 1.05/1.0  Assessment and Plan:  Autonomic insufficiency  Hypertension-systolic  Atrial fibrillation-persistent    The patient has evidence of autonomic insufficiency with following blood pressure and an inappropriately reserved and constrained heart rate.  In the context of constipation dry eyes I suspect that this represents, especially given Rhonda. Tobey Grim exclusion of Parkinson's, pure autonomic failure.  We do not have facilities to test her autonomic's, perhaps Rhonda. Jannifer Franklin can make that referral  We discussed the physiology extensively and reviewed options in terms of medications as well as compression, and the role of fluids.  She is currently wearing an abdominal binder, which she does not wear at home because it pinches.  We have reviewed SPANX options for both abdominal binding as well as thigh sleeves.  Her above-the-knee but not fully thigh compression stockings are probably less advantageous  She is having side effects with Mestinon.  We will decrease it from 60--30.  She is disinclined towards more medications but we did discuss the potential benefits of ProAmatine.  She might be a candidate for droxidopa.  It could be cost prohibitive.  We also discussed the challenges of her and her husbands getting older and their sons.  Gave her the name, Being Mortal  Virl Axe

## 2020-11-17 DIAGNOSIS — I119 Hypertensive heart disease without heart failure: Secondary | ICD-10-CM | POA: Diagnosis not present

## 2020-11-17 DIAGNOSIS — M810 Age-related osteoporosis without current pathological fracture: Secondary | ICD-10-CM | POA: Diagnosis not present

## 2020-11-17 DIAGNOSIS — G4700 Insomnia, unspecified: Secondary | ICD-10-CM | POA: Diagnosis not present

## 2020-11-17 DIAGNOSIS — I480 Paroxysmal atrial fibrillation: Secondary | ICD-10-CM | POA: Diagnosis not present

## 2020-11-17 DIAGNOSIS — E7800 Pure hypercholesterolemia, unspecified: Secondary | ICD-10-CM | POA: Diagnosis not present

## 2020-11-26 ENCOUNTER — Other Ambulatory Visit: Payer: Self-pay

## 2020-11-26 ENCOUNTER — Encounter: Payer: Self-pay | Admitting: Internal Medicine

## 2020-11-26 ENCOUNTER — Telehealth (INDEPENDENT_AMBULATORY_CARE_PROVIDER_SITE_OTHER): Payer: Medicare Other | Admitting: Internal Medicine

## 2020-11-26 VITALS — BP 134/93 | HR 68 | Temp 98.5°F | Ht 64.0 in | Wt 169.5 lb

## 2020-11-26 DIAGNOSIS — I951 Orthostatic hypotension: Secondary | ICD-10-CM | POA: Diagnosis not present

## 2020-11-26 DIAGNOSIS — I4819 Other persistent atrial fibrillation: Secondary | ICD-10-CM | POA: Diagnosis not present

## 2020-11-26 NOTE — Progress Notes (Signed)
I do not remember that he did not feel we should thanks again for your help today and for the programmer for [place] July talk about this guy a?  We will get ablation really talked about ablation I have ECGs to do this "this week so it is going by after dinner". Regular hot do you ever I feel like POTS POTS of centimeter color of I feel like it when I got to the parking lot he used to be that are across his car was still out there but now there is no debility when I go there any cars but I do not overlook arteries because mostly when I go when I [place] after I do almost always or her car is failure currently the other 2    Electrophysiology TeleHealth Note   Due to national recommendations of social distancing due to [place] 19, an audio/video telehealth visit is felt to be most appropriate for this patient at this time.  See MyChart message from today for the patient's consent to telehealth for [facility].   Date:  11/26/2020   ID:  Rhonda [name], DOB September 16, 1940, MRN [id number]  Location: patient's home  Provider location: [address] [place]  Evaluation Performed: Follow-up visit  PCP:  Lavone Orn, MD  Cardiologist:   TT Electrophysiologist:  SK   Chief Complaint:  Orthostatic intolerance yeah or so more of them really I will be well with [facility] or what  History of Present Illness:    Rhonda [name] is a 80 y.o. female who presents via audio/video conferencing for a telehealth visit today.  Since last being seen in our clinic for orthostatic hypotension thought perhaps to have pure autonomic failure and decreasing of her Mestinon secondary to side effects with the recommendation of compressive wear, the patient reports feeling better.  Staying hydrated--some bad days after excessive exercise;  Difficult to find compressive wear that works.  Challenges are in size, adequate fit, and then been unable to take it off which she needs to urinate.  Overall however  doing much better.  Still challenged by standing and lying or standing up from the garden   Date Orthostatic Change BP (L>>S) Orthostatic Change HR  10/19 39 mm 7 bpm  8/21 46 2  2/22 48 5  3/22 51 4     History of atrial fibrillation with remote PVI at [facility] with recurrent atrial fibrillation but declined to repeat ablation.  Currently treated with flecainide. Severe sleep apnea treated  DATE TEST EF   10/18 MYOVIEW    % No ischemia  11/20 Echo   55-60 % No hypertrophy         Date Cr K Hgb  12/21 0.83 4.7 12.3           The patient denies symptoms of fevers, chills, cough, or new SOB worrisome for COVID 19.    Past Medical History:  Diagnosis Date  . Abscess 2021   abdominal cyst, ruptured, treated with Doxycycline   . Adenomatous colon polyp    cecum, last colonoscopy 2006  . Atrial tachycardia ([place])    non sustatined with PVCs and PACs  . B12 deficiency 03/19/2020  . Bacterial meningitis    as a child  . Basilar migraine   . Carotid stenosis, bilateral 10/13/2016   1-39% bilateral by dopplers 10/2016  . Cystocele    MIld  . History of hemorrhoids   . Hx of chest pain    Atypical  .  Hx of hematuria    IVP 1990 left kidney stone, cystoscopy negative  . Hyperlipidemia   . Hyperlipidemia LDL goal <70 10/26/2017  . Hypertension   . Joint pain   . Muscle weakness (generalized) 03/19/2020  . Nephrolithiasis   . OSA (obstructive sleep apnea) 10/26/2017   Severe with AHI 38/hr.  Patient refuses PAP therapy  . Osteoporosis    actonel since 2006  . PAF (paroxysmal atrial fibrillation) ([place])    radiofrequency ablation of afib [facility]  . Right facial numbness 5/12   question TIA versus migraine,cardiac monitoring neg for AFIB  . Seizures ([place]) 1980   7 years on Dilantin  . Snoring   . SOB (shortness of breath)    Chronic mild, negative cardiolite  . Tinea versicolor     Past Surgical History:  Procedure Laterality Date  . ABLATION      Heart  . CHOLECYSTECTOMY    . multiple ankle surgeries      Current Outpatient Medications  Medication Sig Dispense Refill  . acetaminophen (TYLENOL) 325 MG tablet Take 650 mg by mouth every 6 (six) hours as needed for mild pain.    Marland Kitchen alendronate (FOSAMAX) 70 MG tablet Take 70 mg by mouth once a week. Take with a full glass of water on an empty stomach.    Marland Kitchen alum & mag hydroxide-simeth (MAALOX/MYLANTA) 200-200-20 MG/5ML suspension Take 30 mLs by mouth as needed for indigestion or heartburn.    Marland Kitchen apixaban (ELIQUIS) 5 MG TABS tablet Take 1 tablet (5 mg total) by mouth 2 (two) times daily. 360 tablet 1  . Ascorbic Acid (VITAMIN C PO) Take 1 tablet by mouth daily.    . Cholecalciferol (VITAMIN D3 PO) Take 625 mg by mouth daily.    Marland Kitchen diltiazem (CARDIZEM) 30 MG tablet Take 30 mg by mouth every 4 (four) hours as needed (afib HR >100).    . flecainide (TAMBOCOR) 50 MG tablet TAKE 1 TABLET BY MOUTH  TWICE DAILY 180 tablet 3  . Loratadine (CLARITIN PO) Take 1 tablet by mouth daily as needed (allergies).     . Multiple Vitamins-Minerals (ANTIOXIDANT) CAPS Take by mouth daily in the afternoon.    . pyridostigmine (MESTINON) 60 MG tablet Take 0.5 tablets (30 mg total) by mouth in the morning and at bedtime. 90 tablet 3  . rosuvastatin (CRESTOR) 5 MG tablet Take 5 mg (1 tablet) twice weekly 90 tablet 3  . Sennosides (SENOKOT PO) Take 1 tablet by mouth daily as needed.     . vitamin B-12 (CYANOCOBALAMIN) 1000 MCG tablet Take 1,000 mcg by mouth daily.     Current Facility-Administered Medications  Medication Dose Route Frequency Provider Last Rate Last Admin  . cyanocobalamin ((VITAMIN B-12)) injection 1,000 mcg  1,000 mcg Intramuscular Once Kathrynn Ducking, MD        Allergies:   Erythromycin and Pravastatin sodium   Social History:  The patient  reports that she has never smoked. She has never used smokeless tobacco. She reports previous alcohol use. She reports that she does not use drugs.    Family History:  The patient's   family history includes Heart attack in her father; Parkinson's disease in her mother; Sudden death in her father.   ROS:  Please see the history of present illness.   All other systems are personally reviewed and negative.    Exam:    Vital Signs:  BP (!) 134/93 Comment: standing  Pulse 68 Comment: standing  Temp 98.5  F (36.9 C)   Ht 5\' 4"  (1.626 m)   Wt 169 lb 8 oz (76.9 kg)   BMI 29.09 kg/m        Labs/Other Tests and Data Reviewed:    Recent Labs: 02/17/2020: Hemoglobin 12.3; Platelets 229 03/24/2020: TSH 2.310 06/30/2020: ALT 11; BUN 16; Creatinine, Ser 0.83; Potassium 4.7; Sodium 141   Wt Readings from Last 3 Encounters:  11/26/20 169 lb 8 oz (76.9 kg)  10/05/20 171 lb (77.6 kg)  10/01/20 172 lb 9.6 oz (78.3 kg)     Other studies personally reviewed: Additional studies/ records that were reviewed today include: Yes so they stopped his medications Exelon stop this altogether and folic needed no evidence   ASSESSMENT & PLAN:    Autonomic insufficiency possible pure autonomic failure   Hypertension-systolic  Atrial fibrillation-persistent   Holding sinus rhythm.  Continue flecainide.  Changes in blood pressure much less prominent.  She is feeling better on the lower doses of Mestinon but hydration has been key here.  She is struggled with compression as outlined above giving poor fit and getting off quickly enough to urinate.  She is continuing to work on this.  Given her small size, she may not tolerate more than a 4-6 inch abdominal belt.  Frustrated.  Lots going on with her husband now being referred to heart failure clinic  COVID 19 screen The patient denies symptoms of COVID 19 at this time.  The importance of social distancing was discussed today.  Follow-up: 3-4 months    Current medicines are reviewed at length with the patient today.   The patient does not have concerns regarding her medicines.  The following  changes were made today:  none  Labs/ tests ordered today include:   No orders of the defined types were placed in this encounter.    Patient Risk:  after full review of this patients clinical status, I feel that they are at moderate  risk at this time.  Today, I have spent 18 minutes with the patient with telehealth technology discussing the above.  Signed, Virl Axe, MD  11/26/2020 2:30 PM     [place] [address] [phone] (office) [phone] (fax)

## 2020-11-26 NOTE — Patient Instructions (Signed)
Medication Instructions:  Your physician recommends that you continue on your current medications as directed. Please refer to the Current Medication list given to you today.  *If you need a refill on your cardiac medications before your next appointment, please call your pharmacy*   Lab Work: None ordered.  If you have labs (blood work) drawn today and your tests are completely normal, you will receive your results only by: . MyChart Message (if you have MyChart) OR . A paper copy in the mail If you have any lab test that is abnormal or we need to change your treatment, we will call you to review the results.   Testing/Procedures: None ordered.    Follow-Up: At [facility], you and your health needs are our priority.  As part of our continuing mission to provide you with exceptional heart care, we have created designated Provider Care Teams.  These Care Teams include your primary Cardiologist (physician) and Advanced Practice Providers (APPs -  Physician Assistants and Nurse Practitioners) who all work together to provide you with the care you need, when you need it.  We recommend signing up for the patient portal called "MyChart".  Sign up information is provided on this After Visit Summary.  MyChart is used to connect with patients for Virtual Visits (Telemedicine).  Patients are able to view lab/test results, encounter notes, upcoming appointments, etc.  Non-urgent messages can be sent to your provider as well.   To learn more about what you can do with MyChart, go to [email or web address].    Your next appointment:   4 month(s)  The format for your next appointment:   In Person  Provider:   Steven Klein, MD     

## 2020-12-02 ENCOUNTER — Ambulatory Visit: Payer: Medicare Other | Admitting: Cardiology

## 2020-12-22 DIAGNOSIS — H40013 Open angle with borderline findings, low risk, bilateral: Secondary | ICD-10-CM | POA: Diagnosis not present

## 2020-12-29 DIAGNOSIS — H401130 Primary open-angle glaucoma, bilateral, stage unspecified: Secondary | ICD-10-CM | POA: Diagnosis not present

## 2021-01-25 DIAGNOSIS — H401130 Primary open-angle glaucoma, bilateral, stage unspecified: Secondary | ICD-10-CM | POA: Diagnosis not present

## 2021-01-26 ENCOUNTER — Other Ambulatory Visit: Payer: Self-pay

## 2021-01-26 ENCOUNTER — Encounter: Payer: Self-pay | Admitting: Cardiology

## 2021-01-26 ENCOUNTER — Ambulatory Visit: Payer: Medicare Other | Admitting: Cardiology

## 2021-01-26 VITALS — BP 132/76 | HR 63 | Ht 64.0 in | Wt 168.6 lb

## 2021-01-26 DIAGNOSIS — I6523 Occlusion and stenosis of bilateral carotid arteries: Secondary | ICD-10-CM | POA: Diagnosis not present

## 2021-01-26 DIAGNOSIS — G4733 Obstructive sleep apnea (adult) (pediatric): Secondary | ICD-10-CM | POA: Diagnosis not present

## 2021-01-26 DIAGNOSIS — E785 Hyperlipidemia, unspecified: Secondary | ICD-10-CM

## 2021-01-26 DIAGNOSIS — I951 Orthostatic hypotension: Secondary | ICD-10-CM | POA: Diagnosis not present

## 2021-01-26 DIAGNOSIS — I4819 Other persistent atrial fibrillation: Secondary | ICD-10-CM

## 2021-01-26 DIAGNOSIS — I10 Essential (primary) hypertension: Secondary | ICD-10-CM

## 2021-01-26 LAB — CBC
Hematocrit: 34.8 % (ref 34.0–46.6)
Hemoglobin: 11.4 g/dL (ref 11.1–15.9)
MCH: 30.8 pg (ref 26.6–33.0)
MCHC: 32.8 g/dL (ref 31.5–35.7)
MCV: 94 fL (ref 79–97)
Platelets: 210 10*3/uL (ref 150–450)
RBC: 3.7 x10E6/uL — ABNORMAL LOW (ref 3.77–5.28)
RDW: 12.7 % (ref 11.7–15.4)
WBC: 6 10*3/uL (ref 3.4–10.8)

## 2021-01-26 LAB — LIPID PANEL
Chol/HDL Ratio: 2.3 ratio (ref 0.0–4.4)
Cholesterol, Total: 131 mg/dL (ref 100–199)
HDL: 56 mg/dL (ref >39)
LDL Chol Calc (NIH): 55 mg/dL (ref 0–99)
Triglycerides: 108 mg/dL (ref 0–149)
VLDL Cholesterol Cal: 20 mg/dL (ref 5–40)

## 2021-01-26 LAB — ALT: ALT: 8 IU/L (ref 0–32)

## 2021-01-26 MED ORDER — APIXABAN 5 MG PO TABS: 5.0000 mg | ORAL_TABLET | Freq: Two times a day (BID) | ORAL | 1 refills | Status: DC

## 2021-01-26 MED ORDER — DILTIAZEM HCL 30 MG PO TABS: 30.0000 mg | ORAL_TABLET | ORAL | 1 refills | Status: AC | PRN

## 2021-01-26 MED ORDER — ROSUVASTATIN CALCIUM 5 MG PO TABS: ORAL_TABLET | ORAL | 3 refills | Status: DC

## 2021-01-26 MED ORDER — PYRIDOSTIGMINE BROMIDE 60 MG PO TABS: 30.0000 mg | ORAL_TABLET | Freq: Two times a day (BID) | ORAL | 3 refills | Status: DC

## 2021-01-26 MED ORDER — FLECAINIDE ACETATE 50 MG PO TABS: 50.0000 mg | ORAL_TABLET | Freq: Two times a day (BID) | ORAL | 1 refills | Status: DC

## 2021-01-26 NOTE — Patient Instructions (Signed)
Medication Instructions:  Your physician recommends that you continue on your current medications as directed. Please refer to the Current Medication list given to you today.  *If you need a refill on your cardiac medications before your next appointment, please call your pharmacy*   Lab Work: TODAY: CBC, FLP, ALT If you have labs (blood work) drawn today and your tests are completely normal, you will receive your results only by: [place] (if you have MyChart) OR A paper copy in the mail If you have any lab test that is abnormal or we need to change your treatment, we will call you to review the results.  Follow-Up: At [facility], you and your health needs are our priority.  As part of our continuing mission to provide you with exceptional heart care, we have created designated Provider Care Teams.  These Care Teams include your primary Cardiologist (physician) and Advanced Practice Providers (APPs -  Physician Assistants and Nurse Practitioners) who all work together to provide you with the care you need, when you need it.  Your next appointment:   6 month(s)  The format for your next appointment:   In Person  Provider:   You may see Fransico Him, MD or one of the following Advanced Practice Providers on your designated Care Team:   Melina Copa, PA-C [name], PA-C

## 2021-01-26 NOTE — Addendum Note (Signed)
Addended by: Antonieta Iba on: 01/26/2021 10:15 AM   Modules accepted: Orders

## 2021-01-26 NOTE — Progress Notes (Signed)
Cardiology Office Note    Date:  01/26/2021   ID:  Rhonda [name], DOB 1941/05/08, MRN [id number]  PCP:  Lavone Orn, MD  Cardiologist: Fransico Him, MD EPS: None  Chief Complaint  Patient presents with   Follow-up    PAF, orthostatic hypotension, carotid stenosis, HLD     History of Present Illness:  Rhonda [name] is a 80 y.o. female with a hx of persistent AF s/p afib ablation 12 years ago,  bilateral carotid stenosis < 50%, PVC's, PAC's and nonsustained atrial tachycardia. She has been followed in afib clinic with Dr. Rayann Heman..   Patient did not want repeat ablation and is now on flecainide.  She has a CHADS2VASC score of 4 and is on Eliquis for anticoagulation.     She underwent home sleep study showing severe sleep apnea with an AHI of 38.1/h.  She underwent CPAP titration but could not be adequately titrated due to ongoing respiratory events.  A BiPAP titration was recommended but patient refused any further studies and stated that she was not going to use a Pap device.   She went back to see Dr. Ola Spurr who had done her original ablation and she had a stress test and Ziopatch.  The stress test was fine and he placed her on Flecainide.     She saw me 02/13/2020 was having problems with orthostatic hypotension and dizziness despite being placed on Florinef 0.1mg  daily which had helped her symptoms but she was still having symptoms so was given prescription for abdominal binder and thigh-high compression hose.   She was seen back by [name], PA and was doing better .  She was wearing compression stockings but couldn't get the abdominal binder to fit so she got a girdle type from [employer].   Her PCP had increased her Florinef and BP was trending upward and she was having palpitations.  She decreased her Florinef back to 0.1mg  daily but BP elevated persisted  I stopped her Florinef and increased Mestinon to 30mg  BID.  At last OV she was still having problems with dizziness  when standing up and BP readings sometimes could not be calibrated when she checked it. She got an abdominal binder but unfortunately it does not fit well.  Her Mestinon was increased to 60mg  BID but due to SE, it was decreased to 30mg  BID when she was seen by Dr. Caryl Comes.    She is here today for followup and is doing well.  She denies any chest pain or pressure, SOB, DOE Except with extreme exertion), PND, orthopnea, LE edema, palpitations or syncope. She has had a few occasions when she got dizzy usually when out shopping and has not had any syncope.  She is compliant with her meds and is tolerating meds with no SE.     Past Medical History:  Diagnosis Date   Abscess 2021   abdominal cyst, ruptured, treated with Doxycycline    Adenomatous colon polyp    cecum, last colonoscopy 2006   Atrial tachycardia ([place])    non sustatined with PVCs and PACs   B12 deficiency 03/19/2020   Bacterial meningitis    as a child   Basilar migraine    Carotid stenosis, bilateral 10/13/2016   1-39% bilateral by dopplers 10/2016   Cystocele    MIld   History of hemorrhoids    Hx of chest pain    Atypical   Hx of hematuria    IVP 1990 left kidney stone, cystoscopy negative  Hyperlipidemia    Hyperlipidemia LDL goal <70 10/26/2017   Hypertension    Joint pain    Muscle weakness (generalized) 03/19/2020   Nephrolithiasis    OSA (obstructive sleep apnea) 10/26/2017   Severe with AHI 38/hr.  Patient refuses PAP therapy   Osteoporosis    actonel since 2006   PAF (paroxysmal atrial fibrillation) ([place])    radiofrequency ablation of afib [facility]   Right facial numbness 5/12   question TIA versus migraine,cardiac monitoring neg for AFIB   Seizures ([place]) 1980   7 years on Dilantin   Snoring    SOB (shortness of breath)    Chronic mild, negative cardiolite   Tinea versicolor     Past Surgical History:  Procedure Laterality Date   ABLATION     Heart   CHOLECYSTECTOMY     multiple ankle surgeries       Current Medications: Current Meds  Medication Sig   acetaminophen (TYLENOL) 325 MG tablet Take 650 mg by mouth every 6 (six) hours as needed for mild pain.   alendronate (FOSAMAX) 70 MG tablet Take 70 mg by mouth once a week. Take with a full glass of water on an empty stomach.   alum & mag hydroxide-simeth (MAALOX/MYLANTA) 200-200-20 MG/5ML suspension Take 30 mLs by mouth as needed for indigestion or heartburn.   apixaban (ELIQUIS) 5 MG TABS tablet Take 1 tablet (5 mg total) by mouth 2 (two) times daily.   Ascorbic Acid (VITAMIN C PO) Take 1 tablet by mouth daily.   Bimatoprost (LUMIGAN OP) Apply 1 drop to eye daily. Each eye   Cholecalciferol (VITAMIN D3 PO) Take 625 mg by mouth daily.   diltiazem (CARDIZEM) 30 MG tablet Take 30 mg by mouth every 4 (four) hours as needed (afib HR >100).   flecainide (TAMBOCOR) 50 MG tablet TAKE 1 TABLET BY MOUTH  TWICE DAILY   Loratadine (CLARITIN PO) Take 1 tablet by mouth daily as needed (allergies).    Multiple Vitamins-Minerals (ANTIOXIDANT) CAPS Take by mouth daily in the afternoon.   pyridostigmine (MESTINON) 60 MG tablet Take 0.5 tablets (30 mg total) by mouth in the morning and at bedtime.   rosuvastatin (CRESTOR) 5 MG tablet Take 5 mg (1 tablet) twice weekly   Sennosides (SENOKOT PO) Take 1 tablet by mouth daily as needed.    vitamin B-12 (CYANOCOBALAMIN) 1000 MCG tablet Take 1,000 mcg by mouth daily.   Current Facility-Administered Medications for the 01/26/21 encounter (Office Visit) with Sueanne Margarita, MD  Medication   cyanocobalamin ((VITAMIN B-12)) injection 1,000 mcg     Allergies:   Erythromycin and Pravastatin sodium   Social History   Socioeconomic History   Marital status: Married    Spouse name: Not on file   Number of children: Not on file   Years of education: Not on file   Highest education level: Not on file  Occupational History   Occupation: Retired  Tobacco Use   Smoking status: Never   Smokeless tobacco:  Never  Vaping Use   Vaping Use: Never used  Substance and Sexual Activity   Alcohol use: Not Currently    Comment: 1-2 glasses of wine weekly   Drug use: No   Sexual activity: Not on file  Other Topics Concern   Not on file  Social History Narrative   Lives with spouse   Right handed   Caffeine: 1.5 cups/day   Social Determinants of Health   Financial Resource Strain: Not on file  Food Insecurity: Not on file  Transportation Needs: Not on file  Physical Activity: Not on file  Stress: Not on file  Social Connections: Not on file     Family History:  The patient's   family history includes Heart attack in her father; Parkinson's disease in her mother; Sudden death in her father.   ROS:   Please see the history of present illness.    ROS All other systems reviewed and are negative.   PHYSICAL EXAM:   VS:  BP 132/76   Pulse 63   Ht 5\' 4"  (1.626 m)   Wt 168 lb 9.6 oz (76.5 kg)   SpO2 97%   BMI 28.94 kg/m    GEN: Well nourished, well developed in no acute distress HEENT: Normal NECK: No JVD; No carotid bruits LYMPHATICS: No lymphadenopathy CARDIAC:RRR, no murmurs, rubs, gallops RESPIRATORY:  Clear to auscultation without rales, wheezing or rhonchi  ABDOMEN: Soft, non-tender, non-distended MUSCULOSKELETAL:  No edema; No deformity  SKIN: Warm and dry NEUROLOGIC:  Alert and oriented x 3 PSYCHIATRIC:  Normal affect   Wt Readings from Last 3 Encounters:  01/26/21 168 lb 9.6 oz (76.5 kg)  11/26/20 169 lb 8 oz (76.9 kg)  10/05/20 171 lb (77.6 kg)    Studies/Labs Reviewed:   EKG:  EKG is not ordered today.    Recent Labs: 02/17/2020: Hemoglobin 12.3; Platelets 229 03/24/2020: TSH 2.310 06/30/2020: ALT 11; BUN 16; Creatinine, Ser 0.83; Potassium 4.7; Sodium 141   Lipid Panel    Component Value Date/Time   CHOL 139 06/30/2020 1002   TRIG 92 06/30/2020 1002   HDL 60 06/30/2020 1002   CHOLHDL 2.3 06/30/2020 1002   LDLCALC 62 06/30/2020 1002    Additional studies/  records that were reviewed today include:   NST 04/2017 Study Highlights   Nuclear stress EF: 68%. There was no ST segment deviation noted during stress. There is a small defect of mild severity present in the basal inferolateral and mid inferolateral location. The defect is reversible. This likely is due to variations in diaphragmatic attenuation artifact but cannot rule out a very small area of ischemia This is a low risk study. The left ventricular ejection fraction is hyperdynamic (>65%).    2D echo 06/06/2019 Study Conclusions   - Left ventricle: The cavity size was normal. Wall thickness was   normal. Systolic function was normal. The estimated ejection   fraction was in the range of 55% to 60%. Wall motion was normal;   there were no regional wall motion abnormalities. Doppler   parameters are consistent with abnormal left ventricular   relaxation (grade 1 diastolic dysfunction). The E/e&' ratio is   between 8-15, suggesting indeterminate LV filling pressure. - Aortic valve: Trileaflet. Sclerosis without stenosis. There was   trivial regurgitation. - Mitral valve: Mildly thickened leaflets . There was trivial   regurgitation. - Left atrium: The atrium was normal in size. - Tricuspid valve: There was mild regurgitation. - Pulmonary arteries: PA peak pressure: 30 mm Hg (S). - Inferior vena cava: The vessel was normal in size. The   respirophasic diameter changes were in the normal range (= 50%),   consistent with normal central venous pressure.   Impressions:   - LVEF 55-60%, normal wall thickness, normal wall motion, diastolic   dysfunction, indeterminate LV filling pressure, aortic valve   sclerosis with trivial AI, normal LA size, mild TR, RVSP 30 mmHg,   normal IVC.  ASSESSMENT:    1. Orthostatic hypotension  2. Persistent atrial fibrillation ([place])   3. Benign essential HTN   4. Carotid stenosis, bilateral       PLAN:  In order of problems listed  above:  Orthostatic hypotension  -her dizziness mainly occurs if she has been standing too long and has improved with Mestinon but unfortunately she developed some cholinergic side effects -need avoid ProAmatine and Florinef because of systolic blood pressures in the 150s. -Continue prescription drug management with Mestinon 30mg  BID >>decreased to due cholinergic side effects -continue tight fitting girdle and thigh high compression hose>>she tried an abdominal binder but had a hard time with it fitting -am Cortisol and thyroid panel were normal -she is also being followed by Dr. Caryl Comes -encouraged her to maintain 64oz of fluids daily  Persistent atrial fibrillation  -status post ablation 12 years ago with recurrent PAF  -She continues to maintain NSR on exam with no palpitations -she denies any bleeding problems on Eliquis -Continue prescription drug management with Eliquis 5mg  BID and Flecainide 50mg  BID with PRN Cardizem for breakthrough palpitations  -I have personally reviewed and interpreted outside PCP performed by patient's PCP which showed SCr 0.83, K+ 4.7in Dec 2021 -check CBC   Essential hypertension  -BP controlled on exam today  Carotid artery stenosis  -repeat dopplers 01/2019 stable with 1 to 39% left carotid stenosis -Repeat dopplers this month -no ASA due to DOAC -continue statin  HLD -LDL goal < 70 -I have personally reviewed and interpreted outside labs performed by patient's PCP which showed LDL 62, HDL 60 and ALT 11 in Dec 2021 but Crestor was decreased back to twice weekly after these labs due to SE -I will repeat FLP and ALT  -Continue prescription drug management with Crestor 5mg  twice weekly>>refilled for 1 year   Medication Adjustments/Labs and Tests Ordered: Current medicines are reviewed at length with the patient today.  Concerns regarding medicines are outlined above.  Medication changes, Labs and Tests ordered today are listed in the Patient  Instructions below. There are no Patient Instructions on file for this visit.   Signed, Fransico Him, MD  01/26/2021 10:07 AM    [address], [place], [place]  [address] Phone: [phone]; Fax: [phone]

## 2021-02-08 DIAGNOSIS — I480 Paroxysmal atrial fibrillation: Secondary | ICD-10-CM | POA: Diagnosis not present

## 2021-02-08 DIAGNOSIS — G4700 Insomnia, unspecified: Secondary | ICD-10-CM | POA: Diagnosis not present

## 2021-02-08 DIAGNOSIS — E7800 Pure hypercholesterolemia, unspecified: Secondary | ICD-10-CM | POA: Diagnosis not present

## 2021-02-08 DIAGNOSIS — I119 Hypertensive heart disease without heart failure: Secondary | ICD-10-CM | POA: Diagnosis not present

## 2021-02-08 DIAGNOSIS — M810 Age-related osteoporosis without current pathological fracture: Secondary | ICD-10-CM | POA: Diagnosis not present

## 2021-02-25 DIAGNOSIS — Z1231 Encounter for screening mammogram for malignant neoplasm of breast: Secondary | ICD-10-CM | POA: Diagnosis not present

## 2021-02-26 DIAGNOSIS — M810 Age-related osteoporosis without current pathological fracture: Secondary | ICD-10-CM | POA: Diagnosis not present

## 2021-02-26 DIAGNOSIS — K219 Gastro-esophageal reflux disease without esophagitis: Secondary | ICD-10-CM | POA: Diagnosis not present

## 2021-02-26 DIAGNOSIS — S00532A Contusion of oral cavity, initial encounter: Secondary | ICD-10-CM | POA: Diagnosis not present

## 2021-03-16 ENCOUNTER — Encounter: Payer: Self-pay | Admitting: Neurology

## 2021-03-16 ENCOUNTER — Ambulatory Visit: Payer: Medicare Other | Admitting: Neurology

## 2021-03-16 VITALS — Ht 65.0 in | Wt 172.8 lb

## 2021-03-16 DIAGNOSIS — I951 Orthostatic hypotension: Secondary | ICD-10-CM | POA: Diagnosis not present

## 2021-03-16 DIAGNOSIS — M6281 Muscle weakness (generalized): Secondary | ICD-10-CM | POA: Diagnosis not present

## 2021-03-16 NOTE — Progress Notes (Signed)
Reason for visit: Orthostatic hypotension, mild peripheral neuropathy  Rhonda [name] is an 80 y.o. female  History of present illness:  Rhonda [name] is a 80 year old right-handed white female with a history of orthostatic hypotension.  The patient has history of atrial fibrillation, and a low B12 level and a mild primarily sensory peripheral neuropathy.  The patient is followed through cardiology as well.  She reports generalized fatigue and exercise intolerance.  She reports constipation issues and some incontinence of the bladder.  She is on low-dose Mestinon taking 30 mg twice daily.  Her orthostasis is generally somewhat worse in the morning.  She sleeps flat in bed, but she does use compression stockings.  She tries to drink plenty of fluids and liberalizes salt in the diet.  She has not had any recent blackouts or falls.  The patient has not been exercising her legs.  She claims that she does not operate a motor vehicle.  She is limited with her time outside of the house as she has urinary urgency and incontinence.  She comes to the office today for an evaluation.  Past Medical History:  Diagnosis Date   Abscess 2021   abdominal cyst, ruptured, treated with Doxycycline    Adenomatous colon polyp    cecum, last colonoscopy 2006   Atrial tachycardia ([place])    non sustatined with PVCs and PACs   B12 deficiency 03/19/2020   Bacterial meningitis    as a child   Basilar migraine    Carotid stenosis, bilateral 10/13/2016   1-39% bilateral by dopplers 10/2016   Cystocele    MIld   History of hemorrhoids    Hx of chest pain    Atypical   Hx of hematuria    IVP 1990 left kidney stone, cystoscopy negative   Hyperlipidemia    Hyperlipidemia LDL goal <70 10/26/2017   Hypertension    Joint pain    Muscle weakness (generalized) 03/19/2020   Nephrolithiasis    OSA (obstructive sleep apnea) 10/26/2017   Severe with AHI 38/hr.  Patient refuses PAP therapy   Osteoporosis    actonel since  2006   PAF (paroxysmal atrial fibrillation) ([place])    radiofrequency ablation of afib [facility]   Right facial numbness 5/12   question TIA versus migraine,cardiac monitoring neg for AFIB   Seizures ([place]) 1980   7 years on Dilantin   Snoring    SOB (shortness of breath)    Chronic mild, negative cardiolite   Tinea versicolor     Past Surgical History:  Procedure Laterality Date   ABLATION     Heart   CHOLECYSTECTOMY     multiple ankle surgeries      Family History  Problem Relation Age of Onset   Parkinson's disease Mother    Heart attack Father    Sudden death Father     Social history:  reports that she has never smoked. She has never used smokeless tobacco. She reports that she does not currently use alcohol. She reports that she does not use drugs.    Allergies  Allergen Reactions   Erythromycin Other (See Comments)    Yeast infection   Pravastatin Sodium Other (See Comments)    Medications:  Prior to Admission medications   Medication Sig Start Date End Date Taking? Authorizing Provider  acetaminophen (TYLENOL) 325 MG tablet Take 650 mg by mouth every 6 (six) hours as needed for mild pain.    [provider]  alendronate (FOSAMAX)  70 MG tablet Take 70 mg by mouth once a week. Take with a full glass of water on an empty stomach.    [provider]  alum & mag hydroxide-simeth (MAALOX/MYLANTA) 200-200-20 MG/5ML suspension Take 30 mLs by mouth as needed for indigestion or heartburn.    [provider]  apixaban (ELIQUIS) 5 MG TABS tablet Take 1 tablet (5 mg total) by mouth 2 (two) times daily. 01/26/21   Rhonda Margarita, MD  Ascorbic Acid (VITAMIN C PO) Take 1 tablet by mouth daily.    [provider]  Bimatoprost (LUMIGAN OP) Apply 1 drop to eye daily. Each eye    [provider]  Cholecalciferol (VITAMIN D3 PO) Take 625 mg by mouth daily.    [provider]  diltiazem (CARDIZEM) 30 MG tablet Take 1 tablet (30 mg  total) by mouth every 4 (four) hours as needed (afib HR >100). 01/26/21   Rhonda Margarita, MD  flecainide (TAMBOCOR) 50 MG tablet Take 1 tablet (50 mg total) by mouth 2 (two) times daily. 01/26/21   Rhonda Margarita, MD  Loratadine (CLARITIN PO) Take 1 tablet by mouth daily as needed (allergies).     [provider]  Multiple Vitamins-Minerals (ANTIOXIDANT) CAPS Take by mouth daily in the afternoon.    [provider]  pyridostigmine (MESTINON) 60 MG tablet Take 0.5 tablets (30 mg total) by mouth in the morning and at bedtime. 01/26/21   Rhonda Margarita, MD  rosuvastatin (CRESTOR) 5 MG tablet Take 5 mg (1 tablet) twice weekly 01/26/21   Rhonda Margarita, MD  Sennosides (SENOKOT PO) Take 1 tablet by mouth daily as needed.     [provider]  vitamin B-12 (CYANOCOBALAMIN) 1000 MCG tablet Take 1,000 mcg by mouth daily.    [provider]    ROS:  Out of a complete 14 system review of symptoms, the patient complains only of the following symptoms, and all other reviewed systems are negative.  Fatigue, weakness Constipation Urinary incontinence  Height '5\' 5"'$  (1.651 m), weight 172 lb 12.8 oz (78.4 kg).  Blood pressure lying down is 127/77 with a heart rate of 66.  Sitting, the blood pressure is 128/71 with a heart rate of 73.  Standing the blood pressure is 99/61 with a heart rate of 68.  Physical Exam  General: The patient is alert and cooperative at the time of the examination.  Skin: No significant peripheral edema is noted.   Neurologic Exam  Mental status: The patient is alert and oriented x 3 at the time of the examination. The patient has apparent normal recent and remote memory, with an apparently normal attention span and concentration ability.   Cranial nerves: Facial symmetry is present. Speech is normal, no aphasia or dysarthria is noted. Extraocular movements are full. Visual fields are full.  Motor: The patient has good strength in all 4  extremities, with exception of 4/5 strength with hip flexion bilaterally.  Sensory examination: Soft touch sensation is symmetric on the face, arms, and legs.  Coordination: The patient has good finger-nose-finger and heel-to-shin bilaterally.  Gait and station: The patient has a normal gait. Tandem gait is unsteady.  The patient is not able to stand from a seated position with arms crossed.  Romberg is negative.  Reflexes: Deep tendon reflexes are symmetric.   Assessment/Plan:  1.  Orthostatic hypotension, autonomic dysfunction  2.  Mild peripheral neuropathy  The patient is not having any discomfort in the feet  from the neuropathy.  She is limited in her ability to perform physical activities in part from the orthostatic hypotension and in part from urinary incontinence issues.  I have recommended that she sleep with the head elevated instead of sleeping flat.  She can increase the Mestinon to 30 mg 3 times daily, she did not tolerate the 60 mg dosing.  She will follow-up here in 6 months.  In the future, she can be followed through Dr. Krista Blue.  Jill Alexanders MD 03/16/2021 11:05 AM  [facility] [address] [place], [place] [address]  Phone [phone] Fax [phone]

## 2021-03-24 ENCOUNTER — Other Ambulatory Visit: Payer: Self-pay | Admitting: Cardiology

## 2021-03-24 NOTE — Telephone Encounter (Signed)
Eliquis '5mg'$  refill request received. Patient is 80 years old, weight-78.4kg, Crea-0.83 on 06/30/2020, Diagnosis-Afib, and last seen by Dr. Radford Pax on 01/26/2021. Dose is appropriate based on dosing criteria. Will send in refill to requested pharmacy.

## 2021-03-31 ENCOUNTER — Other Ambulatory Visit: Payer: Self-pay | Admitting: Cardiology

## 2021-04-08 DIAGNOSIS — K921 Melena: Secondary | ICD-10-CM | POA: Diagnosis not present

## 2021-04-08 DIAGNOSIS — K146 Glossodynia: Secondary | ICD-10-CM | POA: Diagnosis not present

## 2021-04-08 DIAGNOSIS — K219 Gastro-esophageal reflux disease without esophagitis: Secondary | ICD-10-CM | POA: Diagnosis not present

## 2021-05-16 DIAGNOSIS — E7800 Pure hypercholesterolemia, unspecified: Secondary | ICD-10-CM | POA: Diagnosis not present

## 2021-05-16 DIAGNOSIS — I119 Hypertensive heart disease without heart failure: Secondary | ICD-10-CM | POA: Diagnosis not present

## 2021-05-16 DIAGNOSIS — I480 Paroxysmal atrial fibrillation: Secondary | ICD-10-CM | POA: Diagnosis not present

## 2021-05-16 DIAGNOSIS — K219 Gastro-esophageal reflux disease without esophagitis: Secondary | ICD-10-CM | POA: Diagnosis not present

## 2021-05-16 DIAGNOSIS — G4700 Insomnia, unspecified: Secondary | ICD-10-CM | POA: Diagnosis not present

## 2021-05-16 DIAGNOSIS — M810 Age-related osteoporosis without current pathological fracture: Secondary | ICD-10-CM | POA: Diagnosis not present

## 2021-05-17 DIAGNOSIS — K146 Glossodynia: Secondary | ICD-10-CM | POA: Diagnosis not present

## 2021-05-17 DIAGNOSIS — S00522A Blister (nonthermal) of oral cavity, initial encounter: Secondary | ICD-10-CM | POA: Diagnosis not present

## 2021-05-17 DIAGNOSIS — R220 Localized swelling, mass and lump, head: Secondary | ICD-10-CM | POA: Diagnosis not present

## 2021-05-18 DIAGNOSIS — K146 Glossodynia: Secondary | ICD-10-CM | POA: Diagnosis not present

## 2021-05-18 DIAGNOSIS — R220 Localized swelling, mass and lump, head: Secondary | ICD-10-CM | POA: Diagnosis not present

## 2021-05-18 DIAGNOSIS — S00522A Blister (nonthermal) of oral cavity, initial encounter: Secondary | ICD-10-CM | POA: Diagnosis not present

## 2021-05-18 DIAGNOSIS — Z8639 Personal history of other endocrine, nutritional and metabolic disease: Secondary | ICD-10-CM | POA: Diagnosis not present

## 2021-05-28 MED ORDER — PYRIDOSTIGMINE BROMIDE 60 MG PO TABS: 30.0000 mg | ORAL_TABLET | Freq: Three times a day (TID) | ORAL | 3 refills | Status: AC

## 2021-06-01 DIAGNOSIS — H401131 Primary open-angle glaucoma, bilateral, mild stage: Secondary | ICD-10-CM | POA: Diagnosis not present

## 2021-06-17 DIAGNOSIS — K1230 Oral mucositis (ulcerative), unspecified: Secondary | ICD-10-CM | POA: Diagnosis not present

## 2021-06-22 ENCOUNTER — Other Ambulatory Visit: Payer: Self-pay | Admitting: Cardiology

## 2021-06-22 DIAGNOSIS — I6523 Occlusion and stenosis of bilateral carotid arteries: Secondary | ICD-10-CM

## 2021-06-22 DIAGNOSIS — E785 Hyperlipidemia, unspecified: Secondary | ICD-10-CM

## 2021-06-22 DIAGNOSIS — I4819 Other persistent atrial fibrillation: Secondary | ICD-10-CM

## 2021-06-22 DIAGNOSIS — I10 Essential (primary) hypertension: Secondary | ICD-10-CM

## 2021-06-22 DIAGNOSIS — G4733 Obstructive sleep apnea (adult) (pediatric): Secondary | ICD-10-CM

## 2021-07-09 DIAGNOSIS — K921 Melena: Secondary | ICD-10-CM | POA: Diagnosis not present

## 2021-07-09 DIAGNOSIS — R1030 Lower abdominal pain, unspecified: Secondary | ICD-10-CM | POA: Diagnosis not present

## 2021-07-22 ENCOUNTER — Telehealth: Payer: Self-pay | Admitting: *Deleted

## 2021-07-22 ENCOUNTER — Other Ambulatory Visit: Payer: Self-pay | Admitting: Gastroenterology

## 2021-07-22 DIAGNOSIS — R1030 Lower abdominal pain, unspecified: Secondary | ICD-10-CM | POA: Diagnosis not present

## 2021-07-22 DIAGNOSIS — K625 Hemorrhage of anus and rectum: Secondary | ICD-10-CM | POA: Diagnosis not present

## 2021-07-22 DIAGNOSIS — Z86010 Personal history of colonic polyps: Secondary | ICD-10-CM | POA: Diagnosis not present

## 2021-07-22 DIAGNOSIS — K644 Residual hemorrhoidal skin tags: Secondary | ICD-10-CM | POA: Diagnosis not present

## 2021-07-22 DIAGNOSIS — K5900 Constipation, unspecified: Secondary | ICD-10-CM | POA: Diagnosis not present

## 2021-07-22 NOTE — Telephone Encounter (Signed)
Will route to pharm re: anticoag then pt will need call. Last OV 01/2021.

## 2021-07-22 NOTE — Telephone Encounter (Signed)
° °  Pre-operative Risk Assessment    Patient Name: Rhonda [name]  DOB: 10-14-1940 MRN: [id number]      Request for Surgical Clearance    Procedure:   COLONOSCOPY  Date of Surgery:  Clearance 08/03/21 URGENT                               Surgeon:  DR. [facility] Surgeon's Group or Practice Name:  [facility] Phone number:  [phone] Fax number:  [phone]   Type of Clearance Requested:   - Medical  - Pharmacy:  Hold Apixaban (Eliquis)     Type of Anesthesia:   PROPOFOL   Additional requests/questions:    Jiles Prows   07/22/2021, 5:04 PM

## 2021-07-23 ENCOUNTER — Encounter (HOSPITAL_COMMUNITY): Payer: Self-pay | Admitting: Gastroenterology

## 2021-07-23 NOTE — Telephone Encounter (Signed)
See notes from pre op provider Coletta Memos, FNP today. Will defer clearance to Dr. Radford Pax appt 07/27/21. Once Dr. Radford Pax clears the pt she will fax over her ov note with clearance and any recommendations. Will send FYI to requesting office the pt has appt with Dr. Radford Pax 07/27/21.

## 2021-07-23 NOTE — Telephone Encounter (Addendum)
Patient contacted today as part of preoperative cardiac evaluation.  She has several complaints relating to GI upset and bleeding episodes.  She complains of intermittent episodes of chest discomfort which she believes are related to gas, generalized weakness, and lightheadedness.  I will defer her preoperative cardiac evaluation to Dr. Radford Pax during her upcoming 07/27/2021 appointment.  Please add preoperative cardiac evaluation to her appointment note.  Thank you.   Jossie Ng. Keeana Pieratt NP-C    07/23/2021, 9:00 AM [place] [address] Office [phone] Fax [phone]

## 2021-07-23 NOTE — Telephone Encounter (Signed)
Patient with diagnosis of A fib on Eliquis for anticoagulation.    Procedure: COLONOSCOPY Date of procedure: Clearance 08/03/21    CHA2DS2-VASc Score = 7  This indicates a 11.2% annual risk of stroke. The patient's score is based upon: CHF History: 0 HTN History: 1 Diabetes History: 0 Stroke History: 2 (Unclear?) Vascular Disease History: 1 Age Score: 2 Gender Score: 1    CrCl 121 mL/min Platelet count 210K   Per office protocol, patient can hold Eliquis for 1 days prior to procedure.

## 2021-07-23 NOTE — Telephone Encounter (Signed)
Patient with diagnosis of A fib on Eliquis for anticoagulation.     Procedure: COLONOSCOPY Date of procedure: Clearance 08/03/21      CHA2DS2-VASc Score = 7  This indicates a 11.2% annual risk of stroke. The patient's score is based upon: CHF History: 0 HTN History: 1 Diabetes History: 0 Stroke History: 2 (Unclear?) Vascular Disease History: 1 Age Score: 2 Gender Score: 1     CrCl 66 mL/min Platelet count 210K     Per office protocol, patient can hold Eliquis for 1 days prior to procedure.

## 2021-07-27 ENCOUNTER — Other Ambulatory Visit: Payer: Self-pay

## 2021-07-27 ENCOUNTER — Telehealth: Payer: Self-pay

## 2021-07-27 ENCOUNTER — Telehealth (INDEPENDENT_AMBULATORY_CARE_PROVIDER_SITE_OTHER): Payer: Medicare Other | Admitting: Cardiology

## 2021-07-27 ENCOUNTER — Encounter: Payer: Self-pay | Admitting: Cardiology

## 2021-07-27 VITALS — BP 110/71 | Ht 64.5 in | Wt 164.0 lb

## 2021-07-27 DIAGNOSIS — I951 Orthostatic hypotension: Secondary | ICD-10-CM | POA: Diagnosis not present

## 2021-07-27 DIAGNOSIS — I10 Essential (primary) hypertension: Secondary | ICD-10-CM | POA: Diagnosis not present

## 2021-07-27 DIAGNOSIS — I4819 Other persistent atrial fibrillation: Secondary | ICD-10-CM

## 2021-07-27 DIAGNOSIS — E785 Hyperlipidemia, unspecified: Secondary | ICD-10-CM

## 2021-07-27 DIAGNOSIS — I6523 Occlusion and stenosis of bilateral carotid arteries: Secondary | ICD-10-CM | POA: Diagnosis not present

## 2021-07-27 NOTE — Telephone Encounter (Signed)
-----   Message from Sueanne Margarita, MD sent at 07/27/2021 12:39 PM EST ----- Pleaes find out if patient has started any new OTC meds in the past few months.  If no then have her hold Mestinon for 2 weeks and call back to let me know if her tongue and throat swelling and tongue biting at night continue ----- Message ----- From: Leeroy Bock, RPH-CPP Sent: 07/27/2021  12:31 PM EST To: Sueanne Margarita, MD  I don't see that angioedema or hypersensitivity reactions are that common with Mestinon but they could still be a possibility. The best way to tell if her symptoms are coming from the Mestinon would be to stop it and see if her symptoms improve, but I hate to do that if she can't take Florinef or proamatine either. If she's started any newer medications (either rx or OTC) in the past few months, could try stopping them first instead to see if symptoms resolve. ----- Message ----- From: Sueanne Margarita, MD Sent: 07/27/2021  10:52 AM EST To: Leeroy Bock, RPH-CPP  Megan  This patient has been on Mestinon for orthostatic hypotension ( see my note but cannot take Florinef or proamatine).  Had cholinergic effects on higher doses of mestinon and now having tongue and throat swelling intermittently that is new - can you look at her meds to see whats meds could cause this - I am worried it may be mestinon

## 2021-07-27 NOTE — Patient Instructions (Signed)
Medication Instructions:  Your physician recommends that you continue on your current medications as directed. Please refer to the Current Medication list given to you today.  *If you need a refill on your cardiac medications before your next appointment, please call your pharmacy*   Lab Work: Your physician recommends that you return for lab work in: Today   If you have labs (blood work) drawn today and your tests are completely normal, you will receive your results only by: MyChart Message (if you have MyChart) OR A paper copy in the mail If you have any lab test that is abnormal or we need to change your treatment, we will call you to review the results.   Testing/Procedures: Your physician has requested that you have a carotid duplex. This test is an ultrasound of the carotid arteries in your neck. It looks at blood flow through these arteries that supply the brain with blood. Allow one hour for this exam. There are no restrictions or special instructions.    Follow-Up: At [facility], you and your health needs are our priority.  As part of our continuing mission to provide you with exceptional heart care, we have created designated Provider Care Teams.  These Care Teams include your primary Cardiologist (physician) and Advanced Practice Providers (APPs -  Physician Assistants and Nurse Practitioners) who all work together to provide you with the care you need, when you need it.  We recommend signing up for the patient portal called "MyChart".  Sign up information is provided on this After Visit Summary.  MyChart is used to connect with patients for Virtual Visits (Telemedicine).  Patients are able to view lab/test results, encounter notes, upcoming appointments, etc.  Non-urgent messages can be sent to your provider as well.   To learn more about what you can do with MyChart, go to [email or web address].    Your next appointment:   3 month(s)  The format for your next  appointment:   In Person  Provider:   Fransico Him, MD     Other Instructions Thank you for choosing [facility]!

## 2021-07-27 NOTE — Addendum Note (Signed)
Addended by: Levonne Hubert on: 07/27/2021 11:28 AM   Modules accepted: Orders

## 2021-07-27 NOTE — Addendum Note (Signed)
Addended by: Levonne Hubert on: 07/27/2021 11:06 AM   Modules accepted: Orders

## 2021-07-27 NOTE — Telephone Encounter (Signed)
Spoke with patient who denies beginning any new OTC medications or supplements. Pt states that she doesn't take anything regularly OTC either. Advised pt of Dr Theodosia Blender recommendation to hold Mestinon for 2 weeks and call back to report if symptoms of tongue/throat swelling and tongue biting have improved. Pt states that she has taken her dose today, but will start tomorrow with holding medication. Pt endorses understanding and confirms that she will hold medication beginning tomorrow (1/11 and continuing through 1/25). Pt states she has an appt on 08/19/21 with an ENT physician and is eager to see if this improves her condition. Will call back and report findings. Judson Roch, RN

## 2021-07-27 NOTE — Progress Notes (Signed)
Virtual Visit via Video Note   This visit type was conducted due to national recommendations for restrictions regarding the COVID-19 Pandemic (e.g. social distancing) in an effort to limit this patient's exposure and mitigate transmission in our community.  Due to her co-morbid illnesses, this patient is at least at moderate risk for complications without adequate follow up.  This format is felt to be most appropriate for this patient at this time.  All issues noted in this document were discussed and addressed.  A limited physical exam was performed with this format.  Please refer to the patient's chart for her consent to telehealth for [facility].   Date:  07/27/2021   ID:  Rhonda [name], DOB 06/21/1941, MRN [id number] The patient was identified using 2 identifiers.  Patient Location: Home Provider Location: Home Office   PCP:  Rhonda Orn, MD   [facility] Providers Cardiologist:  Rhonda Him, MD     Evaluation Performed:  Follow-Up Visit  Chief Complaint:  Orthostatic Hypotension  History of Present Illness:    Rhonda [name] is a 81 y.o. female with a hx of persistent AF s/p afib ablation 12 years ago,  bilateral carotid stenosis < 50%, PVC's, PAC's and nonsustained atrial tachycardia. She has been followed in afib clinic with Rhonda [name]..   Patient did not want repeat ablation and is now on flecainide.  She has a CHADS2VASC score of 4 and is on Eliquis for anticoagulation.     She underwent home sleep study showing severe sleep apnea with an AHI of 38.1/h.  She underwent CPAP titration but could not be adequately titrated due to ongoing respiratory events.  A BiPAP titration was recommended but patient refused any further studies and stated that she was not going to use a Pap device.   She went back to see Rhonda [name] who had done her original ablation and she had a stress test and Ziopatch.  The stress test was fine and he placed her on Flecainide.     She saw me  02/13/2020 was having problems with orthostatic hypotension and dizziness despite being placed on Florinef 0.1mg  daily which had helped her symptoms but she was still having symptoms so was given prescription for abdominal binder and thigh-high compression hose.    She was seen back by Rhonda Husk, PA and was doing better .  She was wearing compression stockings but couldn't get the abdominal binder to fit so she got a girdle type from [employer].    Her PCP had increased her Florinef and BP was trending upward and she was having palpitations.  She decreased her Florinef back to 0.1mg  daily but BP elevated persisted  I stopped her Florinef and increased Mestinon to 30mg  BID.  At last OV she was still having problems with dizziness when standing up and BP readings sometimes could not be calibrated when she checked it. She got an abdominal binder but unfortunately it does not fit well.  Her Mestinon was increased to 60mg  BID but due to SE, it was decreased to 30mg  BID when she was seen by Rhonda [name].    She is here today for followup.  She has been having rectal bleeding intermittently since September and needs clearance for colonoscopy next week. She occasionally has some GERD sx at night. She denies any exertional chest pain or pressure.  She has had some problems recently with SOB and fatigue recently but she has been anemic with Hbg 10.3 and Hct 30 recently.  She has had some problems with biting her tongue at night and wakes up with blood in her mouth.  She saw her dentist and they recommended a mouth guard at night but she has not gotten that done.  She also has had problems with her tongue swelling intermittently and several times has awakened with her throat almost closed off.  She denies any PND, orthopnea, LE edema, dizziness, palpitations or syncope. She is compliant with her meds and is tolerating meds with no SE.     The patient does not have symptoms concerning for COVID-19 infection (fever, chills,  cough, or new shortness of breath).    Past Medical History:  Diagnosis Date   Abscess 2021   abdominal cyst, ruptured, treated with Doxycycline    Adenomatous colon polyp    cecum, last colonoscopy 2006   Atrial tachycardia ([place])    non sustatined with PVCs and PACs   B12 deficiency 03/19/2020   Bacterial meningitis    as a child   Basilar migraine    Carotid stenosis, bilateral 10/13/2016   1-39% bilateral by dopplers 10/2016   Cystocele    MIld   History of hemorrhoids    Hx of chest pain    Atypical   Hx of hematuria    IVP 1990 left kidney stone, cystoscopy negative   Hyperlipidemia    Hyperlipidemia LDL goal <70 10/26/2017   Hypertension    Joint pain    Muscle weakness (generalized) 03/19/2020   Nephrolithiasis    OSA (obstructive sleep apnea) 10/26/2017   Severe with AHI 38/hr.  Patient refuses PAP therapy   Osteoporosis    actonel since 2006   PAF (paroxysmal atrial fibrillation) ([place])    radiofrequency ablation of afib [facility]   Right facial numbness 5/12   question TIA versus migraine,cardiac monitoring neg for AFIB   Seizures ([place]) 1980   7 years on Dilantin   Snoring    SOB (shortness of breath)    Chronic mild, negative cardiolite   Tinea versicolor    Past Surgical History:  Procedure Laterality Date   ABLATION     Heart   CHOLECYSTECTOMY     multiple ankle surgeries       Current Meds  Medication Sig   acetaminophen (TYLENOL) 500 MG tablet Take 500 mg by mouth every 6 (six) hours as needed for headache.   alum & mag hydroxide-simeth (MAALOX/MYLANTA) 200-200-20 MG/5ML suspension Take 30 mLs by mouth as needed for indigestion or heartburn.   apixaban (ELIQUIS) 5 MG TABS tablet TAKE 1 TABLET BY MOUTH  TWICE DAILY   Ascorbic Acid (VITAMIN C PO) Take 1 tablet by mouth daily.   Cholecalciferol (VITAMIN D3 PO) Take 625 mg by mouth daily.   flecainide (TAMBOCOR) 50 MG tablet TAKE 1 TABLET BY MOUTH  TWICE DAILY   latanoprost (XALATAN) 0.005 %  ophthalmic solution Place 1 drop into both eyes at bedtime.   loratadine (CLARITIN) 10 MG tablet Take 1 tablet by mouth daily as needed (allergies).    pantoprazole (PROTONIX) 40 MG tablet Take 40 mg by mouth daily.   pyridostigmine (MESTINON) 60 MG tablet Take 0.5 tablets (30 mg total) by mouth 3 (three) times daily.   rosuvastatin (CRESTOR) 5 MG tablet TAKE 1 TABLET BY MOUTH 2 DAYS A WEEK OR AS TOLERATED   senna (SENOKOT) 8.6 MG tablet Take 1 tablet by mouth daily as needed (Constipation).   vitamin B-12 (CYANOCOBALAMIN) 1000 MCG tablet Take 1,000 mcg by mouth daily.  Current Facility-Administered Medications for the 07/27/21 encounter (Video Visit) with Sueanne Margarita, MD  Medication   cyanocobalamin ((VITAMIN B-12)) injection 1,000 mcg     Allergies:   Erythromycin and Pravastatin sodium   Social History   Tobacco Use   Smoking status: Never   Smokeless tobacco: Never  Vaping Use   Vaping Use: Never used  Substance Use Topics   Alcohol use: Not Currently    Comment: 1-2 glasses of wine weekly   Drug use: No     Family Hx: The patient's family history includes Heart attack in her father; Parkinson's disease in her mother; Sudden death in her father.  ROS:   Please see the history of present illness.     All other systems reviewed and are negative.   Prior CV studies:   The following studies were reviewed today:  None  Labs/Other Tests and Data Reviewed:    EKG:  No ECG reviewed.  Recent Labs: 01/26/2021: ALT 8; Hemoglobin 11.4; Platelets 210   Recent Lipid Panel Lab Results  Component Value Date/Time   CHOL 131 01/26/2021 10:17 AM   TRIG 108 01/26/2021 10:17 AM   HDL 56 01/26/2021 10:17 AM   CHOLHDL 2.3 01/26/2021 10:17 AM   LDLCALC 55 01/26/2021 10:17 AM    Wt Readings from Last 3 Encounters:  07/27/21 164 lb (74.4 kg)  03/16/21 172 lb 12.8 oz (78.4 kg)  01/26/21 168 lb 9.6 oz (76.5 kg)     Risk Assessment/Calculations:    CHA2DS2-VASc Score = 7   This indicates a 11.2% annual risk of stroke. The patient's score is based upon: CHF History: 0 HTN History: 1 Diabetes History: 0 Stroke History: 2 (Unclear?) Vascular Disease History: 1 Age Score: 2 Gender Score: 1      Objective:    Vital Signs:  BP 110/71    Ht 5' 4.5" (1.638 m)    Wt 164 lb (74.4 kg)    SpO2 (!) 75%    BMI 27.72 kg/m    VITAL SIGNS:  reviewed GEN:  no acute distress EYES:  sclerae anicteric, EOMI - Extraocular Movements Intact RESPIRATORY:  normal respiratory effort, symmetric expansion CARDIOVASCULAR:  no peripheral edema SKIN:  no rash, lesions or ulcers. MUSCULOSKELETAL:  no obvious deformities. NEURO:  alert and oriented x 3, no obvious focal deficit PSYCH:  normal affect  ASSESSMENT & PLAN:    Orthostatic hypotension  -her dizziness mainly occurs if she has been standing too long and has improved with Mestinon but unfortunately she developed some cholinergic side effects -need avoid ProAmatine and Florinef because of systolic blood pressures in the 150s. -she is now having problems with tongue biting and tongue and throat swelling and I am concerned that this could be related to her Mestinon.  I will discuss with pharmacy whether those are side effects of Mestinon.  -she is seeing Neurology in the near future -For now, Continue prescription drug management with Mestinon 30 mg twice daily with as needed refills-she does not tolerate higher doses due to cholinergic side effects -She will continue to use tight fitting girdle as she does not tolerate an abdominal binder as well as thigh-high compression hose  -am Cortisol and thyroid panel were normal -she is also being followed by Rhonda [name] -encouraged her to maintain 64oz of fluids daily   Persistent atrial fibrillation  -status post ablation 12 years ago with recurrent PAF  -She continues to maintain normal sinus rhythm and has not had any palpitations. -She  recently has had problems with anemia  and rectal bleeding and is scheduled for colonoscopy next week with I think she is fine to proceed with that and [name] to hold Eliquis for 48 hours prior -Continue prescription drug management with Eliquis 5 mg twice daily (if [name] with GI) and flecainide 50 mg twice daily with as needed refills.  She will also continue on as needed Cardizem for breakthrough palpitations  -Check BMP and CBC  Essential hypertension  -BP controlled on exam today   Carotid artery stenosis  -repeat dopplers 01/2019 stable with 1 to 39% left carotid stenosis -She is due for repeat carotid Dopplers -no ASA due to DOAC -continue statin   HLD -LDL goal < 70 -I have personally reviewed and interpreted outside labs performed by patient's PCP which showed LDL 55, HDL 56, tags 108 on 01/26/2021 -Continue prescription drug management with Crestor 5 mg twice weekly with as needed refills     COVID-19 Education: The signs and symptoms of COVID-19 were discussed with the patient and how to seek care for testing (follow up with PCP or arrange E-visit).  The importance of social distancing was discussed today.  Time:   Today, I have spent 20 minutes with the patient with telehealth technology discussing the above problems.     Medication Adjustments/Labs and Tests Ordered: Current medicines are reviewed at length with the patient today.  Concerns regarding medicines are outlined above.   Tests Ordered: No orders of the defined types were placed in this encounter.   Medication Changes: No orders of the defined types were placed in this encounter.   Follow Up:  In Person in 6 month(s)  Signed, Rhonda Him, MD  07/27/2021 10:38 AM    [place]

## 2021-07-28 ENCOUNTER — Other Ambulatory Visit: Payer: Medicare Other | Admitting: *Deleted

## 2021-07-28 DIAGNOSIS — I4819 Other persistent atrial fibrillation: Secondary | ICD-10-CM | POA: Diagnosis not present

## 2021-07-28 DIAGNOSIS — I10 Essential (primary) hypertension: Secondary | ICD-10-CM | POA: Diagnosis not present

## 2021-07-29 LAB — CBC
Hematocrit: 27.2 % — ABNORMAL LOW (ref 34.0–46.6)
Hemoglobin: 9.5 g/dL — ABNORMAL LOW (ref 11.1–15.9)
MCH: 31.7 pg (ref 26.6–33.0)
MCHC: 34.9 g/dL (ref 31.5–35.7)
MCV: 91 fL (ref 79–97)
Platelets: 255 10*3/uL (ref 150–450)
RBC: 3 x10E6/uL — ABNORMAL LOW (ref 3.77–5.28)
RDW: 12.9 % (ref 11.7–15.4)
WBC: 5.6 10*3/uL (ref 3.4–10.8)

## 2021-07-29 LAB — BASIC METABOLIC PANEL
BUN/Creatinine Ratio: 14 (ref 12–28)
BUN: 11 mg/dL (ref 8–27)
CO2: 22 mmol/L (ref 20–29)
Calcium: 9.7 mg/dL (ref 8.7–10.3)
Chloride: 106 mmol/L (ref 96–106)
Creatinine, Ser: 0.77 mg/dL (ref 0.57–1.00)
Glucose: 95 mg/dL (ref 70–99)
Potassium: 4.4 mmol/L (ref 3.5–5.2)
Sodium: 140 mmol/L (ref 134–144)
eGFR: 78 mL/min/{1.73_m2} (ref >59)

## 2021-08-03 ENCOUNTER — Ambulatory Visit (HOSPITAL_COMMUNITY): Payer: Medicare Other | Admitting: Anesthesiology

## 2021-08-03 ENCOUNTER — Ambulatory Visit (HOSPITAL_COMMUNITY)
Admission: RE | Admit: 2021-08-03 | Discharge: 2021-08-03 | Disposition: A | Discharge status: Home / Self Care | Payer: Medicare Other | Attending: Gastroenterology | Admitting: Gastroenterology

## 2021-08-03 ENCOUNTER — Encounter (HOSPITAL_COMMUNITY)
Admission: RE | Disposition: A | Discharge status: Home / Self Care | Payer: Self-pay | Source: Home / Self Care | Attending: Gastroenterology

## 2021-08-03 ENCOUNTER — Encounter (HOSPITAL_COMMUNITY): Payer: Self-pay | Admitting: Gastroenterology

## 2021-08-03 DIAGNOSIS — Z79899 Other long term (current) drug therapy: Secondary | ICD-10-CM | POA: Insufficient documentation

## 2021-08-03 DIAGNOSIS — K644 Residual hemorrhoidal skin tags: Secondary | ICD-10-CM | POA: Diagnosis not present

## 2021-08-03 DIAGNOSIS — I10 Essential (primary) hypertension: Secondary | ICD-10-CM | POA: Diagnosis not present

## 2021-08-03 DIAGNOSIS — I739 Peripheral vascular disease, unspecified: Secondary | ICD-10-CM | POA: Insufficient documentation

## 2021-08-03 DIAGNOSIS — K529 Noninfective gastroenteritis and colitis, unspecified: Secondary | ICD-10-CM | POA: Diagnosis not present

## 2021-08-03 DIAGNOSIS — G4733 Obstructive sleep apnea (adult) (pediatric): Secondary | ICD-10-CM | POA: Diagnosis not present

## 2021-08-03 DIAGNOSIS — D122 Benign neoplasm of ascending colon: Secondary | ICD-10-CM | POA: Insufficient documentation

## 2021-08-03 DIAGNOSIS — Z86010 Personal history of colonic polyps: Secondary | ICD-10-CM | POA: Diagnosis not present

## 2021-08-03 DIAGNOSIS — K6389 Other specified diseases of intestine: Secondary | ICD-10-CM | POA: Insufficient documentation

## 2021-08-03 DIAGNOSIS — Z8673 Personal history of transient ischemic attack (TIA), and cerebral infarction without residual deficits: Secondary | ICD-10-CM | POA: Insufficient documentation

## 2021-08-03 DIAGNOSIS — K648 Other hemorrhoids: Secondary | ICD-10-CM | POA: Insufficient documentation

## 2021-08-03 DIAGNOSIS — E538 Deficiency of other specified B group vitamins: Secondary | ICD-10-CM

## 2021-08-03 DIAGNOSIS — K633 Ulcer of intestine: Secondary | ICD-10-CM | POA: Insufficient documentation

## 2021-08-03 DIAGNOSIS — K5900 Constipation, unspecified: Secondary | ICD-10-CM | POA: Insufficient documentation

## 2021-08-03 DIAGNOSIS — I480 Paroxysmal atrial fibrillation: Secondary | ICD-10-CM | POA: Insufficient documentation

## 2021-08-03 DIAGNOSIS — D500 Iron deficiency anemia secondary to blood loss (chronic): Secondary | ICD-10-CM | POA: Diagnosis not present

## 2021-08-03 DIAGNOSIS — D120 Benign neoplasm of cecum: Secondary | ICD-10-CM | POA: Insufficient documentation

## 2021-08-03 DIAGNOSIS — K635 Polyp of colon: Secondary | ICD-10-CM | POA: Diagnosis not present

## 2021-08-03 DIAGNOSIS — K625 Hemorrhage of anus and rectum: Secondary | ICD-10-CM | POA: Diagnosis not present

## 2021-08-03 HISTORY — PX: POLYPECTOMY: SHX5525

## 2021-08-03 HISTORY — PX: BIOPSY: SHX5522

## 2021-08-03 HISTORY — PX: COLONOSCOPY WITH PROPOFOL: SHX5780

## 2021-08-03 SURGERY — COLONOSCOPY WITH PROPOFOL
Anesthesia: Monitor Anesthesia Care

## 2021-08-03 MED ORDER — PROPOFOL 500 MG/50ML IV EMUL
INTRAVENOUS | Status: AC
  Filled 2021-08-03: qty 50

## 2021-08-03 MED ORDER — LACTATED RINGERS IV SOLN: INTRAVENOUS | Status: DC

## 2021-08-03 MED ORDER — PROPOFOL 10 MG/ML IV BOLUS
INTRAVENOUS | Status: DC | PRN
  Administered 2021-08-03 (×2): 20 mg via INTRAVENOUS

## 2021-08-03 MED ORDER — PROPOFOL 500 MG/50ML IV EMUL
INTRAVENOUS | Status: DC | PRN
  Administered 2021-08-03: 125 ug/kg/min via INTRAVENOUS

## 2021-08-03 MED ORDER — SODIUM CHLORIDE 0.9 % IV SOLN: INTRAVENOUS | Status: DC

## 2021-08-03 SURGICAL SUPPLY — 22 items

## 2021-08-03 NOTE — Anesthesia Preprocedure Evaluation (Signed)
Anesthesia Evaluation  Patient identified by MRN, date of birth, ID band Patient awake    Reviewed: Allergy & Precautions, NPO status , Patient's Chart, lab work & pertinent test results  Airway Mallampati: II  TM Distance: >3 FB Neck ROM: Full    Dental no notable dental hx.    Pulmonary sleep apnea ,    Pulmonary exam normal breath sounds clear to auscultation       Cardiovascular hypertension, + Peripheral Vascular Disease  Normal cardiovascular exam Rhythm:Regular Rate:Normal     Neuro/Psych Seizures -, Well Controlled,  negative psych ROS   GI/Hepatic negative GI ROS, Neg liver ROS,   Endo/Other  negative endocrine ROS  Renal/GU negative Renal ROS  negative genitourinary   Musculoskeletal negative musculoskeletal ROS (+)   Abdominal   Peds negative pediatric ROS (+)  Hematology negative hematology ROS (+)   Anesthesia Other Findings   Reproductive/Obstetrics negative OB ROS                             Anesthesia Physical Anesthesia Plan  ASA: 3  Anesthesia Plan: MAC   Post-op Pain Management: Minimal or no pain anticipated   Induction: Intravenous  PONV Risk Score and Plan: 2 and Propofol infusion and Treatment may vary due to age or medical condition  Airway Management Planned: Simple Face Mask  Additional Equipment:   Intra-op Plan:   Post-operative Plan:   Informed Consent: I have reviewed the patients History and Physical, chart, labs and discussed the procedure including the risks, benefits and alternatives for the proposed anesthesia with the patient or authorized representative who has indicated his/her understanding and acceptance.     Dental advisory given  Plan Discussed with: CRNA and Surgeon  Anesthesia Plan Comments:         Anesthesia Quick Evaluation

## 2021-08-03 NOTE — Op Note (Signed)
[facility] Patient Name: Rhonda [name] Procedure Date: 08/03/2021 MRN: [id number] Attending MD: Ronnette Juniper , MD Date of Birth: 11-Mar-1941 CSN: [id number] Age: 81 Admit Type: Outpatient Procedure:                Colonoscopy Indications:              Last colonoscopy: 2018, Rectal bleeding, Iron                            deficiency anemia secondary to chronic blood loss,                            Follow-up for history of adenomatous polyps in the                            colon Providers:                Ronnette Juniper, MD, Jaci Carrel, RN, [facility]                            Technician, Technician Referring MD:             Celene Kras Medicines:                Monitored Anesthesia Care Complications:            No immediate complications. Estimated blood loss:                            Minimal. Estimated Blood Loss:     Estimated blood loss was minimal. Procedure:                Pre-Anesthesia Assessment:                           - Prior to the procedure, a History and Physical                            was performed, and patient medications and                            allergies were reviewed. The patient's tolerance of                            previous anesthesia was also reviewed. The risks                            and benefits of the procedure and the sedation                            options and risks were discussed with the patient.                            All questions were answered, and informed consent                            was obtained. Prior Anticoagulants: The  patient has                            taken Eliquis (apixaban), last dose was 1 day prior                            to procedure. ASA Grade Assessment: IV - A patient                            with severe systemic disease that is a constant                            threat to life. After reviewing the risks and                            benefits, the patient was deemed  in satisfactory                            condition to undergo the procedure.                           After obtaining informed consent, the colonoscope                            was passed under direct vision. Throughout the                            procedure, the patient's blood pressure, pulse, and                            oxygen saturations were monitored continuously. The                            PCF-HQ190L ([id number]) Olympus colonoscope was                            introduced through the anus and advanced to the the                            terminal ileum. The colonoscopy was performed                            without difficulty. The patient tolerated the                            procedure well. The quality of the bowel                            preparation was adequate to identify polyps 6 mm                            and larger in size. Scope In: 9:39:28 AM Scope Out: 10:11:33 AM Scope Withdrawal Time: 0 hours 20 minutes 0 seconds  Total Procedure Duration: 0  hours 32 minutes 5 seconds  Findings:      Hemorrhoids were found on perianal exam.      The terminal ileum appeared normal.      Three sessile polyps were found in the ascending colon(2) and cecum(1).       The polyps were 5 to 10 mm in size. These polyps were removed with a hot       snare and cold biopsy forceps. Resection and retrieval were complete.      A patchy area of severely congested, erythematous, inflamed and       ulcerated mucosa was found in the sigmoid colon, in the descending colon       and in the transverse colon. Biopsies were taken with a cold forceps for       histology.      Non-bleeding internal hemorrhoids were found during retroflexion. The       hemorrhoids were large.      A tattoo was seen at the hepatic flexure. The tattoo site appeared       normal. Impression:               - Hemorrhoids found on perianal exam.                           - The examined portion of the ileum  was normal.                           - Three 5 to 10 mm polyps in the ascending colon                            and in the cecum, removed with a hot snare.                            Resected and retrieved.                           - Congested, erythematous, inflamed and ulcerated                            mucosa in the sigmoid colon, in the descending                            colon and in the transverse colon. Biopsied.                           - Non-bleeding internal hemorrhoids.                           - A tattoo was seen at the hepatic flexure. The                            tattoo site appeared normal. Moderate Sedation:      Patient did not receive moderate sedation for this procedure, but       instead received monitored anesthesia care. Recommendation:           - Patient has a contact number available for  emergencies. The signs and symptoms of potential                            delayed complications were discussed with the                            patient. Return to normal activities tomorrow.                            Written discharge instructions were provided to the                            patient.                           - Resume regular diet.                           - Continue present medications.                           - Await pathology results.                           - Repeat colonoscopy for surveillance based on                            pathology results.                           - Resume Eliquis (apixaban) at prior dose tomorrow. Procedure Code(s):        --- Professional ---                           [id number], Colonoscopy, flexible; with removal of                            tumor(s), polyp(s), or other lesion(s) by snare                            technique                           45380, 79, Colonoscopy, flexible; with biopsy,                            single or multiple Diagnosis Code(s):        ---  Professional ---                           K64.8, Other hemorrhoids                           K63.5, Polyp of colon                           K63.89, Other specified diseases of intestine  K52.9, Noninfective gastroenteritis and colitis,                            unspecified                           K63.3, Ulcer of intestine                           K62.5, Hemorrhage of anus and rectum                           D50.0, Iron deficiency anemia secondary to blood                            loss (chronic)                           Z86.010, Personal history of colonic polyps CPT copyright 2019 [employer]. All rights reserved. The codes documented in this report are preliminary and upon coder review may  be revised to meet current compliance requirements. Ronnette Juniper, MD 08/03/2021 10:21:52 AM This report has been signed electronically. Number of Addenda: 0

## 2021-08-03 NOTE — Anesthesia Postprocedure Evaluation (Signed)
Anesthesia Post Note  Patient: Rhonda [name]  Procedure([name]) Performed: COLONOSCOPY WITH PROPOFOL BIOPSY POLYPECTOMY     Patient location during evaluation: PACU Anesthesia Type: MAC Level of consciousness: awake and alert Pain management: pain level controlled Vital Signs Assessment: post-procedure vital signs reviewed and stable Respiratory status: spontaneous breathing, nonlabored ventilation, respiratory function stable and patient connected to nasal cannula oxygen Cardiovascular status: stable and blood pressure returned to baseline Postop Assessment: no apparent nausea or vomiting Anesthetic complications: no   No notable events documented.  Last Vitals:  Vitals:   08/03/21 0921 08/03/21 1020  BP: 138/78 (!) 94/54  Pulse: 98 62  Resp: 16 20  Temp: 36.7 C (!) 36.4 C  SpO2: 96% 98%    Last Pain:  Vitals:   08/03/21 1020  TempSrc: Oral  PainSc: 0-No pain                 Rhonda [name]

## 2021-08-03 NOTE — H&P (Signed)
History of Present Illness         Patient is a 81 year old female who presents for hematochezia, loose stools, and abdominal pain. Patient has personal history of adenomatous polyps.         Last colonoscopy 11/07/2016 - Perianal skin tag, post-polypectomy scar in ascending colon, 18mm polyp removed at splenic flexure, otherwise normal, 5 year recall        Labs 07/09/2021 - Hgb 10.1, normal kidney and liver function        Patient states she has seen blood on the toilet paper, a bright red spot the size of a quarter. Patient has had 4 episodes of blood in stool and mixed in with water in the toilet in the last few weeks. She had a bowel movement yesterday with blood on the tissue and in the stool.        Pateint states she has had soft stools with a little bit of diarrhea at the end of a bowel movement since the middle of October. She will have some pain in her upper abdomen along with gas pains in the lower abdomen occasionally with a bowel movement. BMs are irregular and she will go up to a week without a BM. She takes Senokot or a fleet enema to have a bowel movement. States irregular bowel habits are due to nerve disfunction and abdominal weakness. Denies melena or hemorrhoids.        Patient has been having colonoscopies in [facility] - last done in 2018. She is due for repeat in April.         Patient states she started having blood blisters on her tongue back in March and saw dentist and PCP for this. Dr. Laurann Montana started her on pantoprazole for presumed acid reflux. She does report some acid reflux prior to starting pantoprazole that was worse at night, but the blisters on her tongue and tongue swelling have not improved. She is concerned this may be due to a recent addition of medication for autonomic failure and orthostatic hypotension - Mestinon, prescribed by cardiologist. Recommended she report this side effect to them. She denies nausea, vomiting, dysphagia.        Patient denies family history  of GI malignancy or disease. She has a history of 2 TIA's but denies any cardiovascular events in the last 6 months. She is on Eliquis for A. fib. Not diabetic.     Current Medications  Taking  dilTIAZem HCl 30 MG Tablet 1 tablet Orally if heart rate over 100 Flecainide Acetate 50 MG Tablet 1 TABLET by mouth twice a day Mestinon(Pyridostigmine Bromide) 60 MG Tablet 1/2 tablet Orally three times a day Pantoprazole Sodium 40 MG Tablet Delayed Release 1 tablet Orally Once a day Apixaban 5 MG Tablet 1 tablet Orally twice a day Calcium Gummies(Calcium-Phosphorus-Vitamin D) 250-100-500 MG-UNIT Tablet Chewable as directed Orally  Vitamin D 25 MCG (1000 UT) Tablet 1 tablet Orally once a day, Notes: PRN Vitamin B Complex - Tablet 1 tablet Orally once a day, Notes: PRN Crestor(Rosuvastatin Calcium) 5 MG Tablet 1 tablet Orally 2 times a week Vitamin B12 1000 MCG Tablet Extended Release 1 tablet Orally Once a day, Notes: PRN    Past Medical History       Hypertension, no meds.      paroxysmal atrial fibrillation, radiofrequency ablation of atrial fibrillation Hayden Rasmussen.      nonsustained atrial tachycardia, PVC's and PAC's.      Hyperlipidemia.      Basilar  migraines.      osteoporosis, Actonel since 2006-2014, alendronate 4/20-.      History of atypical chest pain.      chronic mild shortness of breath, negative Cardiolite.      seizures 1980, 7 years on Dilantin.      History of hemorrhoids.      Snoring.      Tinea versicolor.      Right ankle question injury 1992 with multiple operations and chronic pain.       history of hematuria, IVP 1990 left kidney stone, cystoscopy negative.      Mild cystocele.      Bacterial meningitis the child.      Adenomatous colon polyp, cecum,.      nephrolithiasis, 5/12 Tannen baum.      right facial numbness, question TIA versus migraine May 2012, cardiac monitoring negative for atrial fibrillation.      Bladder spasms/pain alliance.       exophthalmus,diplopia Dr. Sanda Klein, [facility].      Intraocular pressure, elevated.      right caudate nucleus CVA on CT.      OSA, turner.      Orthostatic hypotension, turner.      elevated A1C.      Proximal muscle weakness, neurology workup EMG/NCS primarily sensory peripheral neuropathy no evidence of myopathy, possibly deconditioning, Willis.      Ophtho oman/martin, urology tannenbaum(released), gi grimm, cardiology turner, ortho caffrey(as needed).      [place] Cardiologist (past), Derm- Dr Nevada Crane, Urology- Dr McDiarmid, Cards turner/Klein neuro willis ent teoh.    Surgical History        D&C x 2         breast biopsy x 2         bilateral tubal ligation         vaginal hysterectomy with anterior repair         cystoscopy with biopsy and urethra suspension         right ankle surgery, multiple,[facility]         right shoulder bone spur, [facility] February 2007        radiofrequency ablation, [facility] 2006        colonoscopy with polyp resection/ [facility] 1/12        EGD         kidney stone removal 5/12        cataract OU 2013        Colon polyps removed 09/2016      Family History  Father: deceased 12 yrs, MI, diagnosed with Coronary artery disease  Mother: deceased 42 yrs, parkinsons, dementia, colon polyps  Maternal Grand Mother: deceased, diagnosed with Breast cancer  Sister 1: alive 63 yrs, hypertension, lipids, diagnosed with Hypertension, Atrial fibrillation  Paternal aunt: alive, breast cancer  1 sister(s) . 2 son(s) - healthy.   Mother had Benign Colon Polyps No family history history colon cancer, liver disease.       Social History  General:   Tobacco use       cigarettes:  Never smoked     Tobacco history last updated  07/22/2021     Additional Findings: Tobacco Non-User  Non-smoker for personal reasons     Vaping  No no Alcohol, no.  Caffeine: yes.  no Recreational drug use.  Exercise: minimal.  Marital Status: Married.  Children: 2  children.  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  no Tobacco  Exposure.      Allergies  Erythromycin: yeast infec  Pravastatin Sodium: muscle weakness      Hospitalization/Major Diagnostic Procedure  none within past year 07/2021    Review of Systems  GI PROCEDURE:          Pacemaker/ AICD no.  Artificial heart valves no.  MI/heart attack no.  Abnormal heart rhythm YES , Atrial Fibrillation.  Angina no.  CVA YES, TIA.  Hypertension no.  Hypotension no.  Asthma, COPD no.  Sleep apnea no.  Seizure disorders no.  Artificial joints no.  Diabetes no.  Significant headaches no.  Vertigo no.  Depression/anxiety no.  Abnormal bleeding YES , Taking blood thinners, Elliquis.  Kidney Disease no.  Liver disease no.  Blood transfusion YES.          Vital Signs  Wt 164.8, Wt change -1.6 lbs, Ht 64.5, BMI 27.85, Temp 96.9, Pulse sitting 78, BP sitting 110/71.     Examination  Gastroenterology Exam:        GENERAL APPEARANCE: Well nourished elderly female in no acute distress.         RESPIRATORY Breath sounds normal. Respiration even and unlabored.         CARDIOVASCULAR Irregularly irregular rhythm, regular rate, w/o murmurs or gallops. No peripheral edema.         ABDOMEN Soft, nontender. No masses palpated. Liver and spleen not palpated, normal. Bowel sounds normal, Abdomen not distended.         RECTAL: External hemorrhoids, heme-positive stool.         EXTREMITIES: No edema, pulses intact.         SKIN Warm and dry, good turgor without rashes.         PSYCHIATRIC Alert and oriented x3, mood and affect appear normal..         NEURO: alert, normal strength and tone.      Assessments  1. Lower abdominal pain - R10.30 (Primary)  2. History of adenomatous polyp of colon - Z86.010  3. Constipation, unspecified constipation type - K59.00  4. Rectal bleeding - K62.5  5. External hemorrhoids - K64.4    Plan: Colonoscopy

## 2021-08-03 NOTE — Discharge Instructions (Signed)

## 2021-08-03 NOTE — Anesthesia Procedure Notes (Signed)
Procedure Name: MAC Date/Time: 08/03/2021 9:35 AM Performed by: Lollie Sails, CRNA Pre-anesthesia Checklist: Patient identified, Emergency Drugs available, Suction available, Patient being monitored and Timeout performed Oxygen Delivery Method: Simple face mask Placement Confirmation: positive ETCO2

## 2021-08-03 NOTE — Transfer of Care (Signed)
Immediate Anesthesia Transfer of Care Note  Patient: Rhonda [name]  Procedure(s) Performed: COLONOSCOPY WITH PROPOFOL BIOPSY POLYPECTOMY  Patient Location: PACU and Endoscopy Unit  Anesthesia Type:MAC  Level of Consciousness: awake, drowsy and responds to stimulation  Airway & Oxygen Therapy: Patient Spontanous Breathing and Patient connected to face mask oxygen  Post-op Assessment: Report given to RN and Post -op Vital signs reviewed and stable  Post vital signs: Reviewed and stable  Last Vitals:  Vitals Value Taken Time  BP 94/54 08/03/21 1020  Temp    Pulse 63 08/03/21 1021  Resp 21 08/03/21 1021  SpO2 100 % 08/03/21 1021  Vitals shown include unvalidated device data.  Last Pain:  Vitals:   08/03/21 0921  TempSrc: Oral  PainSc: 0-No pain         Complications: No notable events documented.

## 2021-08-04 ENCOUNTER — Encounter (HOSPITAL_COMMUNITY): Payer: Self-pay | Admitting: Gastroenterology

## 2021-08-04 LAB — SURGICAL PATHOLOGY

## 2021-08-10 ENCOUNTER — Ambulatory Visit (HOSPITAL_COMMUNITY)
Admission: RE | Admit: 2021-08-10 | Discharge: 2021-08-10 | Disposition: A | Discharge status: Home / Self Care | Payer: Medicare Other | Source: Ambulatory Visit | Attending: Cardiology | Admitting: Cardiology

## 2021-08-10 ENCOUNTER — Other Ambulatory Visit: Payer: Self-pay

## 2021-08-10 DIAGNOSIS — I6523 Occlusion and stenosis of bilateral carotid arteries: Secondary | ICD-10-CM | POA: Diagnosis not present

## 2021-09-14 ENCOUNTER — Ambulatory Visit
Admission: RE | Admit: 2021-09-14 | Discharge: 2021-09-14 | Disposition: A | Discharge status: Home / Self Care | Payer: Medicare Other | Source: Ambulatory Visit | Attending: Internal Medicine | Admitting: Internal Medicine

## 2021-09-14 ENCOUNTER — Other Ambulatory Visit: Payer: Self-pay | Admitting: Internal Medicine

## 2021-09-14 DIAGNOSIS — I951 Orthostatic hypotension: Secondary | ICD-10-CM | POA: Diagnosis not present

## 2021-09-14 DIAGNOSIS — R079 Chest pain, unspecified: Secondary | ICD-10-CM | POA: Diagnosis not present

## 2021-09-14 DIAGNOSIS — R0609 Other forms of dyspnea: Secondary | ICD-10-CM

## 2021-09-14 DIAGNOSIS — Z0000 Encounter for general adult medical examination without abnormal findings: Secondary | ICD-10-CM | POA: Diagnosis not present

## 2021-09-14 DIAGNOSIS — R531 Weakness: Secondary | ICD-10-CM | POA: Diagnosis not present

## 2021-09-14 DIAGNOSIS — Z1389 Encounter for screening for other disorder: Secondary | ICD-10-CM | POA: Diagnosis not present

## 2021-09-14 DIAGNOSIS — R946 Abnormal results of thyroid function studies: Secondary | ICD-10-CM | POA: Diagnosis not present

## 2021-09-15 ENCOUNTER — Other Ambulatory Visit: Payer: Self-pay

## 2021-09-15 ENCOUNTER — Encounter (HOSPITAL_COMMUNITY): Payer: Self-pay | Admitting: Emergency Medicine

## 2021-09-15 ENCOUNTER — Ambulatory Visit
Admission: RE | Admit: 2021-09-15 | Discharge: 2021-09-15 | Disposition: A | Discharge status: Home / Self Care | Payer: Medicare Other | Source: Ambulatory Visit | Attending: Internal Medicine | Admitting: Internal Medicine

## 2021-09-15 ENCOUNTER — Inpatient Hospital Stay (HOSPITAL_COMMUNITY)
Admission: EM | Admit: 2021-09-15 | Discharge: 2021-10-16 | DRG: 208 | Disposition: E | Payer: Medicare Other | Attending: Internal Medicine | Admitting: Internal Medicine

## 2021-09-15 ENCOUNTER — Other Ambulatory Visit: Payer: Self-pay | Admitting: Internal Medicine

## 2021-09-15 ENCOUNTER — Emergency Department (HOSPITAL_COMMUNITY): Payer: Medicare Other

## 2021-09-15 DIAGNOSIS — Z20822 Contact with and (suspected) exposure to covid-19: Secondary | ICD-10-CM | POA: Diagnosis present

## 2021-09-15 DIAGNOSIS — I469 Cardiac arrest, cause unspecified: Secondary | ICD-10-CM | POA: Diagnosis present

## 2021-09-15 DIAGNOSIS — R9389 Abnormal findings on diagnostic imaging of other specified body structures: Secondary | ICD-10-CM

## 2021-09-15 DIAGNOSIS — R042 Hemoptysis: Principal | ICD-10-CM | POA: Diagnosis present

## 2021-09-15 DIAGNOSIS — I7789 Other specified disorders of arteries and arterioles: Secondary | ICD-10-CM | POA: Diagnosis not present

## 2021-09-15 DIAGNOSIS — J9601 Acute respiratory failure with hypoxia: Secondary | ICD-10-CM | POA: Diagnosis not present

## 2021-09-15 DIAGNOSIS — J90 Pleural effusion, not elsewhere classified: Secondary | ICD-10-CM | POA: Diagnosis present

## 2021-09-15 DIAGNOSIS — J811 Chronic pulmonary edema: Secondary | ICD-10-CM | POA: Diagnosis not present

## 2021-09-15 DIAGNOSIS — I2510 Atherosclerotic heart disease of native coronary artery without angina pectoris: Secondary | ICD-10-CM | POA: Diagnosis not present

## 2021-09-15 DIAGNOSIS — I468 Cardiac arrest due to other underlying condition: Secondary | ICD-10-CM | POA: Diagnosis not present

## 2021-09-15 DIAGNOSIS — R0689 Other abnormalities of breathing: Secondary | ICD-10-CM | POA: Diagnosis not present

## 2021-09-15 DIAGNOSIS — I3139 Other pericardial effusion (noninflammatory): Secondary | ICD-10-CM | POA: Diagnosis not present

## 2021-09-15 DIAGNOSIS — I10 Essential (primary) hypertension: Secondary | ICD-10-CM | POA: Diagnosis not present

## 2021-09-15 DIAGNOSIS — I509 Heart failure, unspecified: Secondary | ICD-10-CM | POA: Diagnosis not present

## 2021-09-15 DIAGNOSIS — R0602 Shortness of breath: Secondary | ICD-10-CM | POA: Diagnosis not present

## 2021-09-15 DIAGNOSIS — R0600 Dyspnea, unspecified: Secondary | ICD-10-CM | POA: Diagnosis not present

## 2021-09-15 DIAGNOSIS — R6889 Other general symptoms and signs: Secondary | ICD-10-CM | POA: Diagnosis not present

## 2021-09-15 DIAGNOSIS — J929 Pleural plaque without asbestos: Secondary | ICD-10-CM | POA: Diagnosis not present

## 2021-09-15 DIAGNOSIS — Z743 Need for continuous supervision: Secondary | ICD-10-CM | POA: Diagnosis not present

## 2021-09-15 DIAGNOSIS — R079 Chest pain, unspecified: Secondary | ICD-10-CM | POA: Diagnosis not present

## 2021-09-15 LAB — RESP PANEL BY RT-PCR (FLU A&B, COVID) ARPGX2
Influenza A by PCR: NEGATIVE
Influenza B by PCR: NEGATIVE
SARS Coronavirus 2 by RT PCR: NEGATIVE

## 2021-09-15 LAB — CBC WITH DIFFERENTIAL/PLATELET
Abs Immature Granulocytes: 0.38 10*3/uL — ABNORMAL HIGH (ref 0.00–0.07)
Basophils Absolute: 0 10*3/uL (ref 0.0–0.1)
Basophils Relative: 0 %
Eosinophils Absolute: 0 10*3/uL (ref 0.0–0.5)
Eosinophils Relative: 0 %
HCT: 26.2 % — ABNORMAL LOW (ref 36.0–46.0)
Hemoglobin: 8.1 g/dL — ABNORMAL LOW (ref 12.0–15.0)
Immature Granulocytes: 4 %
Lymphocytes Relative: 33 %
Lymphs Abs: 3.4 10*3/uL (ref 0.7–4.0)
MCH: 31.2 pg (ref 26.0–34.0)
MCHC: 30.9 g/dL (ref 30.0–36.0)
MCV: 100.8 fL — ABNORMAL HIGH (ref 80.0–100.0)
Monocytes Absolute: 0.5 10*3/uL (ref 0.1–1.0)
Monocytes Relative: 4 %
Neutro Abs: 6 10*3/uL (ref 1.7–7.7)
Neutrophils Relative %: 59 %
Platelets: 329 10*3/uL (ref 150–400)
RBC: 2.6 MIL/uL — ABNORMAL LOW (ref 3.87–5.11)
RDW: 15 % (ref 11.5–15.5)
WBC: 10.2 10*3/uL (ref 4.0–10.5)
nRBC: 0.4 % — ABNORMAL HIGH (ref 0.0–0.2)

## 2021-09-15 LAB — I-STAT ARTERIAL BLOOD GAS, ED
Acid-base deficit: 18 mmol/L — ABNORMAL HIGH (ref 0.0–2.0)
Bicarbonate: 13.2 mmol/L — ABNORMAL LOW (ref 20.0–28.0)
Calcium, Ion: 1.22 mmol/L (ref 1.15–1.40)
HCT: 19 % — ABNORMAL LOW (ref 36.0–46.0)
Hemoglobin: 6.5 g/dL — CL (ref 12.0–15.0)
O2 Saturation: 79 %
Patient temperature: 98.8
Potassium: 5.4 mmol/L — ABNORMAL HIGH (ref 3.5–5.1)
Sodium: 130 mmol/L — ABNORMAL LOW (ref 135–145)
TCO2: 15 mmol/L — ABNORMAL LOW (ref 22–32)
pCO2 arterial: 66.2 mmHg (ref 32–48)
pH, Arterial: 6.91 — CL (ref 7.35–7.45)
pO2, Arterial: 73 mmHg — ABNORMAL LOW (ref 83–108)

## 2021-09-15 LAB — I-STAT CHEM 8, ED
BUN: 25 mg/dL — ABNORMAL HIGH (ref 8–23)
Calcium, Ion: 1.24 mmol/L (ref 1.15–1.40)
Chloride: 100 mmol/L (ref 98–111)
Creatinine, Ser: 1.2 mg/dL — ABNORMAL HIGH (ref 0.44–1.00)
Glucose, Bld: 232 mg/dL — ABNORMAL HIGH (ref 70–99)
HCT: 26 % — ABNORMAL LOW (ref 36.0–46.0)
Hemoglobin: 8.8 g/dL — ABNORMAL LOW (ref 12.0–15.0)
Potassium: 5.5 mmol/L — ABNORMAL HIGH (ref 3.5–5.1)
Sodium: 129 mmol/L — ABNORMAL LOW (ref 135–145)
TCO2: 22 mmol/L (ref 22–32)

## 2021-09-15 LAB — BRAIN NATRIURETIC PEPTIDE: B Natriuretic Peptide: 922.5 pg/mL — ABNORMAL HIGH (ref 0.0–100.0)

## 2021-09-15 LAB — I-STAT VENOUS BLOOD GAS, ED
Acid-base deficit: 9 mmol/L — ABNORMAL HIGH (ref 0.0–2.0)
Bicarbonate: 19.1 mmol/L — ABNORMAL LOW (ref 20.0–28.0)
Calcium, Ion: 1.25 mmol/L (ref 1.15–1.40)
HCT: 26 % — ABNORMAL LOW (ref 36.0–46.0)
Hemoglobin: 8.8 g/dL — ABNORMAL LOW (ref 12.0–15.0)
O2 Saturation: 99 %
Potassium: 5.6 mmol/L — ABNORMAL HIGH (ref 3.5–5.1)
Sodium: 129 mmol/L — ABNORMAL LOW (ref 135–145)
TCO2: 21 mmol/L — ABNORMAL LOW (ref 22–32)
pCO2, Ven: 54.4 mmHg (ref 44–60)
pH, Ven: 7.154 — CL (ref 7.25–7.43)
pO2, Ven: 162 mmHg — ABNORMAL HIGH (ref 32–45)

## 2021-09-15 LAB — BASIC METABOLIC PANEL
Anion gap: 13 (ref 5–15)
BUN: 22 mg/dL (ref 8–23)
CO2: 15 mmol/L — ABNORMAL LOW (ref 22–32)
Calcium: 9.1 mg/dL (ref 8.9–10.3)
Chloride: 98 mmol/L (ref 98–111)
Creatinine, Ser: 1.2 mg/dL — ABNORMAL HIGH (ref 0.44–1.00)
GFR, Estimated: 46 mL/min — ABNORMAL LOW (ref >60)
Glucose, Bld: 231 mg/dL — ABNORMAL HIGH (ref 70–99)
Potassium: 5.4 mmol/L — ABNORMAL HIGH (ref 3.5–5.1)
Sodium: 126 mmol/L — ABNORMAL LOW (ref 135–145)

## 2021-09-15 LAB — TROPONIN I (HIGH SENSITIVITY): Troponin I (High Sensitivity): 525 ng/L (ref <18)

## 2021-09-15 MED ORDER — FUROSEMIDE 10 MG/ML IJ SOLN: 40.0000 mg | Freq: Once | INTRAMUSCULAR | Status: DC

## 2021-09-15 MED ORDER — FENTANYL 2500MCG IN NS 250ML (10MCG/ML) PREMIX INFUSION
25.0000 ug/h | INTRAVENOUS | Status: DC
  Administered 2021-09-15 (×2): 25 ug/h via INTRAVENOUS
  Filled 2021-09-15: qty 250

## 2021-09-15 MED ORDER — NOREPINEPHRINE 4 MG/250ML-% IV SOLN
INTRAVENOUS | Status: AC
  Filled 2021-09-15: qty 250

## 2021-09-15 MED ORDER — EPINEPHRINE 1 MG/10ML IJ SOSY
PREFILLED_SYRINGE | INTRAMUSCULAR | Status: DC | PRN
Start: 2021-09-15 — End: 2021-09-16
  Administered 2021-09-15 (×3): 1 mg via INTRAVENOUS

## 2021-09-15 MED ORDER — SODIUM CHLORIDE 0.9 % IV BOLUS
1000.0000 mL | Freq: Once | INTRAVENOUS | Status: AC
  Administered 2021-09-15: 1000 mL via INTRAVENOUS

## 2021-09-15 MED ORDER — EPINEPHRINE HCL 5 MG/250ML IV SOLN IN NS
INTRAVENOUS | Status: AC
  Administered 2021-09-15: 5 ug/min
  Filled 2021-09-15: qty 250

## 2021-09-15 MED ORDER — FENTANYL BOLUS VIA INFUSION
25.0000 ug | INTRAVENOUS | Status: DC | PRN
  Administered 2021-09-15 (×2): 50 ug via INTRAVENOUS
  Administered 2021-09-15: 25 ug via INTRAVENOUS
  Filled 2021-09-15: qty 100

## 2021-09-15 MED ORDER — IOPAMIDOL (ISOVUE-300) INJECTION 61%
75.0000 mL | Freq: Once | INTRAVENOUS | Status: AC | PRN
  Administered 2021-09-15: 75 mL via INTRAVENOUS

## 2021-09-15 MED ORDER — FENTANYL CITRATE PF 50 MCG/ML IJ SOSY
PREFILLED_SYRINGE | INTRAMUSCULAR | Status: AC
  Filled 2021-09-15: qty 1

## 2021-09-15 MED ORDER — IPRATROPIUM-ALBUTEROL 0.5-2.5 (3) MG/3ML IN SOLN: 3.0000 mL | RESPIRATORY_TRACT | Status: AC

## 2021-09-15 MED ORDER — PROTHROMBIN COMPLEX CONC HUMAN 500 UNITS IV KIT
3715.0000 [IU] | PACK | Status: AC
  Administered 2021-09-15: 3715 [IU] via INTRAVENOUS
  Filled 2021-09-15: qty 3183

## 2021-09-15 NOTE — Procedures (Signed)
Diagnostic/Therapeutic Bronchoscopy Procedure Note ? ?[place]  ?[id number]  ?Dec 24, 1940 ? ?Date:10/05/2021  ?Time:10:26 PM  ? ?Provider H[place]t ? ?Procedure: Diagnostic & therapeutic Bronchoscopy ? ?Indication(s) ?Massive hemoptysis  ? ?Consent ?Emergent  ? ?Anesthesia ?None  ? ?Time Out ?Verified patient identification, verified procedure, site/side was marked, verified correct patient position, special equipment/implants available, medications/allergies/relevant history reviewed, required imaging and test results available. ? ? ?Sterile Technique ?Usual hand hygiene, masks, gowns, and gloves were used ? ? ?Procedure Description ?Bronchoscope advanced through endotracheal tube and into airway.  Thick bright read blood suctioned in ETT, 10 ml sterile saline flushed to clear. Noted blood streaking into both main stems. Right lung was notable for thin scant blood to subsegmental level throughout right upper and lower lobes. Mucosa otherwise normal appearing. Cleared easily with suctioning. Left main was noted for copious bright red blood with difficultly clearing with 20 ml sterile saline flush. Left lower was densely erythematous with copious bright red blood.   ? ?Complications/Tolerance ?Recurrent hypoxic episodes requiring bronch removal and manual bagging.  ? ? ?EBL ?200 ml  ? ?Specimen(s) ?None ? ?

## 2021-09-15 NOTE — ED Notes (Signed)
Pulse present

## 2021-09-15 NOTE — ED Notes (Signed)
Pulse check.   

## 2021-09-15 NOTE — ED Notes (Signed)
[facility] called spoke with Cissy, states patient not eligible for donation referral number [id number] ?

## 2021-09-15 NOTE — ED Triage Notes (Signed)
Patient arrived from via [facility] with shortness of breath and chest pain on and off since x-mas and worsened in the last few weeks. Patient states she was worked up at outpatient clinic by Dr. Lavone Orn at [facility] and found to have fluid in the lungs. She was told to come to the ED. EMS reports low SpO2 in the 80s on room air and patient placed on non-rebreather and SpO2 increased to 99%. Patient complaints at her arrival to ED, stating "I can't breath."  ?

## 2021-09-15 NOTE — Code Documentation (Signed)
Epi given

## 2021-09-15 NOTE — ED Provider Notes (Addendum)
[facility] Provider Note   CSN: [id number] Arrival date & time: 10/13/2021  1924     History  Chief Complaint  Patient presents with   Shortness of Breath    Chest pain     Rhonda [name] is a 81 y.o. female.  81 yo F with a chief complaints of difficulty breathing.  Has been going on since December per EMS.  Seen by her family doctor twice in the past 48 hours.  Found to have pleural effusions and sent to the ER for evaluation.     Shortness of Breath     Home Medications Prior to Admission medications   Medication Sig Start Date End Date Taking? Authorizing Provider  acetaminophen (TYLENOL) 500 MG tablet Take 500 mg by mouth every 6 (six) hours as needed for headache.    [provider]  alum & mag hydroxide-simeth (MAALOX/MYLANTA) 200-200-20 MG/5ML suspension Take 30 mLs by mouth as needed for indigestion or heartburn.    [provider]  apixaban (ELIQUIS) 5 MG TABS tablet TAKE 1 TABLET BY MOUTH  TWICE DAILY 03/24/21   Sueanne Margarita, MD  Ascorbic Acid (VITAMIN C PO) Take 1 tablet by mouth daily.    [provider]  Cholecalciferol (VITAMIN D3 PO) Take 625 mg by mouth daily.    [provider]  diltiazem (CARDIZEM) 30 MG tablet Take 1 tablet (30 mg total) by mouth every 4 (four) hours as needed (afib HR >100). Patient not taking: Reported on 07/27/2021 01/26/21   Sueanne Margarita, MD  flecainide (TAMBOCOR) 50 MG tablet TAKE 1 TABLET BY MOUTH  TWICE DAILY 06/22/21   Sueanne Margarita, MD  latanoprost (XALATAN) 0.005 % ophthalmic solution Place 1 drop into both eyes at bedtime. 01/25/21   [provider]  loratadine (CLARITIN) 10 MG tablet Take 1 tablet by mouth daily as needed (allergies).     [provider]  pantoprazole (PROTONIX) 40 MG tablet Take 40 mg by mouth daily. 02/26/21   [provider]  pyridostigmine (MESTINON) 60 MG tablet Take 0.5 tablets (30 mg total) by mouth 3  (three) times daily. 05/28/21   Sueanne Margarita, MD  rosuvastatin (CRESTOR) 5 MG tablet TAKE 1 TABLET BY MOUTH 2 DAYS A WEEK OR AS TOLERATED 03/31/21   Sueanne Margarita, MD  senna (SENOKOT) 8.6 MG tablet Take 1 tablet by mouth daily as needed (Constipation).    [provider]  vitamin B-12 (CYANOCOBALAMIN) 1000 MCG tablet Take 1,000 mcg by mouth daily.    [provider]      Allergies    Erythromycin and Pravastatin sodium    Review of Systems   Review of Systems  Respiratory:  Positive for shortness of breath.    Physical Exam Updated Vital Signs BP (!) 80/51    Pulse (!) 117    Temp 98.8 F (37.1 C) (Oral)    Resp (!) 24    Ht _0  (1.626 m)    Wt 72.6 kg    SpO2 (!) 69%    BMI 27.46 kg/m  Physical Exam Vitals and nursing note reviewed.  Constitutional:      General: She is not in acute distress.    Appearance: She is well-developed. She is not diaphoretic.  HENT:     Head: Normocephalic and atraumatic.  Eyes:     Pupils: Pupils are equal, round, and reactive to light.  Cardiovascular:     Rate and Rhythm:  Normal rate and regular rhythm.     Heart sounds: No murmur heard.   No friction rub. No gallop.  Pulmonary:     Effort: Pulmonary effort is normal.     Breath sounds: Examination of the right-lower field reveals decreased breath sounds. Examination of the left-lower field reveals decreased breath sounds. Decreased breath sounds present. No wheezing or rales.  Abdominal:     General: There is no distension.     Palpations: Abdomen is soft.     Tenderness: There is no abdominal tenderness.  Musculoskeletal:        General: No tenderness.     Cervical back: Normal range of motion and neck supple.  Skin:    General: Skin is warm and dry.  Neurological:     Mental Status: She is alert and oriented to person, place, and time.  Psychiatric:        Behavior: Behavior normal.    ED Results / Procedures / Treatments   Labs (all labs ordered are listed,  but only abnormal results are displayed) Labs Reviewed  CBC WITH DIFFERENTIAL/PLATELET - Abnormal; Notable for the following components:      Result Value   RBC 2.60 (*)    Hemoglobin 8.1 (*)    HCT 26.2 (*)    MCV 100.8 (*)    nRBC 0.4 (*)    Abs Immature Granulocytes 0.38 (*)    All other components within normal limits  BASIC METABOLIC PANEL - Abnormal; Notable for the following components:   Sodium 126 (*)    Potassium 5.4 (*)    CO2 15 (*)    Glucose, Bld 231 (*)    Creatinine, Ser 1.20 (*)    GFR, Estimated 46 (*)    All other components within normal limits  BRAIN NATRIURETIC PEPTIDE - Abnormal; Notable for the following components:   B Natriuretic Peptide 922.5 (*)    All other components within normal limits  I-STAT VENOUS BLOOD GAS, ED - Abnormal; Notable for the following components:   pH, Ven 7.154 (*)    pO2, Ven 162 (*)    Bicarbonate 19.1 (*)    TCO2 21 (*)    Acid-base deficit 9.0 (*)    Sodium 129 (*)    Potassium 5.6 (*)    HCT 26.0 (*)    Hemoglobin 8.8 (*)    All other components within normal limits  I-STAT CHEM 8, ED - Abnormal; Notable for the following components:   Sodium 129 (*)    Potassium 5.5 (*)    BUN 25 (*)    Creatinine, Ser 1.20 (*)    Glucose, Bld 232 (*)    Hemoglobin 8.8 (*)    HCT 26.0 (*)    All other components within normal limits  I-STAT ARTERIAL BLOOD GAS, ED - Abnormal; Notable for the following components:   pH, Arterial 6.910 (*)    pCO2 arterial 66.2 (*)    pO2, Arterial 73 (*)    Bicarbonate 13.2 (*)    TCO2 15 (*)    Acid-base deficit 18.0 (*)    Sodium 130 (*)    Potassium 5.4 (*)    HCT 19.0 (*)    Hemoglobin 6.5 (*)    All other components within normal limits  TROPONIN I (HIGH SENSITIVITY) - Abnormal; Notable for the following components:   Troponin I (High Sensitivity) 525 (*)    All other components within normal limits  RESP PANEL BY RT-PCR (FLU A&B, COVID) ARPGX2  LACTIC ACID, PLASMA  LACTIC ACID,  PLASMA  TROPONIN I (HIGH SENSITIVITY)    EKG EKG Interpretation  Date/Time:  Wednesday September 15 2021 19:30:24 EST Ventricular Rate:  93 PR Interval:  201 QRS Duration: 111 QT Interval:  373 QTC Calculation: 464 R Axis:   -2 Text Interpretation: Sinus rhythm Borderline T abnormalities, lateral leads No significant change since last tracing Confirmed by Deno Etienne [phone]) on 10/02/2021 7:44:30 PM  Radiology DG Chest 2 View  Result Date: 09/14/2021 CLINICAL DATA:  Dyspnea on exertion. EXAM: CHEST - 2 VIEW COMPARISON:  03/20/2018 FINDINGS: Bibasilar collapse/consolidation is associated with small bilateral pleural effusions. Hyperexpansion suggests underlying emphysema. Cardiopericardial silhouette is at upper limits of normal for size. Bones are diffusely demineralized. IMPRESSION: Bibasilar collapse/consolidation with small bilateral pleural effusions. Electronically Signed   By: Misty Stanley M.D.   On: 09/14/2021 12:41   CT CHEST W CONTRAST  Result Date: 10/12/2021 CLINICAL DATA:  Dyspnea on exertion for 1 week, abnormal chest x-ray EXAM: CT CHEST WITH CONTRAST TECHNIQUE: Multidetector CT imaging of the chest was performed during intravenous contrast administration. RADIATION DOSE REDUCTION: This exam was performed according to the departmental dose-optimization program which includes automated exposure control, adjustment of the mA and/or kV according to patient size and/or use of iterative reconstruction technique. CONTRAST:  56m ISOVUE-300 IOPAMIDOL (ISOVUE-300) INJECTION 61% COMPARISON:  Chest radiographs, 09/14/2021 FINDINGS: Cardiovascular: Aortic atherosclerosis. Cardiomegaly. Scattered left coronary artery calcifications. No pericardial effusion. Enlargement of the main pulmonary artery, measuring up to 3.9 cm in caliber. Mediastinum/Nodes: Prominent mediastinal lymph nodes. Thyroid gland, trachea, and esophagus demonstrate no significant findings. Lungs/Pleura: Moderate right, small  left pleural effusions and associated atelectasis or consolidation. Interlobular septal thickening throughout. Scattered small ground-glass and heterogeneous opacities throughout the lungs. Upper Abdomen: No acute abnormality. Musculoskeletal: No chest wall abnormality. No suspicious osseous lesions identified. IMPRESSION: 1. Moderate right, small left pleural effusions and associated atelectasis or consolidation. Interlobular septal thickening throughout. Findings are most consistent with edema. 2. Scattered small ground-glass and heterogeneous opacities throughout the lungs, nonspecific and infectious or inflammatory. 3. Cardiomegaly and coronary artery disease. 4. Enlargement of the main pulmonary artery, measuring up to 3.9 cm in caliber, as can be seen in pulmonary hypertension. Aortic Atherosclerosis (ICD10-I70.0). Electronically Signed   By: ADelanna AhmadiM.D.   On: 10/07/2021 12:41   DG Chest Portable 1 View  Result Date: 09/20/2021 CLINICAL DATA:  Shortness of breath, chest pain, status post code blue EXAM: PORTABLE CHEST 1 VIEW COMPARISON:  09/14/2021 FINDINGS: Transverse diameter of heart is increased. There is marked prominence of central pulmonary vessels. Increased interstitial and alveolar densities seen in both parahilar regions. Bilateral pleural effusions are seen. There is air soft tissue interface in the periphery of right upper lung fields suggesting small right pneumothorax. There is a small questionable air soft tissue interface in the lateral aspect of left upper lung fields. Tip of endotracheal tube is 3.2 cm above the carina. Enteric tube is noted traversing the esophagus. IMPRESSION: CHF.  Pulmonary edema.  Small bilateral pleural effusions. There is air soft tissue interface in the periphery of right upper lung fields suggesting possible right pneumothorax. Less likely possibility would be an artifact due to skin fold. There is questionable small pneumothorax in the lateral aspect of  left upper lung fields. When the patient's clinical condition permits upright or semi upright images of chest should be considered. Electronically Signed   By: PElmer PickerM.D.   On: 09/24/2021 20:43  Procedures Procedure Name: Intubation Date/Time: 09-25-21 2:57 PM Performed by: Deno Etienne, DO Pre-anesthesia Checklist: Patient identified, Patient being monitored, Emergency Drugs available, Timeout performed and Suction available Oxygen Delivery Method: Ambu bag Preoxygenation: Pre-oxygenation with 100% oxygen Ventilation: Mask ventilation without difficulty Laryngoscope Size: Mac and 4 Grade View: Grade I Tube size: 7.5 mm Number of attempts: 1 Placement Confirmation: ETT inserted through vocal cords under direct vision, CO2 detector and Breath sounds checked- equal and bilateral Secured at: 26 cm Tube secured with: ETT holder Dental Injury: Bloody posterior oropharynx  Difficulty Due To: Difficulty was anticipated Future Recommendations: Recommend- induction with short-acting agent, and alternative techniques readily available       EMERGENCY DEPARTMENT [place] CARDIAC EXAM "Study: Limited Ultrasound of the Heart and Pericardium"  INDICATIONS:Cardiac arrest Multiple views of the heart and pericardium were obtained in real-time with a multi-frequency probe.  PERFORMED MW:NUUVOZ IMAGES ARCHIVED?: Yes LIMITATIONS:  Emergent procedure VIEWS USED: Subcostal 4 chamber and Apical 4 chamber  INTERPRETATION: Cardiac activity present, Pericardial effusion present, Cardiac tamponade absent, Probable elevated CVP, Decreased contractility, and IVC dilated   Medications Ordered in ED Medications  furosemide (LASIX) injection 40 mg (has no administration in time range)  ipratropium-albuterol (DUONEB) 0.5-2.5 (3) MG/3ML nebulizer solution 3 mL (has no administration in time range)  fentaNYL (SUBLIMAZE) 50 MCG/ML injection (has no administration in time range)  fentaNYL 257mg in NS  2561m(1062mml) infusion-PREMIX (0 mcg/hr Intravenous Stopped 10/11/2021 2205)  fentaNYL (SUBLIMAZE) bolus via infusion 25-100 mcg (25 mcg Intravenous Bolus from Bag 10/08/2021 2104)  norepinephrine (LEVOPHED) 4-5 MG/250ML-% infusion SOLN (has no administration in time range)  prothrombin complex conc human (KCENTRA) IVPB 3,715 Units (has no administration in time range)  EPINEPHrine (ADRENALIN) 1 MG/10ML injection (1 mg Intravenous Given 09/21/2021 2203)  sodium chloride 0.9 % bolus 1,000 mL (0 mLs Intravenous Stopped 10/05/2021 2137)  sodium chloride 0.9 % bolus 1,000 mL (0 mLs Intravenous Stopped 09/25/2021 2137)  EPINEPHrine NaCl 5-0.9 MG/250ML-% premix infusion (0 mcg/min  Stopped 09/21/2021 2205)    ED Course/ Medical Decision Making/ A&P                           Medical Decision Making Amount and/or Complexity of Data Reviewed Labs: ordered. Radiology: ordered.  Risk Prescription drug management. Decision regarding hospitalization.   [age] F with a chief complaints of difficulty breathing.  This been going on for a few months per EMS.  Patient is very short of breath on arrival and tells me that she is having trouble breathing.  Her oxygen is 100% on room air.  Patient with diminished breath sounds in the bases.  Her records were reviewed and she did have a CT scan of the chest with contrast done today with bilateral pleural effusions.  I left the room to put in an order set and I was notified the patient had done worse when I got back to the room the patient was apneic.  CPR was initiated.  ET tube was placed.  Patient had a profuse amount of bleeding from the airway.  Not a traumatic placement of the tube.  Copious amounts of blood was suctioned from the airway with improvement.  She unfortunately had recurrence of pulselessness.  Seem to improve with deep tracheal suction each time.  She had all in all about 35 to 40 minutes of CPR multiple epinephrine doses.  I discussed the case with critical care who  came and evaluated  the patient at bedside.  Attempted bedside bronc but without significant improvement.  Eventually the code was called at 2205.  CRITICAL CARE Performed by: Cecilio Asper   Total critical care time: 80 minutes  Critical care time was exclusive of separately billable procedures and treating other patients.  Critical care was necessary to treat or prevent imminent or life-threatening deterioration.  Critical care was time spent personally by me on the following activities: development of treatment plan with patient and/or surrogate as well as nursing, discussions with consultants, evaluation of patient's response to treatment, examination of patient, obtaining history from patient or surrogate, ordering and performing treatments and interventions, ordering and review of laboratory studies, ordering and review of radiographic studies, pulse oximetry and re-evaluation of patient's condition.  Cardiopulmonary Resuscitation (CPR) Procedure Note Directed/Performed by: Cecilio Asper I personally directed ancillary staff and/or performed CPR in an effort to regain return of spontaneous circulation and to maintain cardiac, neuro and systemic perfusion.   The patients results and plan were reviewed and discussed.   Any x-rays performed were independently reviewed by myself.   Differential diagnosis were considered with the presenting HPI.  Medications  furosemide (LASIX) injection 40 mg (has no administration in time range)  ipratropium-albuterol (DUONEB) 0.5-2.5 (3) MG/3ML nebulizer solution 3 mL (has no administration in time range)  fentaNYL (SUBLIMAZE) 50 MCG/ML injection (has no administration in time range)  fentaNYL 2575mg in NS 2572m(1035mml) infusion-PREMIX (0 mcg/hr Intravenous Stopped 09/24/2021 2205)  fentaNYL (SUBLIMAZE) bolus via infusion 25-100 mcg (25 mcg Intravenous Bolus from Bag 10/01/2021 2104)  norepinephrine (LEVOPHED) 4-5 MG/250ML-% infusion SOLN  (has no administration in time range)  prothrombin complex conc human (KCENTRA) IVPB 3,715 Units (has no administration in time range)  EPINEPHrine (ADRENALIN) 1 MG/10ML injection (1 mg Intravenous Given 10/02/2021 2203)  sodium chloride 0.9 % bolus 1,000 mL (0 mLs Intravenous Stopped 10/11/2021 2137)  sodium chloride 0.9 % bolus 1,000 mL (0 mLs Intravenous Stopped 10/10/2021 2137)  EPINEPHrine NaCl 5-0.9 MG/250ML-% premix infusion (0 mcg/min  Stopped 09/30/2021 2205)    Vitals:   09/25/2021 2154 10/02/2021 2155 09/17/2021 2156 09/29/2021 2157  BP: (!) 61/36   (!) 80/51  Pulse:      Resp: _0 (!) 24  Temp:      TempSrc:      SpO2:      Weight:      Height:        Final diagnoses:  Massive hemoptysis          Final Clinical Impression(s) / ED Diagnoses Final diagnoses:  Massive hemoptysis    Rx / DC Orders ED Discharge Orders     None         FloDeno EtienneO 10/13/2021 [id number] 03/March 11, [id number]

## 2021-09-15 NOTE — Code Documentation (Signed)
Compression stopped and no pulse at 2205 ?

## 2021-09-15 NOTE — Progress Notes (Signed)
Called to admit patient with acute hypoxic resp failure secondary to massive hemoptysis. Came in with complaints of SOB thought to be secondary to bilateral pleural effusions noted at outpt office earlier today, she  rapidly decompensated requiring intubation and had ~ 34ml of hemoptysis suctioned at that time. During this time she had several rounds of ACLS for roughly 25 min with ROSC.  ? ?Upon our arrival patient in bradycardic arrest, ACLS in process. ROSC obtained following several rounds of epi. She was already receiving 50 levo and 10 epi gtt. I suctioned again around 100 ml of BRB from ETT with slight improvement in oxygenation. Following ROSC saturations remained low in 70s to mid 80s. I performed emergent bronch (see procedure note) and noted right side with scant BRB, easily suctioned to clear throughout, LLL to be diffusely erythematous, coated in BRB that could not be suctioned to clear. In addition to high dose vasopressor gtts she required multiple pushes of epi. Patient placed on left side with improvement in oxygenation however suffered subsequent bradycardic that progressed to asystolic arrest. See code sheet for complete details. Despite appropriate ACLS we were unable to obtain ROSC.  ? ?TOD 2205 ? ?GEN: no response to verbal or painful stimuli   ?HENT: Pupils blown, non responsive  ?CV: absent cardiac sounds  ?RESP: Absent breath sounds  ? ?Multiple attempts have been made to reach husband. He was not located in the ED and is not answering phone.  ? ?Total critical care time: 65 minutes ?Critical care time was exclusive of separately billable procedures and treating other patients. ?Critical care was necessary to treat or prevent imminent or life-threatening deterioration. ?Critical care was time spent personally by me on the following activities: development of treatment plan with patient and/or surrogate as well as nursing, discussions with consultants, evaluation of patient's response to  treatment, examination of patient, obtaining history from patient or surrogate, ordering and performing treatments and interventions, ordering and review of laboratory studies, ordering and review of radiographic studies, pulse oximetry and re-evaluation of patient's condition. ? ?Samara Deist  ?PCCM  ?

## 2021-09-16 ENCOUNTER — Ambulatory Visit: Payer: Medicare Other | Admitting: Neurology

## 2021-09-16 MED ORDER — EPINEPHRINE 1 MG/10ML IJ SOSY
PREFILLED_SYRINGE | INTRAMUSCULAR | Status: DC | PRN
Start: 2021-09-15 — End: 2021-09-16
  Administered 2021-09-15: 1 mg via INTRAVENOUS

## 2021-09-16 MED ORDER — AMIODARONE HCL 150 MG/3ML IV SOLN
INTRAVENOUS | Status: DC | PRN
  Administered 2021-09-15: 150 mg via INTRAVENOUS

## 2021-09-20 ENCOUNTER — Other Ambulatory Visit: Payer: Self-pay | Admitting: Cardiology

## 2021-10-16 NOTE — Progress Notes (Signed)
?   10/10/2021 2340  ?Clinical Encounter Type  ?Visited With Family  ?Visit Type Initial;Spiritual support;Social support;Other (Comment) ?(Patient died)  ?Referral From Physician  ?Consult/Referral To None  ? ?Chaplain was in ED visiting another patient and was asked to assist the DR and Nurse with informing the family that the patient had died.  Dr informed the family that their family member had died and her death was called at October 19, 2203. Family was in shock and disbelief. Chaplain stayed with family as they worked through the way forward. Spouse struggled to understand and shared a life review of his family. A common way to help with the understanding. The spouse of patient lost his soul mate today of 100 years. He was deeply grieved. Spouse was accompanied by son and daughter in law.  ?Family asked that patient be released to [facility] [address] [place] [address].  ? ?Danice Goltz ?Chaplain Resident ?[facility]  ?[phone] ?

## 2021-10-16 DEATH — deceased

## 2021-11-17 ENCOUNTER — Ambulatory Visit: Payer: Medicare Other | Admitting: Cardiology

## 2023-11-04 IMAGING — CT CT CHEST W/ CM
2 of 5 series · 15 of 36 positions shown, 18 images · IV contrast (agent unspecified)
Comparison: Chest radiographs, 09/14/2021

CLINICAL DATA: Dyspnea on exertion for 1 week, abnormal chest x-ray

EXAM:
CT CHEST WITH CONTRAST
TECHNIQUE: Multidetector CT imaging of the chest was performed during
intravenous contrast administration.

[Series 4: chest 2.00 br40 s3 · coronal · 0.55mm/px · 3 of 155 slices shown]
[im 31/155  lung]
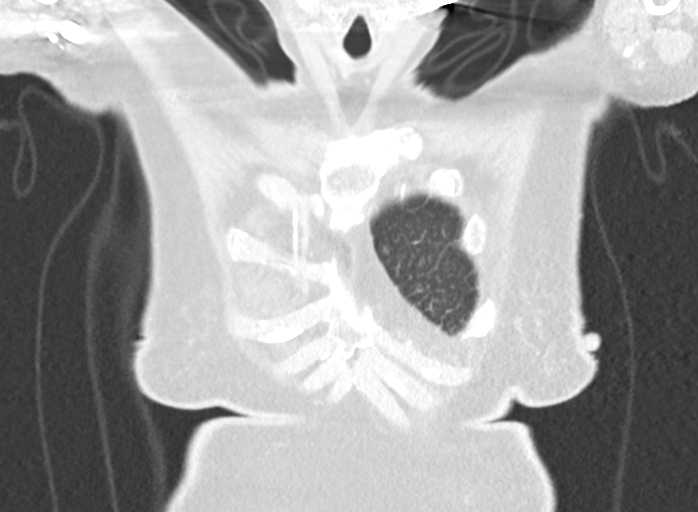
[im 62/155  lung]
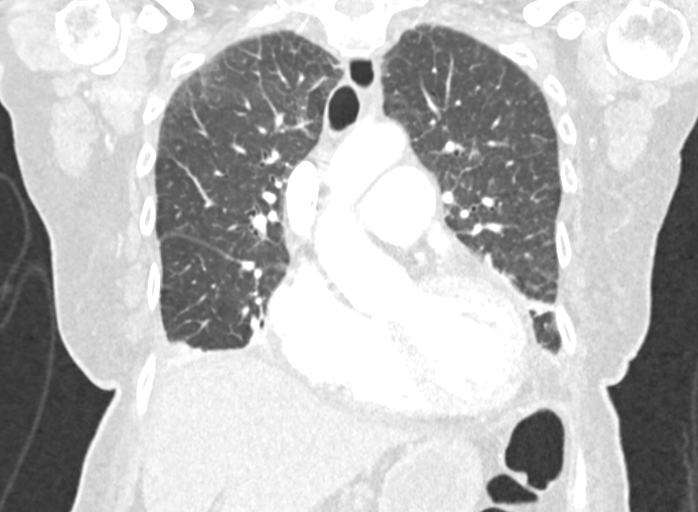
[im 93/155  lung]
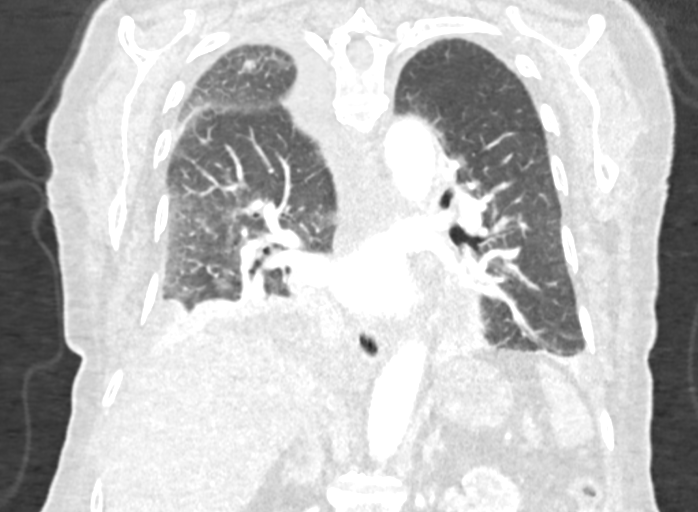

[Series 10: chest 1.00 br40 s3 · axial · 0.68mm/px · z∈[+1456,+1698]mm · 12 of 351 slices shown, 15 images]
[im 24/351  mediastinal]
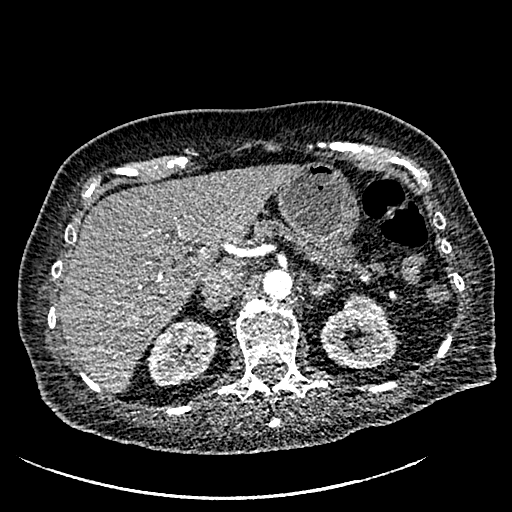
[im 24/351  lung]
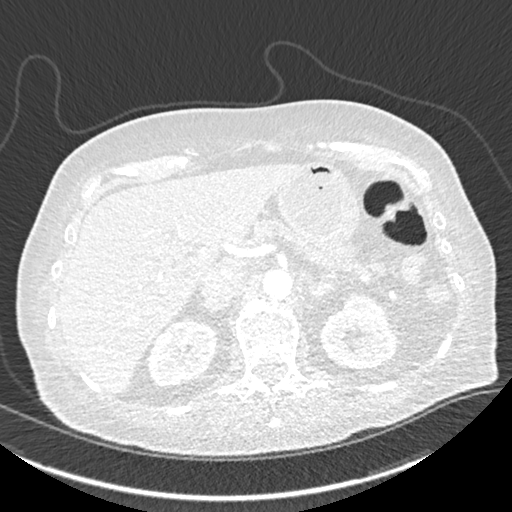
[im 47/351  lung]
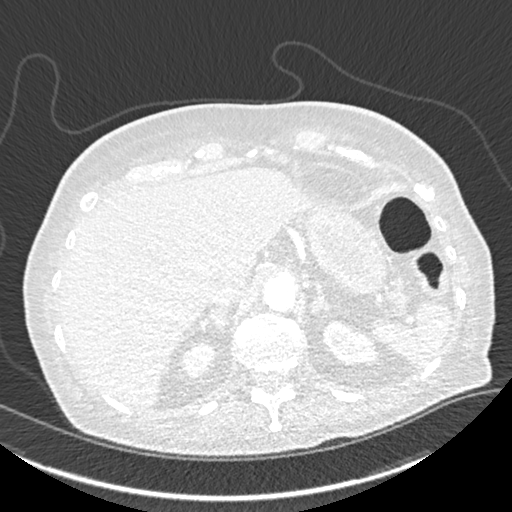
[im 71/351  lung]
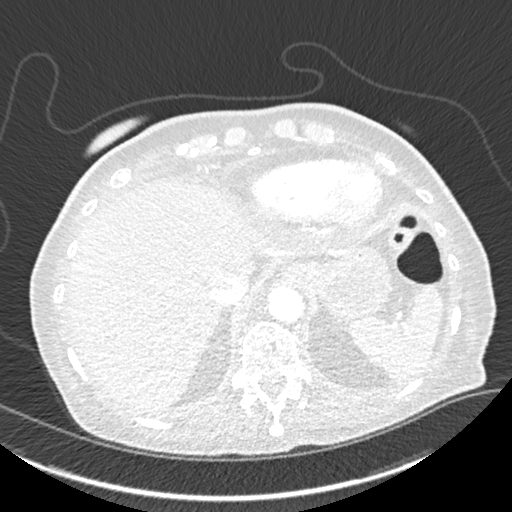
[im 117/351  lung]
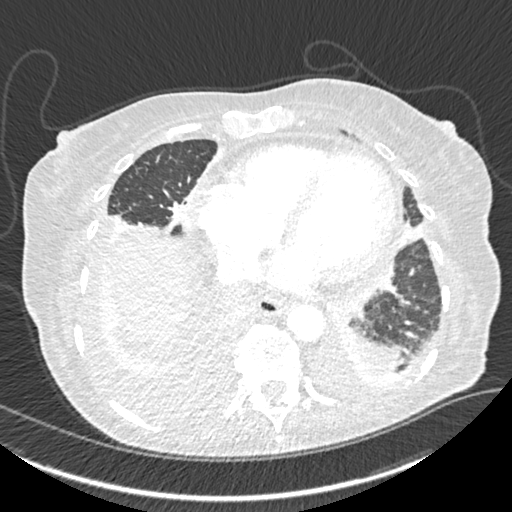
[im 141/351  mediastinal]
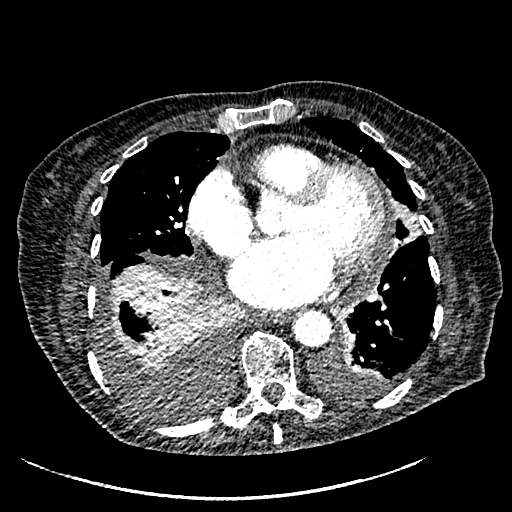
[im 141/351  lung]
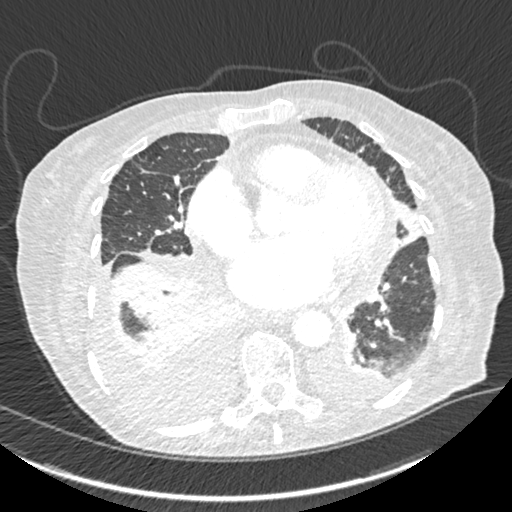
[im 164/351  lung]
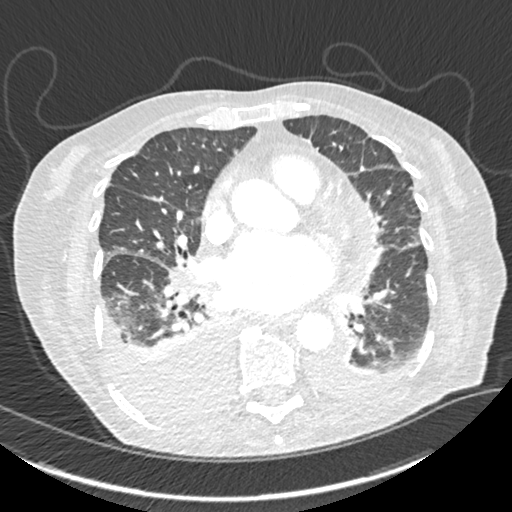
[im 187/351  lung]
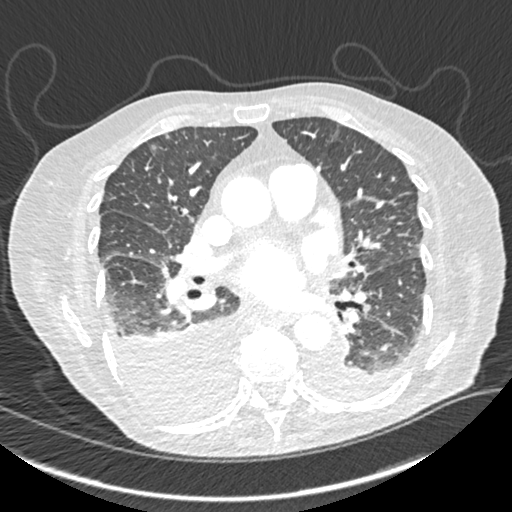
[im 211/351  lung]
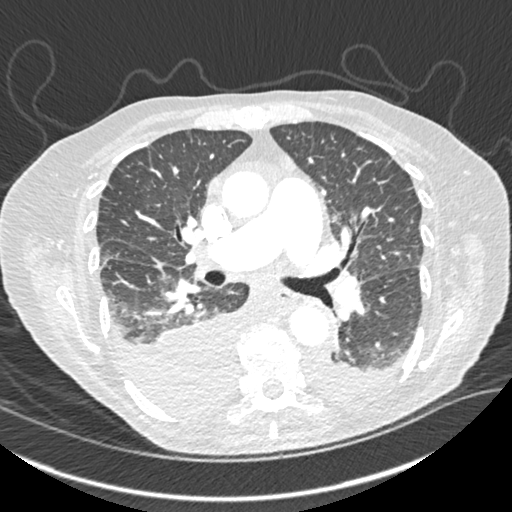
[im 234/351  mediastinal]
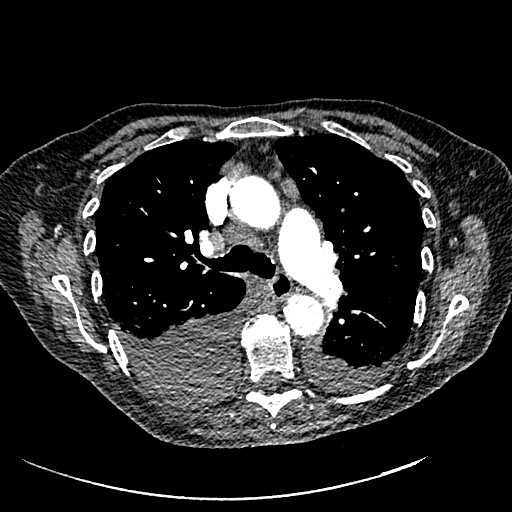
[im 234/351  lung]
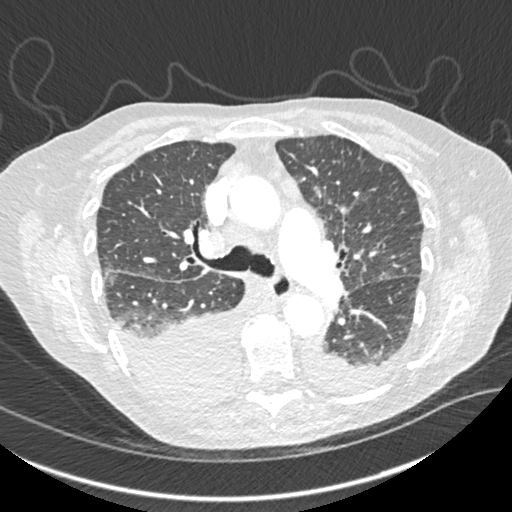
[im 281/351  lung]
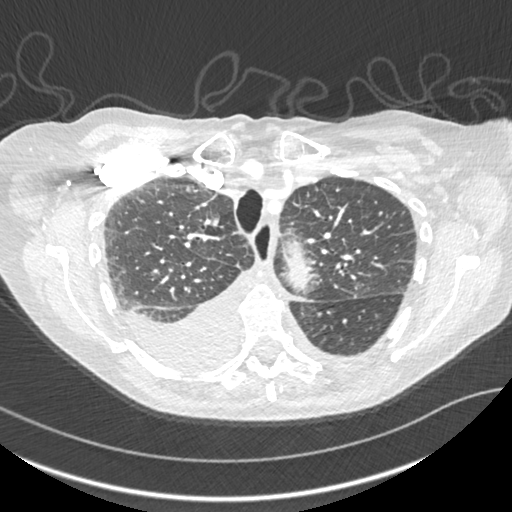
[im 304/351  lung]
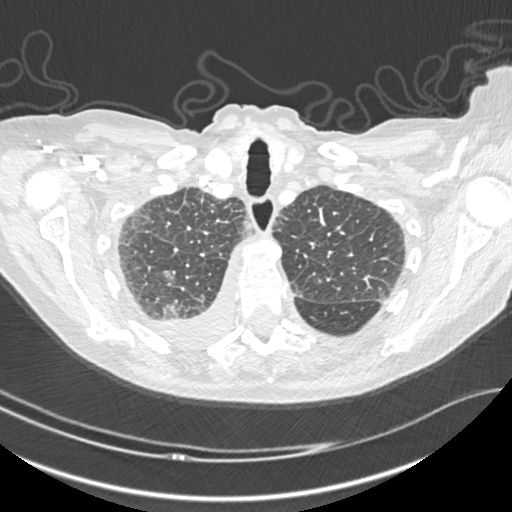
[im 327/351  lung]
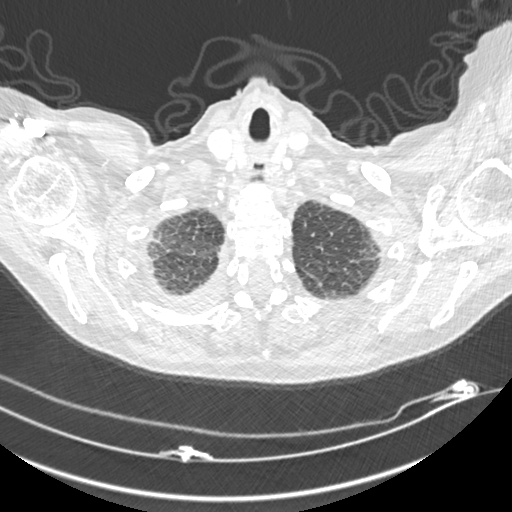

[15 of 36 positions shown; findings below may reference images not displayed]

RADIATION DOSE REDUCTION: This exam was performed according to the
departmental dose-optimization program which includes automated
exposure control, adjustment of the mA and/or kV according to
patient size and/or use of iterative reconstruction technique.

CONTRAST:  75mL XO29AU-W00 IOPAMIDOL (XO29AU-W00) INJECTION 61%
FINDINGS: Cardiovascular: Aortic atherosclerosis. Cardiomegaly. Scattered left
coronary artery calcifications. No pericardial effusion. Enlargement
of the main pulmonary artery, measuring up to 3.9 cm in caliber.

Mediastinum/Nodes: Prominent mediastinal lymph nodes. Thyroid gland,
trachea, and esophagus demonstrate no significant findings.

Lungs/Pleura: Moderate right, small left pleural effusions and
associated atelectasis or consolidation. Interlobular septal
thickening throughout. Scattered small ground-glass and
heterogeneous opacities throughout the lungs.

Upper Abdomen: No acute abnormality.

Musculoskeletal: No chest wall abnormality. No suspicious osseous
lesions identified.
IMPRESSION: 1. Moderate right, small left pleural effusions and associated
atelectasis or consolidation. Interlobular septal thickening
throughout. Findings are most consistent with edema.
2. Scattered small ground-glass and heterogeneous opacities
throughout the lungs, nonspecific and infectious or inflammatory.
3. Cardiomegaly and coronary artery disease.
4. Enlargement of the main pulmonary artery, measuring up to 3.9 cm
in caliber, as can be seen in pulmonary hypertension.

Aortic Atherosclerosis (I8WSS-L31.1).

## 2023-11-04 IMAGING — DX DG CHEST 1V PORT
1 series · 1 of 1 positions shown · non-contrast
Comparison: 09/14/2021

CLINICAL DATA: Shortness of breath, chest pain, status post code
blue

EXAM:
PORTABLE CHEST 1 VIEW

[chest ap]
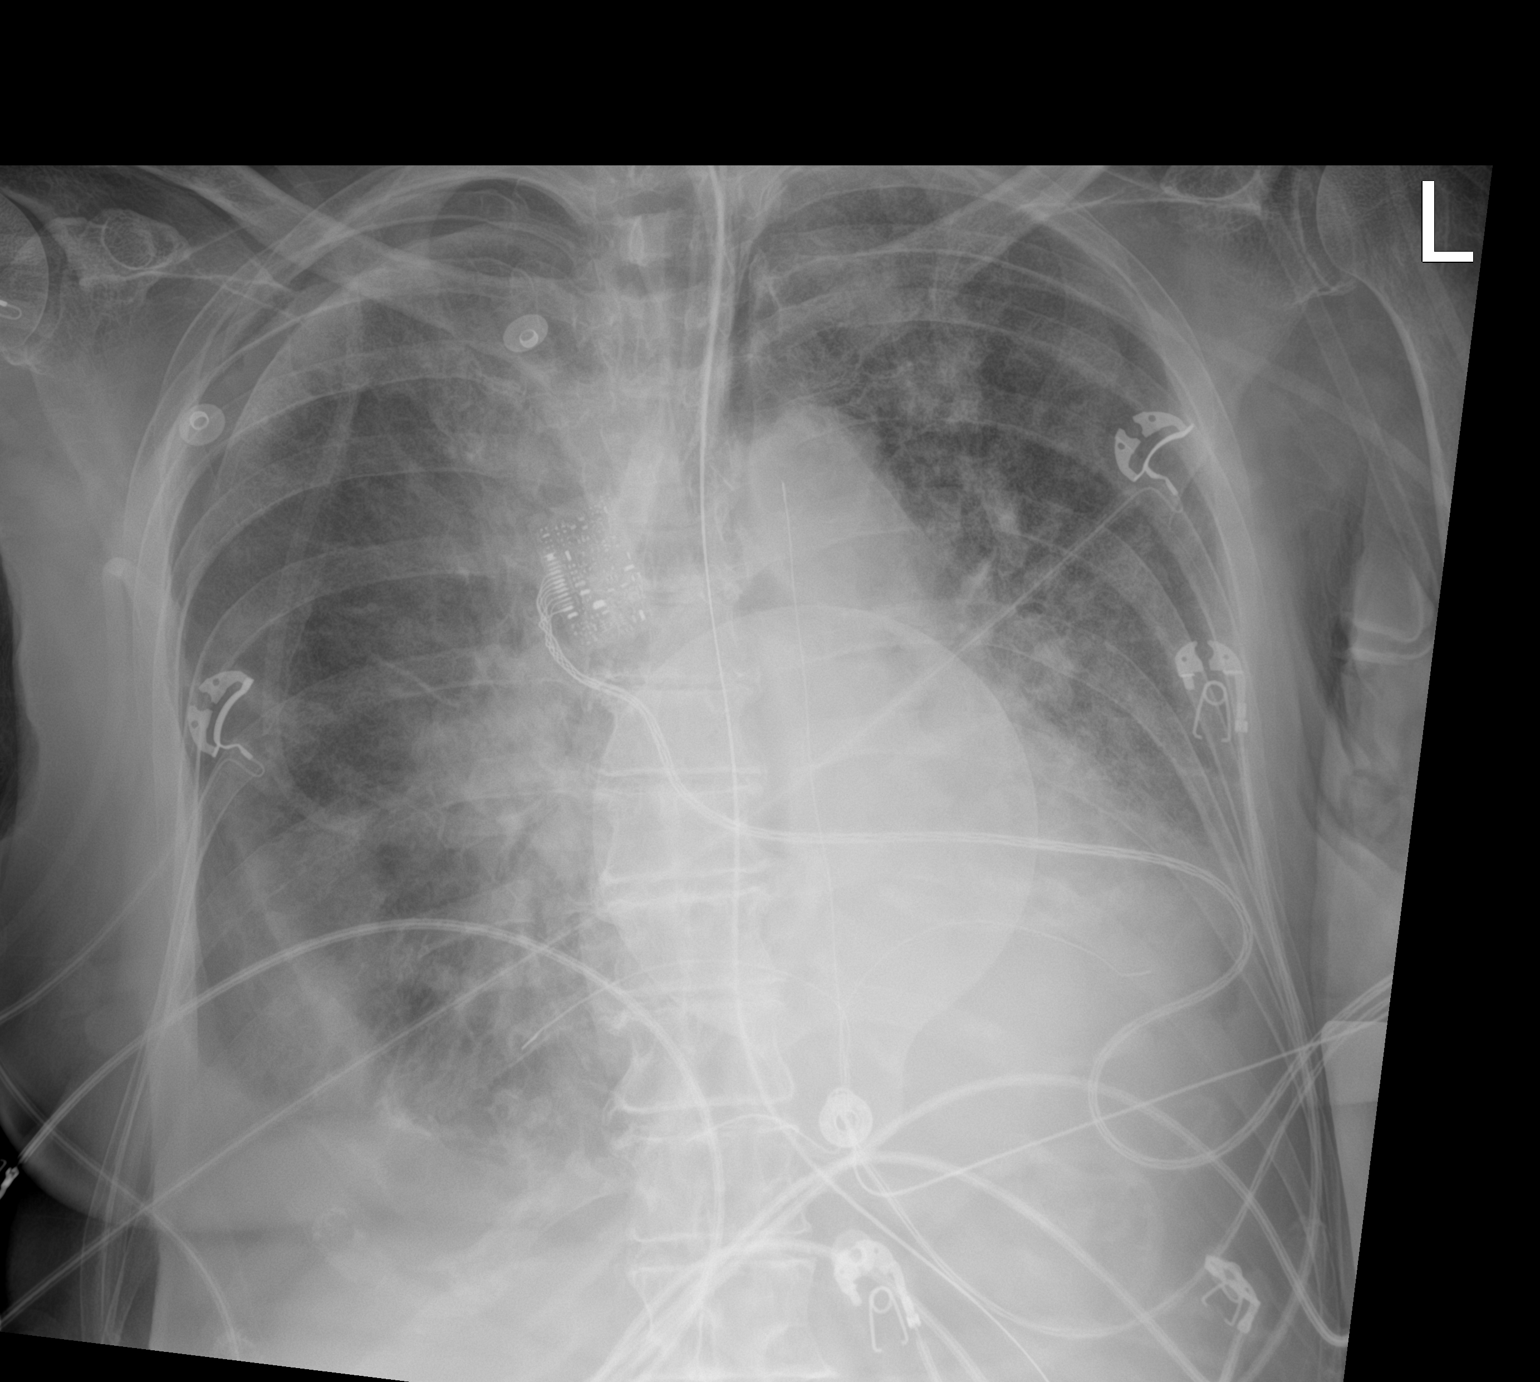

[1 of 1 positions shown; findings below may reference images not displayed]

FINDINGS: Transverse diameter of heart is increased. There is marked
prominence of central pulmonary vessels. Increased interstitial and
alveolar densities seen in both parahilar regions. Bilateral pleural
effusions are seen. There is air soft tissue interface in the
periphery of right upper lung fields suggesting small right
pneumothorax. There is a small questionable air soft tissue
interface in the lateral aspect of left upper lung fields. Tip of
endotracheal tube is 3.2 cm above the carina. Enteric tube is noted
traversing the esophagus.
IMPRESSION: CHF.  Pulmonary edema.  Small bilateral pleural effusions.

There is air soft tissue interface in the periphery of right upper
lung fields suggesting possible right pneumothorax. Less likely
possibility would be an artifact due to skin fold. There is
questionable small pneumothorax in the lateral aspect of left upper
lung fields. When the patient's clinical condition permits upright
or semi upright images of chest should be considered.
